# Patient Record
Sex: Male | Born: 1960 | Race: White | Hispanic: No | Marital: Married | Smoking: Former smoker
Health system: Southern US, Community
[De-identification: ages and names within clinical notes are randomized; demographics above are authoritative.]

## PROBLEM LIST (undated history)

## (undated) DIAGNOSIS — E7800 Pure hypercholesterolemia, unspecified: Secondary | ICD-10-CM

## (undated) DIAGNOSIS — J45909 Unspecified asthma, uncomplicated: Secondary | ICD-10-CM

## (undated) DIAGNOSIS — I2510 Atherosclerotic heart disease of native coronary artery without angina pectoris: Secondary | ICD-10-CM

## (undated) DIAGNOSIS — I10 Essential (primary) hypertension: Secondary | ICD-10-CM

## (undated) DIAGNOSIS — M109 Gout, unspecified: Secondary | ICD-10-CM

## (undated) HISTORY — DX: Atherosclerotic heart disease of native coronary artery without angina pectoris: I25.10

---

## 2020-12-18 ENCOUNTER — Other Ambulatory Visit: Payer: Self-pay

## 2020-12-18 ENCOUNTER — Observation Stay (HOSPITAL_BASED_OUTPATIENT_CLINIC_OR_DEPARTMENT_OTHER): Payer: BC Managed Care – PPO

## 2020-12-18 ENCOUNTER — Emergency Department (HOSPITAL_COMMUNITY): Payer: BC Managed Care – PPO

## 2020-12-18 ENCOUNTER — Encounter (HOSPITAL_COMMUNITY): Payer: Self-pay

## 2020-12-18 ENCOUNTER — Inpatient Hospital Stay (HOSPITAL_COMMUNITY)
Admission: EM | Admit: 2020-12-18 | Discharge: 2020-12-21 | DRG: 247 | Disposition: A | Discharge status: Home / Self Care | Payer: BC Managed Care – PPO | Attending: Cardiology | Admitting: Cardiology

## 2020-12-18 DIAGNOSIS — G4733 Obstructive sleep apnea (adult) (pediatric): Secondary | ICD-10-CM | POA: Diagnosis not present

## 2020-12-18 DIAGNOSIS — R079 Chest pain, unspecified: Secondary | ICD-10-CM | POA: Diagnosis not present

## 2020-12-18 DIAGNOSIS — Z87891 Personal history of nicotine dependence: Secondary | ICD-10-CM

## 2020-12-18 DIAGNOSIS — Z8249 Family history of ischemic heart disease and other diseases of the circulatory system: Secondary | ICD-10-CM | POA: Diagnosis not present

## 2020-12-18 DIAGNOSIS — I493 Ventricular premature depolarization: Secondary | ICD-10-CM

## 2020-12-18 DIAGNOSIS — E876 Hypokalemia: Secondary | ICD-10-CM | POA: Diagnosis not present

## 2020-12-18 DIAGNOSIS — E785 Hyperlipidemia, unspecified: Secondary | ICD-10-CM | POA: Diagnosis not present

## 2020-12-18 DIAGNOSIS — I214 Non-ST elevation (NSTEMI) myocardial infarction: Secondary | ICD-10-CM

## 2020-12-18 DIAGNOSIS — Z20822 Contact with and (suspected) exposure to covid-19: Secondary | ICD-10-CM | POA: Diagnosis present

## 2020-12-18 DIAGNOSIS — J45909 Unspecified asthma, uncomplicated: Secondary | ICD-10-CM | POA: Diagnosis not present

## 2020-12-18 DIAGNOSIS — I10 Essential (primary) hypertension: Secondary | ICD-10-CM | POA: Diagnosis not present

## 2020-12-18 DIAGNOSIS — Z955 Presence of coronary angioplasty implant and graft: Secondary | ICD-10-CM

## 2020-12-18 DIAGNOSIS — E7800 Pure hypercholesterolemia, unspecified: Secondary | ICD-10-CM | POA: Diagnosis not present

## 2020-12-18 DIAGNOSIS — R202 Paresthesia of skin: Secondary | ICD-10-CM

## 2020-12-18 DIAGNOSIS — I25110 Atherosclerotic heart disease of native coronary artery with unstable angina pectoris: Secondary | ICD-10-CM

## 2020-12-18 DIAGNOSIS — Z880 Allergy status to penicillin: Secondary | ICD-10-CM | POA: Diagnosis not present

## 2020-12-18 DIAGNOSIS — M109 Gout, unspecified: Secondary | ICD-10-CM | POA: Diagnosis not present

## 2020-12-18 DIAGNOSIS — R072 Precordial pain: Secondary | ICD-10-CM

## 2020-12-18 DIAGNOSIS — R0789 Other chest pain: Secondary | ICD-10-CM | POA: Diagnosis present

## 2020-12-18 HISTORY — DX: Unspecified asthma, uncomplicated: J45.909

## 2020-12-18 HISTORY — DX: Pure hypercholesterolemia, unspecified: E78.00

## 2020-12-18 HISTORY — DX: Essential (primary) hypertension: I10

## 2020-12-18 HISTORY — DX: Gout, unspecified: M10.9

## 2020-12-18 LAB — MAGNESIUM: Magnesium: 2.5 mg/dL — ABNORMAL HIGH (ref 1.7–2.4)

## 2020-12-18 LAB — CBC WITH DIFFERENTIAL/PLATELET
Abs Immature Granulocytes: 0.03 10*3/uL (ref 0.00–0.07)
Basophils Absolute: 0.1 10*3/uL (ref 0.0–0.1)
Basophils Relative: 1 %
Eosinophils Absolute: 0.4 10*3/uL (ref 0.0–0.5)
Eosinophils Relative: 5 %
HCT: 49 % (ref 39.0–52.0)
Hemoglobin: 16.9 g/dL (ref 13.0–17.0)
Immature Granulocytes: 0 %
Lymphocytes Relative: 25 %
Lymphs Abs: 1.9 10*3/uL (ref 0.7–4.0)
MCH: 29.8 pg (ref 26.0–34.0)
MCHC: 34.5 g/dL (ref 30.0–36.0)
MCV: 86.3 fL (ref 80.0–100.0)
Monocytes Absolute: 0.8 10*3/uL (ref 0.1–1.0)
Monocytes Relative: 11 %
Neutro Abs: 4.5 10*3/uL (ref 1.7–7.7)
Neutrophils Relative %: 58 %
Platelets: 142 10*3/uL — ABNORMAL LOW (ref 150–400)
RBC: 5.68 MIL/uL (ref 4.22–5.81)
RDW: 13.5 % (ref 11.5–15.5)
WBC: 7.6 10*3/uL (ref 4.0–10.5)
nRBC: 0 % (ref 0.0–0.2)

## 2020-12-18 LAB — RESP PANEL BY RT-PCR (FLU A&B, COVID) ARPGX2
Influenza A by PCR: NEGATIVE
Influenza B by PCR: NEGATIVE
SARS Coronavirus 2 by RT PCR: NEGATIVE

## 2020-12-18 LAB — ECHOCARDIOGRAM COMPLETE
AR max vel: 2.18 cm2
AV Area VTI: 2.15 cm2
AV Area mean vel: 2.1 cm2
AV Mean grad: 8.5 mmHg
AV Peak grad: 17.7 mmHg
Ao pk vel: 2.11 m/s
Area-P 1/2: 3.33 cm2
Height: 71 in
MV VTI: 2.37 cm2
S' Lateral: 3.4 cm
Weight: 4320 oz

## 2020-12-18 LAB — COMPREHENSIVE METABOLIC PANEL
ALT: 29 U/L (ref 0–44)
AST: 31 U/L (ref 15–41)
Albumin: 4.5 g/dL (ref 3.5–5.0)
Alkaline Phosphatase: 102 U/L (ref 38–126)
Anion gap: 10 (ref 5–15)
BUN: 18 mg/dL (ref 6–20)
CO2: 25 mmol/L (ref 22–32)
Calcium: 9.5 mg/dL (ref 8.9–10.3)
Chloride: 101 mmol/L (ref 98–111)
Creatinine, Ser: 0.9 mg/dL (ref 0.61–1.24)
GFR, Estimated: >60 mL/min (ref >60)
Glucose, Bld: 126 mg/dL — ABNORMAL HIGH (ref 70–99)
Potassium: 3.3 mmol/L — ABNORMAL LOW (ref 3.5–5.1)
Sodium: 136 mmol/L (ref 135–145)
Total Bilirubin: 0.7 mg/dL (ref 0.3–1.2)
Total Protein: 8.2 g/dL — ABNORMAL HIGH (ref 6.5–8.1)

## 2020-12-18 LAB — HIV ANTIBODY (ROUTINE TESTING W REFLEX): HIV Screen 4th Generation wRfx: NONREACTIVE

## 2020-12-18 LAB — TROPONIN I (HIGH SENSITIVITY)
Troponin I (High Sensitivity): 31 ng/L — ABNORMAL HIGH (ref <18)
Troponin I (High Sensitivity): 234 ng/L (ref <18)
Troponin I (High Sensitivity): 601 ng/L (ref <18)
Troponin I (High Sensitivity): 7987 ng/L (ref <18)

## 2020-12-18 LAB — LIPASE, BLOOD: Lipase: 51 U/L (ref 11–51)

## 2020-12-18 MED ORDER — ASPIRIN EC 81 MG PO TBEC
81.0000 mg | DELAYED_RELEASE_TABLET | Freq: Every day | ORAL | Status: DC
  Administered 2020-12-19 – 2020-12-21 (×2): 81 mg via ORAL
  Filled 2020-12-18 (×2): qty 1

## 2020-12-18 MED ORDER — ASPIRIN 325 MG PO TABS
325.0000 mg | ORAL_TABLET | Freq: Once | ORAL | Status: AC
  Administered 2020-12-18: 325 mg via ORAL
  Filled 2020-12-18: qty 1

## 2020-12-18 MED ORDER — HEPARIN BOLUS VIA INFUSION
4000.0000 [IU] | Freq: Once | INTRAVENOUS | Status: AC
  Administered 2020-12-18: 4000 [IU] via INTRAVENOUS

## 2020-12-18 MED ORDER — SODIUM CHLORIDE 0.9% FLUSH
3.0000 mL | Freq: Two times a day (BID) | INTRAVENOUS | Status: DC
  Administered 2020-12-18: 3 mL via INTRAVENOUS

## 2020-12-18 MED ORDER — SODIUM CHLORIDE 0.9 % WEIGHT BASED INFUSION
1.0000 mL/kg/h | INTRAVENOUS | Status: DC
  Administered 2020-12-19: 1 mL/kg/h via INTRAVENOUS

## 2020-12-18 MED ORDER — SODIUM CHLORIDE 0.9 % IV SOLN: 250.0000 mL | INTRAVENOUS | Status: DC | PRN

## 2020-12-18 MED ORDER — ASPIRIN 81 MG PO CHEW
81.0000 mg | CHEWABLE_TABLET | ORAL | Status: AC
  Administered 2020-12-19: 81 mg via ORAL
  Filled 2020-12-18: qty 1

## 2020-12-18 MED ORDER — IOHEXOL 350 MG/ML SOLN
100.0000 mL | Freq: Once | INTRAVENOUS | Status: AC | PRN
  Administered 2020-12-18: 100 mL via INTRAVENOUS

## 2020-12-18 MED ORDER — BENAZEPRIL HCL 20 MG PO TABS
20.0000 mg | ORAL_TABLET | Freq: Every day | ORAL | Status: DC
  Administered 2020-12-19: 20 mg via ORAL
  Filled 2020-12-18 (×3): qty 1

## 2020-12-18 MED ORDER — ACETAMINOPHEN 325 MG PO TABS
650.0000 mg | ORAL_TABLET | ORAL | Status: DC | PRN
  Administered 2020-12-18: 650 mg via ORAL
  Filled 2020-12-18: qty 2

## 2020-12-18 MED ORDER — ALUM & MAG HYDROXIDE-SIMETH 200-200-20 MG/5ML PO SUSP
15.0000 mL | Freq: Once | ORAL | Status: AC
  Administered 2020-12-18: 15 mL via ORAL
  Filled 2020-12-18: qty 30

## 2020-12-18 MED ORDER — ONDANSETRON HCL 4 MG/2ML IJ SOLN
4.0000 mg | Freq: Once | INTRAMUSCULAR | Status: AC
  Administered 2020-12-18: 4 mg via INTRAVENOUS
  Filled 2020-12-18: qty 2

## 2020-12-18 MED ORDER — ALLOPURINOL 300 MG PO TABS
300.0000 mg | ORAL_TABLET | Freq: Every day | ORAL | Status: DC
  Administered 2020-12-19 – 2020-12-21 (×2): 300 mg via ORAL
  Filled 2020-12-18 (×2): qty 1

## 2020-12-18 MED ORDER — SODIUM CHLORIDE 0.9 % WEIGHT BASED INFUSION
3.0000 mL/kg/h | INTRAVENOUS | Status: DC
  Administered 2020-12-19: 3 mL/kg/h via INTRAVENOUS

## 2020-12-18 MED ORDER — ASPIRIN 81 MG PO CHEW: 324.0000 mg | CHEWABLE_TABLET | ORAL | Status: DC

## 2020-12-18 MED ORDER — ONDANSETRON HCL 4 MG/2ML IJ SOLN: 4.0000 mg | Freq: Four times a day (QID) | INTRAMUSCULAR | Status: DC | PRN

## 2020-12-18 MED ORDER — NITROGLYCERIN 0.4 MG SL SUBL: 0.4000 mg | SUBLINGUAL_TABLET | SUBLINGUAL | Status: DC | PRN

## 2020-12-18 MED ORDER — ASPIRIN 81 MG PO CHEW
324.0000 mg | CHEWABLE_TABLET | Freq: Once | ORAL | Status: AC
  Administered 2020-12-18: 324 mg via ORAL
  Filled 2020-12-18: qty 4

## 2020-12-18 MED ORDER — HEPARIN (PORCINE) 25000 UT/250ML-% IV SOLN
1400.0000 [IU]/h | INTRAVENOUS | Status: DC
  Administered 2020-12-18: 1250 [IU]/h via INTRAVENOUS
  Filled 2020-12-18 (×2): qty 250

## 2020-12-18 MED ORDER — POTASSIUM CHLORIDE CRYS ER 20 MEQ PO TBCR
40.0000 meq | EXTENDED_RELEASE_TABLET | Freq: Every day | ORAL | Status: AC
  Administered 2020-12-18 – 2020-12-19 (×2): 40 meq via ORAL
  Filled 2020-12-18 (×3): qty 2

## 2020-12-18 MED ORDER — SODIUM CHLORIDE 0.9% FLUSH: 3.0000 mL | INTRAVENOUS | Status: DC | PRN

## 2020-12-18 MED ORDER — ASPIRIN 300 MG RE SUPP: 300.0000 mg | RECTAL | Status: DC

## 2020-12-18 MED ORDER — PANTOPRAZOLE SODIUM 40 MG IV SOLR
40.0000 mg | Freq: Once | INTRAVENOUS | Status: AC
  Administered 2020-12-18: 40 mg via INTRAVENOUS
  Filled 2020-12-18: qty 40

## 2020-12-18 MED ORDER — AMLODIPINE BESYLATE 10 MG PO TABS
10.0000 mg | ORAL_TABLET | Freq: Every day | ORAL | Status: DC
  Administered 2020-12-19: 10 mg via ORAL
  Filled 2020-12-18: qty 1

## 2020-12-18 MED ORDER — ATORVASTATIN CALCIUM 40 MG PO TABS
40.0000 mg | ORAL_TABLET | Freq: Every day | ORAL | Status: DC
  Administered 2020-12-18: 40 mg via ORAL
  Filled 2020-12-18: qty 1

## 2020-12-18 NOTE — ED Notes (Signed)
Nurse continues to get a hold of Dr for troponin results, via chat, and paging .

## 2020-12-18 NOTE — Progress Notes (Signed)
ANTICOAGULATION CONSULT NOTE - Initial Consult  Pharmacy Consult for heparin Indication: chest pain/ACS/STEMI  Allergies  Allergen Reactions   Penicillins     Mouth swells and gets short of breath    Patient Measurements: Height: 5\' 11"  (180.3 cm) Weight: 122.5 kg (270 lb) IBW/kg (Calculated) : 75.3 HEPARIN DW (KG): 102.6   Vital Signs: Temp: 97.9 F (36.6 C) (07/11 0928) Temp Source: Oral (07/11 0928) BP: 177/99 (07/11 1430) Pulse Rate: 51 (07/11 1430)  Labs: Recent Labs    12/18/20 0950 12/18/20 1345  HGB 16.9  --   HCT 49.0  --   PLT 142*  --   CREATININE 0.90  --   TROPONINIHS 31* 234*    Estimated Creatinine Clearance: 117.8 mL/min (by C-G formula based on SCr of 0.9 mg/dL).   Medical History: Past Medical History:  Diagnosis Date   Asthma    Gout    Hypercholesterolemia    Hypertension     Medications:  See med rec  Assessment: Nicholas Andersen 60 yo male history of hyperlipidemia, HTN, former tobacco, family history of MI in several paternal uncles presents with chest pain. He has chest tightness with feeling very clammy, numbness radiating down his right arm, some associated SOB. Patient not on any oral anticoagulants. Pharmacy asked to start heparin  Goal of Therapy:  Heparin level 0.3-0.7 units/ml Monitor platelets by anticoagulation protocol: Yes   Plan:  Give 4000 units bolus x 1 Start heparin infusion at 1250 units/hr Check anti-Xa level in ~6 hours and daily while on heparin Continue to monitor H&H and platelets  46, BS Elder Cyphers, BCPS Clinical Pharmacist Pager [phone] 12/18/2020,3:40 PM

## 2020-12-18 NOTE — Progress Notes (Signed)
  Echocardiogram 2D Echocardiogram has been performed.  Carolyne Fiscal 12/18/2020, 4:30 PM

## 2020-12-18 NOTE — ED Notes (Addendum)
Date and time results received: 12/18/20 & 1438   Test: troponin  Critical Value: 234  Name of Provider Notified: Dr. Jacqulyn Bath   Orders Received? Or Actions Taken?: notified, cardiology paged

## 2020-12-18 NOTE — ED Notes (Signed)
Dr. Wyline Mood to return call to Dr. Jacqulyn Bath

## 2020-12-18 NOTE — Plan of Care (Signed)
  Problem: Education: Goal: Knowledge of General Education information will improve Description Including pain rating scale, medication(s)/side effects and non-pharmacologic comfort measures Outcome: Progressing   

## 2020-12-18 NOTE — ED Triage Notes (Signed)
Pt woke up at 3 am with indigestion and took 2 tums and his bp meds. Pt did go back to sleep . Pt woke up this morning and ate breakfast . After breakfast pt began tingling in arms, pain in center chest , back right shoulder blades hurt. Pt has excessive indigestion at this time.

## 2020-12-18 NOTE — ED Notes (Signed)
Echo in process at this time 

## 2020-12-18 NOTE — ED Notes (Signed)
Date and time results received: 12/18/20 & 1820   Test: troponin  Critical Value: 601  Name of Provider Notified:  Dr. Effie Shy  Orders Received? Or Actions Taken?: Notified

## 2020-12-18 NOTE — H&P (Signed)
Cardiology Consultation:   Patient ID: Nicholas Andersen MRN: [id number]; DOB: 04-Oct-1960  Admit date: 12/18/2020 Date of Consult: 12/18/2020  PCP:  System, Provider Not In   [facility] Providers Cardiologist:  None      Patient Profile:   Nicholas Andersen is a 60 y.o. male with a hx of HTN, hyperlipidemia, prior tobacco history who is being seen 12/18/2020 for the evaluation of chest pain at the request of Dr Jacqulyn Bath.  History of Present Illness:   Nicholas Andersen 60 yo male history of hyperlipidemia, HTN, former tobacco, family history of MI in several paternal uncles presents with chest pain  Symptoms started around 3AM. Initially burning like pain midchest, felt clammy. He thought it was heart burn, took some tums and symptoms eased off over time, went back to sleep. Up again few hours later with same symptoms, took a protonix and waited some time and symptoms resolved, went back to bed. Woke up, went to breakfast with friends, Big breakfast with eggs/ham etc. Afterwards started having frequent belching. Later had new onset of a 5/10 chest tightness with feeling very clammy, numbness radiating down his right arm, some associated SOB.    K 3.3 Cr 0.90 BUN 18 Lipase 51 WBC 7.6 Hgb 16.9 Plt 142 Trop 31-->234   EKG SR, PVCs CXR no acute process CT head negative CTA Chest no dissection Past Medical History:  Diagnosis Date   Asthma    Gout    Hypercholesterolemia    Hypertension     History reviewed. No pertinent surgical history.    Inpatient Medications: Scheduled Meds:  Continuous Infusions:  PRN Meds:   Allergies:    Allergies  Allergen Reactions   Penicillins     Mouth swells and gets short of breath    Social History:   Social History   Socioeconomic History   Marital status: Married    Spouse name: Not on file   Number of children: Not on file   Years of education: Not on file   Highest education level: Not on file  Occupational History   Not on file   Tobacco Use   Smoking status: Former    Packs/day: 1.00    Years: 15.00    Pack years: 15.00    Types: Cigarettes    Quit date: 1990    Years since quitting: 32.5    Passive exposure: Never   Smokeless tobacco: Never  Vaping Use   Vaping Use: Never used  Substance and Sexual Activity   Alcohol use: Yes    Alcohol/week: 10.0 standard drinks    Types: 10 Cans of beer per week    Comment: weekly   Drug use: Not Currently   Sexual activity: Yes    Birth control/protection: None  Other Topics Concern   Not on file  Social History Narrative   Not on file   Social Determinants of Health   Financial Resource Strain: Not on file  Food Insecurity: Not on file  Transportation Needs: Not on file  Physical Activity: Not on file  Stress: Not on file  Social Connections: Not on file  Intimate Partner Violence: Not on file    Family History:   Multiple paternal uncles with MIs in the early 68s  ROS:  Please see the history of present illness.  All other ROS reviewed and negative.     Physical Exam/Data:   Vitals:   12/18/20 1230 12/18/20 1300 12/18/20 1400 12/18/20 1430  BP: (!) 172/93 (!) 160/101 (!) 152/85 Marland Kitchen)  177/99  Pulse: (!) 58 (!) 57 (!) 53 (!) 51  Resp: 11 13 17 12   Temp:      TempSrc:      SpO2: 99% 94% 97% 99%  Weight:      Height:       No intake or output data in the 24 hours ending 12/18/20 1539 Last 3 Weights 12/18/2020  Weight (lbs) 270 lb  Weight (kg) 122.471 kg     Body mass index is 37.66 kg/m.  General:  Well nourished, well developed, in no acute distress HEENT: normal Lymph: no adenopathy Neck: no JVD Endocrine:  No thryomegaly Vascular: No carotid bruits; FA pulses 2+ bilaterally without bruits  Cardiac:  normal S1, S2; RRR; 2/6 systolic murmur apex Lungs:  clear to auscultation bilaterally, no wheezing, rhonchi or rales  Abd: soft, nontender, no hepatomegaly  Ext: no edema Musculoskeletal:  No deformities, BUE and BLE strength normal and  equal Skin: warm and dry  Neuro:  CNs 2-12 intact, no focal abnormalities noted Psych:  Normal affect      Laboratory Data:  High Sensitivity Troponin:   Recent Labs  Lab 12/18/20 0950 12/18/20 1345  TROPONINIHS 31* 234*     Chemistry Recent Labs  Lab 12/18/20 0950  NA 136  K 3.3*  CL 101  CO2 25  GLUCOSE 126*  BUN 18  CREATININE 0.90  CALCIUM 9.5  GFRNONAA >60  ANIONGAP 10    Recent Labs  Lab 12/18/20 0950  PROT 8.2*  ALBUMIN 4.5  AST 31  ALT 29  ALKPHOS 102  BILITOT 0.7   Hematology Recent Labs  Lab 12/18/20 0950  WBC 7.6  RBC 5.68  HGB 16.9  HCT 49.0  MCV 86.3  MCH 29.8  MCHC 34.5  RDW 13.5  PLT 142*   BNPNo results for input(s): BNP, PROBNP in the last 168 hours.  DDimer No results for input(s): DDIMER in the last 168 hours.   Radiology/Studies:  CT Head Wo Contrast  Result Date: 12/18/2020 CLINICAL DATA:  Bilateral arm tingling. EXAM: CT HEAD WITHOUT CONTRAST TECHNIQUE: Contiguous axial images were obtained from the base of the skull through the vertex without intravenous contrast. COMPARISON:  None. FINDINGS: Brain: No evidence of acute infarction, hemorrhage, hydrocephalus, extra-axial collection or mass lesion/mass effect. Mild generalized cerebral atrophy. Vascular: Atherosclerotic vascular calcification of the carotid siphons. No hyperdense vessel. Skull: Normal. Negative for fracture or focal lesion. Sinuses/Orbits: Extensive bilateral paranasal sinus disease. The orbits are unremarkable. Other: None. IMPRESSION: 1. No acute intracranial abnormality. 2. Extensive bilateral paranasal sinus disease. Electronically Signed   By: Obie DredgeWilliam T Derry M.D.   On: 12/18/2020 12:14   DG Chest Portable 1 View  Result Date: 12/18/2020 CLINICAL DATA:  Chest pain, pain in center of chest with bilateral arm tingling. EXAM: PORTABLE CHEST 1 VIEW COMPARISON:  None FINDINGS: EKG leads project over the chest. Trachea is midline. Cardiomediastinal contours and  hilar structures are normal. Lungs are clear.  No sign of effusion or pneumothorax. On limited assessment there is no acute skeletal process. IMPRESSION: No acute cardiopulmonary disease. Electronically Signed   By: Donzetta KohutGeoffrey  Wile M.D.   On: 12/18/2020 10:08   CT Angio Chest/Abd/Pel for Dissection W and/or Wo Contrast  Result Date: 12/18/2020 CLINICAL DATA:  Acute onset central chest and right shoulder blade pain. EXAM: CT ANGIOGRAPHY CHEST, ABDOMEN AND PELVIS TECHNIQUE: Non-contrast CT of the chest was initially obtained. Multidetector CT imaging through the chest, abdomen and pelvis was performed using the standard  protocol during bolus administration of intravenous contrast. Multiplanar reconstructed images and MIPs were obtained and reviewed to evaluate the vascular anatomy. CONTRAST:  OMNIPAQUE IOHEXOL 350 MG/ML SOLN COMPARISON:  Chest x-ray from same day. FINDINGS: CTA CHEST FINDINGS Cardiovascular: Preferential opacification of the thoracic aorta. No evidence of thoracic aortic aneurysm or dissection. Normal heart size. Three-vessel coronary artery calcification. No pericardial effusion. No central pulmonary embolism. Mediastinum/Nodes: No enlarged mediastinal, hilar, or axillary lymph nodes. Thyroid gland, trachea, and esophagus demonstrate no significant findings. Lungs/Pleura: Lungs are clear. No pleural effusion or pneumothorax. 4 mm subpleural nodule in the superior segment of the left lower lobe (series 9, image 74). Musculoskeletal: No chest wall abnormality. No acute or significant osseous findings. Review of the MIP images confirms the above findings. CTA ABDOMEN AND PELVIS FINDINGS VASCULAR Aorta: Normal caliber aorta without aneurysm, dissection, vasculitis or significant stenosis. Mild atherosclerotic calcification. Celiac: Patent. 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 SMA: Patent without evidence of aneurysm, dissection,  vasculitis or significant stenosis. Renals: Both renal arteries are patent without evidence of aneurysm, dissection, vasculitis, fibromuscular dysplasia or significant stenosis. IMA: Patent without evidence of aneurysm, dissection, vasculitis or significant stenosis. Inflow: Patent without evidence of aneurysm, dissection, vasculitis or significant stenosis. Veins: No obvious venous abnormality within the limitations of this arterial phase study. Review of the MIP images confirms the above findings. NON-VASCULAR Hepatobiliary: Diffusely decreased liver density without focal abnormality. The gallbladder is unremarkable. No biliary dilatation. Pancreas: Unremarkable. No pancreatic ductal dilatation or surrounding inflammatory changes. Spleen: Normal in size without focal abnormality. Adrenals/Urinary Tract: The adrenal glands are unremarkable. 1.2 cm simple cyst in the central midpole of the right kidney. No renal calculi or hydronephrosis. Mild circumferential bladder wall thickening is likely related to under distension. Stomach/Bowel: Stomach is within normal limits. Appendix appears normal. No evidence of bowel wall thickening, distention, or inflammatory changes. Few scattered colonic diverticula. Lymphatic: No enlarged abdominal or pelvic lymph nodes. Reproductive: Prostate is unremarkable. Other: No abdominal wall hernia or abnormality. No abdominopelvic ascites. No pneumoperitoneum. Musculoskeletal: No acute or significant osseous findings. Review of the MIP images confirms the above findings. IMPRESSION: Vascular: 1. No evidence of aortic dissection or aneurysm. 2. 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 3. Aortic Atherosclerosis (ICD10-I70.0). Chest: 1.  No acute intrathoracic process. 2. 4 mm subpleural nodule in the left lower lobe. No follow-up needed if patient is low-risk. Non-contrast chest CT can be considered in 12  months if patient is high-risk. This recommendation follows the consensus statement: Guidelines for Management of Incidental Pulmonary Nodules Detected on CT Images: From the Fleischner Society 2017; Radiology 2017; 284:228-243. Abdomen pelvis: 1.  No acute intra-abdominal process. 2. Hepatic steatosis. Electronically Signed   By: Obie Dredge M.D.   On: 12/18/2020 12:27     Assessment and Plan:   Chest pain/elevated troponin/NSTEMI - initial symptoms were more suggestive of GI etiology with burning midchest pain, frequent belching - later symptoms of central chest tightness, SOB, diaphoretic more worrisome for potential cardiac etiology, particularly given the delta of his troponin. Multiple CAD risk factors - plan for transfer to [facility], cath tomorrow  Medical therapy with ASA, atorvastatin, no beta blocker due to HRs mid 50s, conitnue his home CCB/ACEI, start hep gtt.  - order Echo  2. PVCs - replace KCl, check Mg  - pending cath for potential ischemia  3.Hypokalemia - oral KCl x 2 doses, add Mg to labs   Risk Assessment/Risk Scores:     TIMI Risk Score for Unstable Angina or Non-ST Elevation MI:   The patient's TIMI risk score is 4, which indicates a 20% risk of all cause mortality, new or recurrent myocardial infarction or need for urgent revascularization in the next 14 days.          For questions or updates, please contact [facility] Please consult [email or web address] for contact info under    Signed, Dina Rich, MD  12/18/2020 3:39 PM

## 2020-12-18 NOTE — ED Provider Notes (Signed)
Emergency Department Provider Note   I have reviewed the triage vital signs and the nursing notes.   HISTORY  Chief Complaint Chest Pain   HPI Nicholas Andersen is a 59 y.o. male with past medical history reviewed below presents to the emergency department with acute onset discomfort in the right chest with some subjective numbness in the right forearm.  Patient states he awoke early this morning with belching but no pain.  He continued to have frequent belching this morning and developed pain shortly after eating a bacon egg and cheese biscuit.  He is having pain into his chest which feels like "tightness" along with some mid epigastric discomfort.  No shortness of breath.  No numbness to the face or right leg.  No prior history of similar pain symptoms.  No fevers or chills.  Patient states he was planning on playing golf this morning but when pain developed into the chest he was concerned and presents to the ED for evaluation.    Past Medical History:  Diagnosis Date   Asthma    Gout    Hypercholesterolemia    Hypertension     Patient Active Problem List   Diagnosis Date Noted   NSTEMI (non-ST elevated myocardial infarction) (HCC) 12/18/2020    History reviewed. No pertinent surgical history.  Allergies Penicillins  No family history on file.  Social History Social History   Tobacco Use   Smoking status: Former    Packs/day: 1.00    Years: 15.00    Pack years: 15.00    Types: Cigarettes    Quit date: 1990    Years since quitting: 32.5    Passive exposure: Never   Smokeless tobacco: Never  Vaping Use   Vaping Use: Never used  Substance Use Topics   Alcohol use: Yes    Alcohol/week: 10.0 standard drinks    Types: 10 Cans of beer per week    Comment: weekly   Drug use: Not Currently    Review of Systems  Constitutional: No fever/chills Eyes: No visual changes. ENT: No sore throat. Cardiovascular: Positive chest pain. Respiratory: Denies shortness of  breath. Gastrointestinal: Positive mid-epigastric abdominal pain.  Positive nausea, no vomiting.  No diarrhea.  No constipation. Positive frequent belching.  Genitourinary: Negative for dysuria. Musculoskeletal: Negative for back pain. Skin: Negative for rash. Neurological: Negative for headaches, focal weakness or numbness.  10-point ROS otherwise negative.  ____________________________________________   PHYSICAL EXAM:  VITAL SIGNS: ED Triage Vitals  Enc Vitals Group     BP 12/18/20 0922 (!) 181/98     Pulse Rate 12/18/20 0922 60     Resp 12/18/20 0922 13     Temp 12/18/20 0928 97.9 F (36.6 C)     Temp Source 12/18/20 0928 Oral     SpO2 12/18/20 0922 99 %     Weight 12/18/20 0932 270 lb (122.5 kg)     Height 12/18/20 0932  (1.803 m)    Constitutional: Alert and oriented. Well appearing and in no acute distress. Frequent belching noted.  Eyes: Conjunctivae are normal.  Head: Atraumatic. Nose: No congestion/rhinnorhea. Mouth/Throat: Mucous membranes are moist.  Neck: No stridor.  Cardiovascular: Normal rate, regular rhythm. Good peripheral circulation. Grossly normal heart sounds.   Respiratory: Normal respiratory effort.  No retractions. Lungs CTAB. Gastrointestinal: Soft with mild mid-epigastric tenderness. No peritoneal findings. No significant tenderness in the RUQ. No distention.  Musculoskeletal: No lower extremity tenderness nor edema. No gross deformities of extremities. Neurologic:  Normal  speech and language. No gross focal neurologic deficits are appreciated.  Skin:  Skin is warm, dry and intact. No rash noted.   ____________________________________________   LABS (all labs ordered are listed, but only abnormal results are displayed)  Labs Reviewed  COMPREHENSIVE METABOLIC PANEL - Abnormal; Notable for the following components:      Result Value   Potassium 3.3 (*)    Glucose, Bld 126 (*)    Total Protein 8.2 (*)    All other components within  normal limits  CBC WITH DIFFERENTIAL/PLATELET - Abnormal; Notable for the following components:   Platelets 142 (*)    All other components within normal limits  TROPONIN I (HIGH SENSITIVITY) - Abnormal; Notable for the following components:   Troponin I (High Sensitivity) 31 (*)    All other components within normal limits  TROPONIN I (HIGH SENSITIVITY) - Abnormal; Notable for the following components:   Troponin I (High Sensitivity) 234 (*)    All other components within normal limits  RESP PANEL BY RT-PCR (FLU A&B, COVID) ARPGX2  LIPASE, BLOOD  MAGNESIUM  HIV ANTIBODY (ROUTINE TESTING W REFLEX)  HEPARIN LEVEL (UNFRACTIONATED)  TROPONIN I (HIGH SENSITIVITY)  TROPONIN I (HIGH SENSITIVITY)   ____________________________________________  EKG   EKG Interpretation  Date/Time:  Monday December 18 2020 09:25:45 EDT Ventricular Rate:  67 PR Interval:  200 QRS Duration: 95 QT Interval:  403 QTC Calculation: 426 R Axis:   37 Text Interpretation: Sinus rhythm Multiple ventricular premature complexes Low voltage, precordial leads Borderline repolarization abnormality Baseline wander in lead(s) V3 No old tracing for comparison Confirmed by Alona Bene [phone]) on 12/18/2020 9:30:51 AM        Repeat EKG:  EKG Interpretation  Date/Time:  Monday December 18 2020 14:56:58 EDT Ventricular Rate:  57 PR Interval:  191 QRS Duration: 92 QT Interval:  455 QTC Calculation: 443 R Axis:   22 Text Interpretation: Sinus rhythm Confirmed by Alona Bene [phone]) on 12/18/2020 3:49:30 PM          ____________________________________________  RADIOLOGY  CT Head Wo Contrast  Result Date: 12/18/2020 CLINICAL DATA:  Bilateral arm tingling. EXAM: CT HEAD WITHOUT CONTRAST TECHNIQUE: Contiguous axial images were obtained from the base of the skull through the vertex without intravenous contrast. COMPARISON:  None. FINDINGS: Brain: No evidence of acute infarction, hemorrhage, hydrocephalus, extra-axial  collection or mass lesion/mass effect. Mild generalized cerebral atrophy. Vascular: Atherosclerotic vascular calcification of the carotid siphons. No hyperdense vessel. Skull: Normal. Negative for fracture or focal lesion. Sinuses/Orbits: Extensive bilateral paranasal sinus disease. The orbits are unremarkable. Other: None. IMPRESSION: 1. No acute intracranial abnormality. 2. Extensive bilateral paranasal sinus disease. Electronically Signed   By: Obie Dredge M.D.   On: 12/18/2020 12:14   DG Chest Portable 1 View  Result Date: 12/18/2020 CLINICAL DATA:  Chest pain, pain in center of chest with bilateral arm tingling. EXAM: PORTABLE CHEST 1 VIEW COMPARISON:  None FINDINGS: EKG leads project over the chest. Trachea is midline. Cardiomediastinal contours and hilar structures are normal. Lungs are clear.  No sign of effusion or pneumothorax. On limited assessment there is no acute skeletal process. IMPRESSION: No acute cardiopulmonary disease. Electronically Signed   By: Donzetta Kohut M.D.   On: 12/18/2020 10:08   CT Angio Chest/Abd/Pel for Dissection W and/or Wo Contrast  Result Date: 12/18/2020 CLINICAL DATA:  Acute onset central chest and right shoulder blade pain. EXAM: CT ANGIOGRAPHY CHEST, ABDOMEN AND PELVIS TECHNIQUE: Non-contrast CT of the chest was initially obtained.  Multidetector CT imaging through the chest, abdomen and pelvis was performed using the standard protocol during bolus administration of intravenous contrast. Multiplanar reconstructed images and MIPs were obtained and reviewed to evaluate the vascular anatomy. CONTRAST:  100mL OMNIPAQUE IOHEXOL 350 MG/ML SOLN COMPARISON:  Chest x-ray from same day. FINDINGS: CTA CHEST FINDINGS Cardiovascular: Preferential opacification of the thoracic aorta. No evidence of thoracic aortic aneurysm or dissection. Normal heart size. Three-vessel coronary artery calcification. No pericardial effusion. No central pulmonary embolism. Mediastinum/Nodes: No  enlarged mediastinal, hilar, or axillary lymph nodes. Thyroid gland, trachea, and esophagus demonstrate no significant findings. Lungs/Pleura: Lungs are clear. No pleural effusion or pneumothorax. 4 mm subpleural nodule in the superior segment of the left lower lobe (series 9, image 74). Musculoskeletal: No chest wall abnormality. No acute or significant osseous findings. Review of the MIP images confirms the above findings. CTA ABDOMEN AND PELVIS FINDINGS VASCULAR Aorta: Normal caliber aorta without aneurysm, dissection, vasculitis or significant stenosis. Mild atherosclerotic calcification. Celiac: Patent. 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 SMA: Patent without evidence of aneurysm, dissection, vasculitis or significant stenosis. Renals: Both renal arteries are patent without evidence of aneurysm, dissection, vasculitis, fibromuscular dysplasia or significant stenosis. IMA: Patent without evidence of aneurysm, dissection, vasculitis or significant stenosis. Inflow: Patent without evidence of aneurysm, dissection, vasculitis or significant stenosis. Veins: No obvious venous abnormality within the limitations of this arterial phase study. Review of the MIP images confirms the above findings. NON-VASCULAR Hepatobiliary: Diffusely decreased liver density without focal abnormality. The gallbladder is unremarkable. No biliary dilatation. Pancreas: Unremarkable. No pancreatic ductal dilatation or surrounding inflammatory changes. Spleen: Normal in size without focal abnormality. Adrenals/Urinary Tract: The adrenal glands are unremarkable. 1.2 cm simple cyst in the central midpole of the right kidney. No renal calculi or hydronephrosis. Mild circumferential bladder wall thickening is likely related to under distension. Stomach/Bowel: Stomach is within normal limits. Appendix appears normal. No evidence of bowel wall thickening, distention, or inflammatory  changes. Few scattered colonic diverticula. Lymphatic: No enlarged abdominal or pelvic lymph nodes. Reproductive: Prostate is unremarkable. Other: No abdominal wall hernia or abnormality. No abdominopelvic ascites. No pneumoperitoneum. Musculoskeletal: No acute or significant osseous findings. Review of the MIP images confirms the above findings. IMPRESSION: Vascular: 1. No evidence of aortic dissection or aneurysm. 2. 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 3. Aortic Atherosclerosis (ICD10-I70.0). Chest: 1.  No acute intrathoracic process. 2. 4 mm subpleural nodule in the left lower lobe. No follow-up needed if patient is low-risk. Non-contrast chest CT can be considered in 12 months if patient is high-risk. This recommendation follows the consensus statement: Guidelines for Management of Incidental Pulmonary Nodules Detected on CT Images: From the Fleischner Society 2017; Radiology 2017; 284:228-243. Abdomen pelvis: 1.  No acute intra-abdominal process. 2. Hepatic steatosis. Electronically Signed   By: Obie DredgeWilliam T Derry M.D.   On: 12/18/2020 12:27    ____________________________________________   PROCEDURES  Procedure(s) performed:   Procedures  CRITICAL CARE Performed by: Maia PlanJoshua G Charniece Venturino Total critical care time: 35 minutes Critical care time was exclusive of separately billable procedures and treating other patients. Critical care was necessary to treat or prevent imminent or life-threatening deterioration. Critical care was time spent personally by me on the following activities: development of treatment plan with patient and/or surrogate as well as nursing, discussions with consultants, evaluation of patient's response to treatment, examination of patient, obtaining history  from patient or surrogate, ordering and performing treatments and interventions, ordering and review of laboratory studies, ordering and review of  radiographic studies, pulse oximetry and re-evaluation of patient's condition.  Alona Bene, MD Emergency Medicine  ____________________________________________   INITIAL IMPRESSION / ASSESSMENT AND PLAN / ED COURSE  Pertinent labs & imaging results that were available during my care of the patient were reviewed by me and considered in my medical decision making (see chart for details).   Patient presents emergency department with chest and midepigastric abdominal pain with frequent belching.  Pain symptoms began after eating a bacon egg and cheese biscuit but had belching through the night.  Abdomen is fairly benign to deep palpation.  Pain by description seems concerning for possible ACS although the associated belching symptoms point to a more GI type source.  He is also having some numbing feeling in the right forearm.  Doubt stroke with other symptoms.  Dissection was considered although pain is atypical for this.  Plan for chest x-ray along with screening blood work.  His EKG is interpreted by me as above.  No prior tracings for comparison.   Labs reviewed.  Patient's repeat troponin has come back elevated at 234.  Discussed the case with Dr. Wyline Mood.  He will admit and sent to [facility] for heart cath.  Patient's pain is controlled at this time.  Lipase and LFTs are normal.  CTA of the chest, abdomen, pelvis shows no evidence of dissection or other acute finding to explain the patient's symptoms.  CT of the head obtained with some numbness in the right forearm but suspect this may be cardiac in nature rather than neurologic.   Discussed patient's case with Cardiology to request admission. Patient and family (if present) updated with plan. Care transferred to Cardiology service.  I reviewed all nursing notes, vitals, pertinent old records, EKGs, labs, imaging (as available).  ____________________________________________  FINAL CLINICAL IMPRESSION(S) / ED DIAGNOSES  Final diagnoses:   Precordial chest pain  Paresthesias     MEDICATIONS GIVEN DURING THIS VISIT:  Medications  potassium chloride SA (KLOR-CON) CR tablet 40 mEq (40 mEq Oral Given 12/18/20 1530)  aspirin EC tablet 81 mg (has no administration in time range)  nitroGLYCERIN (NITROSTAT) SL tablet 0.4 mg (has no administration in time range)  acetaminophen (TYLENOL) tablet 650 mg (has no administration in time range)  ondansetron (ZOFRAN) injection 4 mg (has no administration in time range)  allopurinol (ZYLOPRIM) tablet 300 mg (has no administration in time range)  amLODipine (NORVASC) tablet 10 mg (has no administration in time range)    And  benazepril (LOTENSIN) tablet 20 mg (has no administration in time range)  atorvastatin (LIPITOR) tablet 40 mg (has no administration in time range)  heparin bolus via infusion 4,000 Units (has no administration in time range)    Followed by  heparin ADULT infusion 100 units/mL (25000 units/242mL) (has no administration in time range)  alum & mag hydroxide-simeth (MAALOX/MYLANTA) 200-200-20 MG/5ML suspension 15 mL (15 mLs Oral Given 12/18/20 1008)  ondansetron (ZOFRAN) injection 4 mg (4 mg Intravenous Given 12/18/20 1008)  aspirin tablet 325 mg (325 mg Oral Given 12/18/20 1008)  iohexol (OMNIPAQUE) 350 MG/ML injection 100 mL (100 mLs Intravenous Contrast Given 12/18/20 1145)  pantoprazole (PROTONIX) injection 40 mg (40 mg Intravenous Given 12/18/20 1255)     Note:  This document was prepared using Dragon voice recognition software and may include unintentional dictation errors.  Alona Bene, MD, [facility] Emergency Medicine    Zyler Hyson,  Arlyss Repress, MD 12/18/20 [phone]

## 2020-12-19 ENCOUNTER — Other Ambulatory Visit: Payer: Self-pay

## 2020-12-19 ENCOUNTER — Encounter (HOSPITAL_COMMUNITY): Payer: Self-pay | Admitting: Cardiology

## 2020-12-19 ENCOUNTER — Encounter (HOSPITAL_COMMUNITY)
Admission: EM | Disposition: A | Discharge status: Home / Self Care | Payer: Self-pay | Source: Home / Self Care | Attending: Internal Medicine

## 2020-12-19 DIAGNOSIS — E785 Hyperlipidemia, unspecified: Secondary | ICD-10-CM | POA: Diagnosis present

## 2020-12-19 DIAGNOSIS — I214 Non-ST elevation (NSTEMI) myocardial infarction: Secondary | ICD-10-CM | POA: Diagnosis present

## 2020-12-19 DIAGNOSIS — I10 Essential (primary) hypertension: Secondary | ICD-10-CM

## 2020-12-19 DIAGNOSIS — Z87891 Personal history of nicotine dependence: Secondary | ICD-10-CM | POA: Diagnosis not present

## 2020-12-19 DIAGNOSIS — Z880 Allergy status to penicillin: Secondary | ICD-10-CM | POA: Diagnosis not present

## 2020-12-19 DIAGNOSIS — Z8249 Family history of ischemic heart disease and other diseases of the circulatory system: Secondary | ICD-10-CM | POA: Diagnosis not present

## 2020-12-19 DIAGNOSIS — I25110 Atherosclerotic heart disease of native coronary artery with unstable angina pectoris: Secondary | ICD-10-CM

## 2020-12-19 DIAGNOSIS — G4733 Obstructive sleep apnea (adult) (pediatric): Secondary | ICD-10-CM

## 2020-12-19 DIAGNOSIS — E7800 Pure hypercholesterolemia, unspecified: Secondary | ICD-10-CM | POA: Diagnosis present

## 2020-12-19 DIAGNOSIS — E7849 Other hyperlipidemia: Secondary | ICD-10-CM | POA: Diagnosis not present

## 2020-12-19 DIAGNOSIS — I493 Ventricular premature depolarization: Secondary | ICD-10-CM | POA: Diagnosis present

## 2020-12-19 DIAGNOSIS — E876 Hypokalemia: Secondary | ICD-10-CM | POA: Diagnosis present

## 2020-12-19 DIAGNOSIS — Z20822 Contact with and (suspected) exposure to covid-19: Secondary | ICD-10-CM | POA: Diagnosis present

## 2020-12-19 DIAGNOSIS — R072 Precordial pain: Secondary | ICD-10-CM

## 2020-12-19 DIAGNOSIS — M109 Gout, unspecified: Secondary | ICD-10-CM | POA: Diagnosis present

## 2020-12-19 DIAGNOSIS — J45909 Unspecified asthma, uncomplicated: Secondary | ICD-10-CM | POA: Diagnosis present

## 2020-12-19 DIAGNOSIS — R0789 Other chest pain: Secondary | ICD-10-CM | POA: Diagnosis present

## 2020-12-19 HISTORY — PX: LEFT HEART CATH AND CORONARY ANGIOGRAPHY: CATH118249

## 2020-12-19 LAB — CBC
HCT: 45.5 % (ref 39.0–52.0)
HCT: 48.4 % (ref 39.0–52.0)
Hemoglobin: 15.5 g/dL (ref 13.0–17.0)
Hemoglobin: 16.4 g/dL (ref 13.0–17.0)
MCH: 29 pg (ref 26.0–34.0)
MCH: 29.4 pg (ref 26.0–34.0)
MCHC: 33.9 g/dL (ref 30.0–36.0)
MCHC: 34.1 g/dL (ref 30.0–36.0)
MCV: 85.5 fL (ref 80.0–100.0)
MCV: 86.2 fL (ref 80.0–100.0)
Platelets: 120 10*3/uL — ABNORMAL LOW (ref 150–400)
Platelets: 145 10*3/uL — ABNORMAL LOW (ref 150–400)
RBC: 5.28 MIL/uL (ref 4.22–5.81)
RBC: 5.66 MIL/uL (ref 4.22–5.81)
RDW: 13.6 % (ref 11.5–15.5)
RDW: 13.6 % (ref 11.5–15.5)
WBC: 6.5 10*3/uL (ref 4.0–10.5)
WBC: 9 10*3/uL (ref 4.0–10.5)
nRBC: 0 % (ref 0.0–0.2)
nRBC: 0 % (ref 0.0–0.2)

## 2020-12-19 LAB — BASIC METABOLIC PANEL
Anion gap: 8 (ref 5–15)
Anion gap: 7 (ref 5–15)
Anion gap: 9 (ref 5–15)
BUN: 11 mg/dL (ref 6–20)
BUN: 7 mg/dL (ref 6–20)
BUN: 11 mg/dL (ref 6–20)
CO2: 26 mmol/L (ref 22–32)
CO2: 26 mmol/L (ref 22–32)
CO2: 24 mmol/L (ref 22–32)
Calcium: 8.9 mg/dL (ref 8.9–10.3)
Calcium: 8.6 mg/dL — ABNORMAL LOW (ref 8.9–10.3)
Calcium: 8.7 mg/dL — ABNORMAL LOW (ref 8.9–10.3)
Chloride: 104 mmol/L (ref 98–111)
Chloride: 104 mmol/L (ref 98–111)
Chloride: 103 mmol/L (ref 98–111)
Creatinine, Ser: 0.94 mg/dL (ref 0.61–1.24)
Creatinine, Ser: 0.9 mg/dL (ref 0.61–1.24)
Creatinine, Ser: 0.91 mg/dL (ref 0.61–1.24)
GFR, Estimated: >60 mL/min (ref >60)
GFR, Estimated: >60 mL/min (ref >60)
GFR, Estimated: >60 mL/min (ref >60)
Glucose, Bld: 92 mg/dL (ref 70–99)
Glucose, Bld: 94 mg/dL (ref 70–99)
Glucose, Bld: 89 mg/dL (ref 70–99)
Potassium: 3.4 mmol/L — ABNORMAL LOW (ref 3.5–5.1)
Potassium: 3.4 mmol/L — ABNORMAL LOW (ref 3.5–5.1)
Potassium: 3.8 mmol/L (ref 3.5–5.1)
Sodium: 138 mmol/L (ref 135–145)
Sodium: 137 mmol/L (ref 135–145)
Sodium: 136 mmol/L (ref 135–145)

## 2020-12-19 LAB — LIPID PANEL
Cholesterol: 161 mg/dL (ref 0–200)
HDL: 47 mg/dL (ref >40)
LDL Cholesterol: 92 mg/dL (ref 0–99)
Total CHOL/HDL Ratio: 3.4 RATIO
Triglycerides: 110 mg/dL (ref <150)
VLDL: 22 mg/dL (ref 0–40)

## 2020-12-19 LAB — HEPARIN LEVEL (UNFRACTIONATED)
Heparin Unfractionated: 0.28 IU/mL — ABNORMAL LOW (ref 0.30–0.70)
Heparin Unfractionated: 0.42 IU/mL (ref 0.30–0.70)

## 2020-12-19 LAB — TROPONIN I (HIGH SENSITIVITY): Troponin I (High Sensitivity): 9116 ng/L (ref <18)

## 2020-12-19 LAB — HEMOGLOBIN A1C
Hgb A1c MFr Bld: 5.5 % (ref 4.8–5.6)
Mean Plasma Glucose: 111.15 mg/dL

## 2020-12-19 SURGERY — LEFT HEART CATH AND CORONARY ANGIOGRAPHY
Anesthesia: LOCAL

## 2020-12-19 MED ORDER — SODIUM CHLORIDE 0.9% FLUSH: 3.0000 mL | INTRAVENOUS | Status: DC | PRN

## 2020-12-19 MED ORDER — ASPIRIN 81 MG PO CHEW
81.0000 mg | CHEWABLE_TABLET | Freq: Every day | ORAL | Status: DC
  Filled 2020-12-19: qty 1

## 2020-12-19 MED ORDER — ACETAMINOPHEN 325 MG PO TABS: 650.0000 mg | ORAL_TABLET | ORAL | Status: DC | PRN

## 2020-12-19 MED ORDER — SODIUM CHLORIDE 0.9 % IV SOLN: 250.0000 mL | INTRAVENOUS | Status: DC | PRN

## 2020-12-19 MED ORDER — IOHEXOL 350 MG/ML SOLN
INTRAVENOUS | Status: DC | PRN
  Administered 2020-12-19: 75 mL via INTRA_ARTERIAL

## 2020-12-19 MED ORDER — SODIUM CHLORIDE 0.9% FLUSH
3.0000 mL | Freq: Two times a day (BID) | INTRAVENOUS | Status: DC
  Administered 2020-12-19: 3 mL via INTRAVENOUS

## 2020-12-19 MED ORDER — PNEUMOCOCCAL VAC POLYVALENT 25 MCG/0.5ML IJ INJ
0.5000 mL | INJECTION | INTRAMUSCULAR | Status: DC
  Filled 2020-12-19: qty 0.5

## 2020-12-19 MED ORDER — SODIUM CHLORIDE 0.9 % IV SOLN: INTRAVENOUS | Status: AC

## 2020-12-19 MED ORDER — HEPARIN (PORCINE) 25000 UT/250ML-% IV SOLN
1400.0000 [IU]/h | INTRAVENOUS | Status: DC
  Filled 2020-12-19: qty 250

## 2020-12-19 MED ORDER — LIDOCAINE HCL (PF) 1 % IJ SOLN
INTRAMUSCULAR | Status: DC | PRN
  Administered 2020-12-19: 2 mL

## 2020-12-19 MED ORDER — VERAPAMIL HCL 2.5 MG/ML IV SOLN
INTRAVENOUS | Status: AC
  Filled 2020-12-19: qty 2

## 2020-12-19 MED ORDER — VERAPAMIL HCL 2.5 MG/ML IV SOLN
INTRAVENOUS | Status: DC | PRN
  Administered 2020-12-19: 10 mL via INTRA_ARTERIAL

## 2020-12-19 MED ORDER — LIDOCAINE HCL (PF) 1 % IJ SOLN
INTRAMUSCULAR | Status: AC
  Filled 2020-12-19: qty 30

## 2020-12-19 MED ORDER — ONDANSETRON HCL 4 MG/2ML IJ SOLN: 4.0000 mg | Freq: Four times a day (QID) | INTRAMUSCULAR | Status: DC | PRN

## 2020-12-19 MED ORDER — ATORVASTATIN CALCIUM 80 MG PO TABS
80.0000 mg | ORAL_TABLET | Freq: Every day | ORAL | Status: DC
  Administered 2020-12-19 – 2020-12-20 (×2): 80 mg via ORAL
  Filled 2020-12-19 (×2): qty 1

## 2020-12-19 MED ORDER — SODIUM CHLORIDE 0.9 % WEIGHT BASED INFUSION
1.0000 mL/kg/h | INTRAVENOUS | Status: DC
  Administered 2020-12-20: 1 mL/kg/h via INTRAVENOUS

## 2020-12-19 MED ORDER — OXYCODONE HCL 5 MG PO TABS: 5.0000 mg | ORAL_TABLET | ORAL | Status: DC | PRN

## 2020-12-19 MED ORDER — FENTANYL CITRATE (PF) 100 MCG/2ML IJ SOLN
INTRAMUSCULAR | Status: DC | PRN
  Administered 2020-12-19: 50 ug via INTRAVENOUS

## 2020-12-19 MED ORDER — HEPARIN SODIUM (PORCINE) 1000 UNIT/ML IJ SOLN
INTRAMUSCULAR | Status: DC | PRN
  Administered 2020-12-19: 6000 [IU] via INTRAVENOUS

## 2020-12-19 MED ORDER — FENTANYL CITRATE (PF) 100 MCG/2ML IJ SOLN
INTRAMUSCULAR | Status: AC
  Filled 2020-12-19: qty 2

## 2020-12-19 MED ORDER — SODIUM CHLORIDE 0.9 % WEIGHT BASED INFUSION
3.0000 mL/kg/h | INTRAVENOUS | Status: DC
  Administered 2020-12-20: 3 mL/kg/h via INTRAVENOUS

## 2020-12-19 MED ORDER — MIDAZOLAM HCL 2 MG/2ML IJ SOLN
INTRAMUSCULAR | Status: DC | PRN
  Administered 2020-12-19: 1 mg via INTRAVENOUS

## 2020-12-19 MED ORDER — HYDRALAZINE HCL 20 MG/ML IJ SOLN: 10.0000 mg | INTRAMUSCULAR | Status: AC | PRN

## 2020-12-19 MED ORDER — HEPARIN SODIUM (PORCINE) 1000 UNIT/ML IJ SOLN
INTRAMUSCULAR | Status: AC
  Filled 2020-12-19: qty 1

## 2020-12-19 MED ORDER — ASPIRIN 81 MG PO CHEW
81.0000 mg | CHEWABLE_TABLET | ORAL | Status: AC
  Administered 2020-12-20: 81 mg via ORAL

## 2020-12-19 MED ORDER — HEPARIN (PORCINE) IN NACL 1000-0.9 UT/500ML-% IV SOLN
INTRAVENOUS | Status: DC | PRN
  Administered 2020-12-19 (×2): 500 mL

## 2020-12-19 MED ORDER — TICAGRELOR 90 MG PO TABS
180.0000 mg | ORAL_TABLET | Freq: Once | ORAL | Status: AC
  Administered 2020-12-19: 180 mg via ORAL
  Filled 2020-12-19: qty 2

## 2020-12-19 MED ORDER — TICAGRELOR 90 MG PO TABS
90.0000 mg | ORAL_TABLET | Freq: Two times a day (BID) | ORAL | Status: AC
  Administered 2020-12-20: 90 mg via ORAL
  Filled 2020-12-19: qty 1

## 2020-12-19 MED ORDER — HEPARIN (PORCINE) IN NACL 1000-0.9 UT/500ML-% IV SOLN
INTRAVENOUS | Status: AC
  Filled 2020-12-19: qty 1000

## 2020-12-19 MED ORDER — SODIUM CHLORIDE 0.9% FLUSH
3.0000 mL | Freq: Two times a day (BID) | INTRAVENOUS | Status: DC
  Administered 2020-12-20: 3 mL via INTRAVENOUS

## 2020-12-19 MED ORDER — ATORVASTATIN CALCIUM 80 MG PO TABS: 80.0000 mg | ORAL_TABLET | Freq: Every day | ORAL | Status: DC

## 2020-12-19 MED ORDER — MIDAZOLAM HCL 2 MG/2ML IJ SOLN
INTRAMUSCULAR | Status: AC
  Filled 2020-12-19: qty 2

## 2020-12-19 MED ORDER — LABETALOL HCL 5 MG/ML IV SOLN: 10.0000 mg | INTRAVENOUS | Status: AC | PRN

## 2020-12-19 SURGICAL SUPPLY — 10 items
CATH 5FR JL3.5 JR4 ANG PIG MP (CATHETERS) ×1 IMPLANT
CATH OPTITORQUE TIG 4.0 5F (CATHETERS) ×1 IMPLANT
DEVICE RAD COMP TR BAND LRG (VASCULAR PRODUCTS) ×1 IMPLANT
GLIDESHEATH SLEND A-KIT 6F 22G (SHEATH) ×1 IMPLANT
KIT HEART LEFT (KITS) ×2 IMPLANT
PACK CARDIAC CATHETERIZATION (CUSTOM PROCEDURE TRAY) ×2 IMPLANT
SHEATH PROBE COVER 6X72 (BAG) ×1 IMPLANT
TRANSDUCER W/STOPCOCK (MISCELLANEOUS) ×2 IMPLANT
TUBING CIL FLEX 10 FLL-RA (TUBING) ×2 IMPLANT
WIRE HI TORQ VERSACORE J 260CM (WIRE) ×1 IMPLANT

## 2020-12-19 NOTE — Progress Notes (Signed)
ANTICOAGULATION CONSULT NOTE - Follow Up Consult  Pharmacy Consult for Heparin Indication: chest pain/ACS  Allergies  Allergen Reactions   Penicillins     Mouth swells and gets short of breath    Patient Measurements: Height: 5\' 11"  (180.3 cm) Weight: 119.9 kg (264 lb 4.8 oz) IBW/kg (Calculated) : 75.3 Heparin Dosing Weight:  101.9 kg  Vital Signs: Temp: 98.1 F (36.7 C) (07/12 0405) Temp Source: Oral (07/12 0405) BP: 159/92 (07/12 1450) Pulse Rate: 59 (07/12 1450)  Labs: Recent Labs    12/18/20 0950 12/18/20 1345 12/18/20 1524 12/18/20 2237 12/19/20 0017 12/19/20 0611  HGB 16.9  --   --   --  16.4  --   HCT 49.0  --   --   --  48.4  --   PLT 142*  --   --   --  145*  --   HEPARINUNFRC  --   --   --   --  0.28* 0.42  CREATININE 0.90  --   --   --  0.94 0.90  TROPONINIHS 31*   < > 601* 7,987* 9,116*  --    < > = values in this interval not displayed.    Estimated Creatinine Clearance: 116.4 mL/min (by C-G formula based on SCr of 0.9 mg/dL).  Assessment: Anticoag: heparin for acs. Hep leel 0.42 in goal this AM. Hgb 16.4. Plts 145. - 7/12 Cath: Total occlusion of distal circumflex. Left main widely patent. Proximal LAD eccentric 80%. RCA is dominant with 50%. LV normal. Needs complex PCI on the LAD bifurcation stenosis with provisional stenting of the diagonal.  Plan orbital atherectomy followed by LAD stenting - Sheath removed>>TR band 1454.  Goal of Therapy:  Heparin level 0.3-0.7 units/ml Monitor platelets by anticoagulation protocol: Yes   Plan:  8hrs after sheath out, resume IV heparin (no bolus) at 1400 units/hr at 2300 Daily HL and CBC  Increase Lipitor to 80mg  with CAD on cath?   Nicholas Andersen S. 11-08-2001, PharmD, BCPS Clinical Staff Pharmacist [email or web address] [place] 12/19/2020,3:14 PM

## 2020-12-19 NOTE — Progress Notes (Signed)
ANTICOAGULATION CONSULT NOTE  Pharmacy Consult for heparin Indication: chest pain/ACS/STEMI  Labs: Recent Labs    12/18/20 0950 12/18/20 1345 12/18/20 1524 12/18/20 2237 12/19/20 0017 12/19/20 0611  HGB 16.9  --   --   --  16.4  --   HCT 49.0  --   --   --  48.4  --   PLT 142*  --   --   --  145*  --   HEPARINUNFRC  --   --   --   --  0.28* 0.42  CREATININE 0.90  --   --   --  0.94 0.90  TROPONINIHS 31*   < > 601* 7,987* 9,116*  --    < > = values in this interval not displayed.   Assessment: Mr. Basista 60 yo male history of hyperlipidemia, HTN, former tobacco, family history of MI in several paternal uncles presents with chest pain. He has chest tightness with feeling very clammy, numbness radiating down his right arm, some associated SOB. Patient not on any oral anticoagulants. Pharmacy asked to start heparin.  Heparin level 0.4 on 1400 units/hr. Hemoglobin stable 16, plt low stable in 140s. No bleeding or IV issues noted.    Goal of Therapy:  Heparin level 0.3-0.7 units/ml Monitor platelets by anticoagulation protocol: Yes   Plan:  Continue heparin at 1400 units/hr Check heparin level in 6 hours  Thanks for allowing pharmacy to be a part of this patient's care.  Sheppard Coil PharmD., BCPS Clinical Pharmacist 12/19/2020 8:53 AM

## 2020-12-19 NOTE — Plan of Care (Signed)
  Problem: Education: Goal: Knowledge of General Education information will improve Description Including pain rating scale, medication(s)/side effects and non-pharmacologic comfort measures Outcome: Progressing   

## 2020-12-19 NOTE — Progress Notes (Signed)
Removed TR band from Right radial. Site is a level 0. Applied gauze and Tegaderm to site. Educated pt to leave in place for 24 hrs. Pt verbalized understanding.

## 2020-12-19 NOTE — H&P (View-Only) (Signed)
Progress Note  Patient Name: Nicholas Andersen Date of Encounter: 12/19/2020  [facility] Cardiologist: None   Subjective   No chest pain this morning. Planned for cardiac cath.   Inpatient Medications    Scheduled Meds:  allopurinol  300 mg Oral Daily   amLODipine  10 mg Oral Daily   And   benazepril  20 mg Oral Daily   aspirin EC  81 mg Oral Daily   atorvastatin  40 mg Oral Daily   [START ON 12/20/2020] pneumococcal 23 valent vaccine  0.5 mL Intramuscular Tomorrow-1000   potassium chloride  40 mEq Oral Daily   sodium chloride flush  3 mL Intravenous Q12H   Continuous Infusions:  sodium chloride     sodium chloride 1 mL/kg/hr (12/19/20 0519)   heparin 1,400 Units/hr (12/19/20 0116)   PRN Meds: sodium chloride, acetaminophen, nitroGLYCERIN, ondansetron (ZOFRAN) IV, sodium chloride flush   Vital Signs    Vitals:   12/18/20 1900 12/18/20 2240 12/18/20 2243 12/19/20 0405  BP: (!) 146/84  (!) 153/97 (!) 158/88  Pulse: (!) 55  (!) 57 63  Resp: 18  18   Temp:   97.9 F (36.6 C) 98.1 F (36.7 C)  TempSrc:   Oral Oral  SpO2: 96%  97% 96%  Weight:  119.9 kg    Height:  5\' 11"  (1.803 m)      Intake/Output Summary (Last 24 hours) at 12/19/2020 0935 Last data filed at 12/19/2020 02/19/2021 Gross per 24 hour  Intake 354.85 ml  Output 1500 ml  Net -1145.15 ml   Last 3 Weights 12/18/2020 12/18/2020  Weight (lbs) 264 lb 4.8 oz 270 lb  Weight (kg) 119.886 kg 122.471 kg      Telemetry    SB, PVC - Personally Reviewed  ECG    SB with prolonged PR- Personally Reviewed  Physical Exam   GEN: No acute distress.   Neck: No JVD Cardiac: RRR, no murmurs, rubs, or gallops.  Respiratory: Clear to auscultation bilaterally. GI: Soft, nontender, non-distended  MS: No edema; No deformity. Neuro:  Nonfocal  Psych: Normal affect   Labs    High Sensitivity Troponin:   Recent Labs  Lab 12/18/20 0950 12/18/20 1345 12/18/20 1524 12/18/20 2237 12/19/20 0017  TROPONINIHS 31*  234* 601* 7,987* 9,116*      Chemistry Recent Labs  Lab 12/18/20 0950 12/19/20 0017 12/19/20 0611  NA 136 138 137  K 3.3* 3.4* 3.4*  CL 101 104 104  CO2 25 26 26   GLUCOSE 126* 92 94  BUN 18 11 7   CREATININE 0.90 0.94 0.90  CALCIUM 9.5 8.9 8.6*  PROT 8.2*  --   --   ALBUMIN 4.5  --   --   AST 31  --   --   ALT 29  --   --   ALKPHOS 102  --   --   BILITOT 0.7  --   --   GFRNONAA >60 >60 >60  ANIONGAP 10 8 7      Hematology Recent Labs  Lab 12/18/20 0950 12/19/20 0017  WBC 7.6 9.0  RBC 5.68 5.66  HGB 16.9 16.4  HCT 49.0 48.4  MCV 86.3 85.5  MCH 29.8 29.0  MCHC 34.5 33.9  RDW 13.5 13.6  PLT 142* 145*    BNPNo results for input(s): BNP, PROBNP in the last 168 hours.   DDimer No results for input(s): DDIMER in the last 168 hours.   Radiology    CT Head Wo Contrast  Result Date: 12/18/2020 CLINICAL DATA:  Bilateral arm tingling. EXAM: CT HEAD WITHOUT CONTRAST TECHNIQUE: Contiguous axial images were obtained from the base of the skull through the vertex without intravenous contrast. COMPARISON:  None. FINDINGS: Brain: No evidence of acute infarction, hemorrhage, hydrocephalus, extra-axial collection or mass lesion/mass effect. Mild generalized cerebral atrophy. Vascular: Atherosclerotic vascular calcification of the carotid siphons. No hyperdense vessel. Skull: Normal. Negative for fracture or focal lesion. Sinuses/Orbits: Extensive bilateral paranasal sinus disease. The orbits are unremarkable. Other: None. IMPRESSION: 1. No acute intracranial abnormality. 2. Extensive bilateral paranasal sinus disease. Electronically Signed   By: Obie Dredge M.D.   On: 12/18/2020 12:14   DG Chest Portable 1 View  Result Date: 12/18/2020 CLINICAL DATA:  Chest pain, pain in center of chest with bilateral arm tingling. EXAM: PORTABLE CHEST 1 VIEW COMPARISON:  None FINDINGS: EKG leads project over the chest. Trachea is midline. Cardiomediastinal contours and hilar structures are  normal. Lungs are clear.  No sign of effusion or pneumothorax. On limited assessment there is no acute skeletal process. IMPRESSION: No acute cardiopulmonary disease. Electronically Signed   By: Donzetta Kohut M.D.   On: 12/18/2020 10:08   ECHOCARDIOGRAM COMPLETE  Result Date: 12/18/2020    ECHOCARDIOGRAM REPORT   Patient Name:   Nicholas Andersen Date of Exam: 12/18/2020 Medical Rec #:  [id number]      Height:       71.0 in Accession #:    [id number]     Weight:       270.0 lb Date of Birth:  1961-01-02     BSA:          2.395 m Patient Age:    59 years       BP:           177/99 mmHg Patient Gender: M              HR:           51 bpm. [facility] Procedure: 2D Echo, Cardiac Doppler and Color Doppler Indications:    Chest Pain  History:        Patient has no prior history of Echocardiogram examinations.                 Signs/Symptoms:Chest Pain.  Sonographer:    Mikki Harbor Referring Phys: [id number] Dorothe Pea BRANCH IMPRESSIONS  1. Left ventricular ejection fraction, by estimation, is 50 to 55%. The left ventricle has normal function. Left ventricular endocardial border not optimally defined to evaluate regional wall motion. Left ventricular diastolic parameters are indeterminate.  2. Right ventricular systolic function is normal. The right ventricular size is normal. Tricuspid regurgitation signal is inadequate for assessing PA pressure.  3. The mitral valve is normal in structure. No evidence of mitral valve regurgitation. No evidence of mitral stenosis.  4. The aortic valve is tricuspid. There is mild calcification of the aortic valve. There is mild thickening of the aortic valve. Aortic valve regurgitation is not visualized. No aortic stenosis is present.  5. The inferior vena cava is normal in size with greater than 50% respiratory variability, suggesting right atrial pressure of 3 mmHg. FINDINGS  Left Ventricle: Left ventricular ejection fraction, by estimation, is 50 to 55%. The left  ventricle has normal function. Left ventricular endocardial border not optimally defined to evaluate regional wall motion. The left ventricular internal cavity size was normal in size. There is no left ventricular hypertrophy. Left ventricular diastolic parameters are indeterminate. Right Ventricle:  The right ventricular size is normal. No increase in right ventricular wall thickness. Right ventricular systolic function is normal. Tricuspid regurgitation signal is inadequate for assessing PA pressure. Left Atrium: Left atrial size was normal in size. Right Atrium: Right atrial size was normal in size. Pericardium: There is no evidence of pericardial effusion. Mitral Valve: The mitral valve is normal in structure. No evidence of mitral valve regurgitation. No evidence of mitral valve stenosis. MV peak gradient, 5.1 mmHg. The mean mitral valve gradient is 1.0 mmHg. Tricuspid Valve: The tricuspid valve is normal in structure. Tricuspid valve regurgitation is not demonstrated. No evidence of tricuspid stenosis. Aortic Valve: The aortic valve is tricuspid. There is mild calcification of the aortic valve. There is mild thickening of the aortic valve. There is mild aortic valve annular calcification. Aortic valve regurgitation is not visualized. No aortic stenosis  is present. Aortic valve mean gradient measures 8.5 mmHg. Aortic valve peak gradient measures 17.7 mmHg. Aortic valve area, by VTI measures 2.15 cm. Pulmonic Valve: The pulmonic valve was not well visualized. Pulmonic valve regurgitation is not visualized. No evidence of pulmonic stenosis. Aorta: The aortic root is normal in size and structure. Venous: The inferior vena cava is normal in size with greater than 50% respiratory variability, suggesting right atrial pressure of 3 mmHg. IAS/Shunts: No atrial level shunt detected by color flow Doppler.  LEFT VENTRICLE PLAX 2D LVIDd:         4.68 cm  Diastology LVIDs:         3.40 cm  LV e' medial:    6.40 cm/s LV PW:          1.02 cm  LV E/e' medial:  15.5 LV IVS:        1.00 cm  LV e' lateral:   11.60 cm/s LVOT diam:     2.00 cm  LV E/e' lateral: 8.5 LV SV:         104 LV SV Index:   44 LVOT Area:     3.14 cm  RIGHT VENTRICLE RV Basal diam:  4.12 cm RV Mid diam:    3.51 cm RV S prime:     11.50 cm/s TAPSE (M-mode): 3.1 cm LEFT ATRIUM             Index       RIGHT ATRIUM           Index LA diam:        4.30 cm 1.80 cm/m  RA Area:     14.90 cm LA Vol (A2C):   78.9 ml 32.94 ml/m RA Volume:   36.90 ml  15.40 ml/m LA Vol (A4C):   68.8 ml 28.72 ml/m LA Biplane Vol: 79.4 ml 33.15 ml/m  AORTIC VALVE AV Area (Vmax):    2.18 cm AV Area (Vmean):   2.10 cm AV Area (VTI):     2.15 cm AV Vmax:           210.50 cm/s AV Vmean:          134.500 cm/s AV VTI:            0.486 m AV Peak Grad:      17.7 mmHg AV Mean Grad:      8.5 mmHg LVOT Vmax:         146.00 cm/s LVOT Vmean:        90.000 cm/s LVOT VTI:          0.332 m LVOT/AV VTI ratio: 0.68  AORTA Ao  Root diam: 3.10 cm Ao Asc diam:  3.60 cm MITRAL VALVE MV Area (PHT): 3.33 cm    SHUNTS MV Area VTI:   2.37 cm    Systemic VTI:  0.33 m MV Peak grad:  5.1 mmHg    Systemic Diam: 2.00 cm MV Mean grad:  1.0 mmHg MV Vmax:       1.13 m/s MV Vmean:      52.5 cm/s MV Decel Time: 228 msec MV E velocity: 99.00 cm/s MV A velocity: 91.30 cm/s MV E/A ratio:  1.08 Dina Rich MD Electronically signed by Dina Rich MD Signature Date/Time: 12/18/2020/5:12:30 PM    Final    CT Angio Chest/Abd/Pel for Dissection W and/or Wo Contrast  Result Date: 12/18/2020 CLINICAL DATA:  Acute onset central chest and right shoulder blade pain. EXAM: CT ANGIOGRAPHY CHEST, ABDOMEN AND PELVIS TECHNIQUE: Non-contrast CT of the chest was initially obtained. Multidetector CT imaging through the chest, abdomen and pelvis was performed using the standard protocol during bolus administration of intravenous contrast. Multiplanar reconstructed images and MIPs were obtained and reviewed to evaluate the vascular  anatomy. CONTRAST:  OMNIPAQUE IOHEXOL 350 MG/ML SOLN COMPARISON:  Chest x-ray from same day. FINDINGS: CTA CHEST FINDINGS Cardiovascular: Preferential opacification of the thoracic aorta. No evidence of thoracic aortic aneurysm or dissection. Normal heart size. Three-vessel coronary artery calcification. No pericardial effusion. No central pulmonary embolism. Mediastinum/Nodes: No enlarged mediastinal, hilar, or axillary lymph nodes. Thyroid gland, trachea, and esophagus demonstrate no significant findings. Lungs/Pleura: Lungs are clear. No pleural effusion or pneumothorax. 4 mm subpleural nodule in the superior segment of the left lower lobe (series 9, image 74). Musculoskeletal: No chest wall abnormality. No acute or significant osseous findings. Review of the MIP images confirms the above findings. CTA ABDOMEN AND PELVIS FINDINGS VASCULAR Aorta: Normal caliber aorta without aneurysm, dissection, vasculitis or significant stenosis. Mild atherosclerotic calcification. Celiac: Patent. 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 SMA: Patent without evidence of aneurysm, dissection, vasculitis or significant stenosis. Renals: Both renal arteries are patent without evidence of aneurysm, dissection, vasculitis, fibromuscular dysplasia or significant stenosis. IMA: Patent without evidence of aneurysm, dissection, vasculitis or significant stenosis. Inflow: Patent without evidence of aneurysm, dissection, vasculitis or significant stenosis. Veins: No obvious venous abnormality within the limitations of this arterial phase study. Review of the MIP images confirms the above findings. NON-VASCULAR Hepatobiliary: Diffusely decreased liver density without focal abnormality. The gallbladder is unremarkable. No biliary dilatation. Pancreas: Unremarkable. No pancreatic ductal dilatation or surrounding inflammatory changes. Spleen: Normal in size without focal  abnormality. Adrenals/Urinary Tract: The adrenal glands are unremarkable. 1.2 cm simple cyst in the central midpole of the right kidney. No renal calculi or hydronephrosis. Mild circumferential bladder wall thickening is likely related to under distension. Stomach/Bowel: Stomach is within normal limits. Appendix appears normal. No evidence of bowel wall thickening, distention, or inflammatory changes. Few scattered colonic diverticula. Lymphatic: No enlarged abdominal or pelvic lymph nodes. Reproductive: Prostate is unremarkable. Other: No abdominal wall hernia or abnormality. No abdominopelvic ascites. No pneumoperitoneum. Musculoskeletal: No acute or significant osseous findings. Review of the MIP images confirms the above findings. IMPRESSION: Vascular: 1. No evidence of aortic dissection or aneurysm. 2. 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 3. Aortic Atherosclerosis (ICD10-I70.0). Chest: 1.  No acute intrathoracic process. 2. 4 mm subpleural nodule in the left lower lobe. No follow-up needed if patient  is low-risk. Non-contrast chest CT can be considered in 12 months if patient is high-risk. This recommendation follows the consensus statement: Guidelines for Management of Incidental Pulmonary Nodules Detected on CT Images: From the Fleischner Society 2017; Radiology 2017; 284:228-243. Abdomen pelvis: 1.  No acute intra-abdominal process. 2. Hepatic steatosis. Electronically Signed   By: Obie DredgeWilliam T Derry M.D.   On: 12/18/2020 12:27    Cardiac Studies   Echo: 12/18/20  IMPRESSIONS     1. Left ventricular ejection fraction, by estimation, is 50 to 55%. The  left ventricle has normal function. Left ventricular endocardial border  not optimally defined to evaluate regional wall motion. Left ventricular  diastolic parameters are  indeterminate.   2. Right ventricular systolic function is normal. The right ventricular  size  is normal. Tricuspid regurgitation signal is inadequate for assessing  PA pressure.   3. The mitral valve is normal in structure. No evidence of mitral valve  regurgitation. No evidence of mitral stenosis.   4. The aortic valve is tricuspid. There is mild calcification of the  aortic valve. There is mild thickening of the aortic valve. Aortic valve  regurgitation is not visualized. No aortic stenosis is present.   5. The inferior vena cava is normal in size with greater than 50%  respiratory variability, suggesting right atrial pressure of 3 mmHg.   FINDINGS   Left Ventricle: Left ventricular ejection fraction, by estimation, is 50  to 55%. The left ventricle has normal function. Left ventricular  endocardial border not optimally defined to evaluate regional wall motion.  The left ventricular internal cavity  size was normal in size. There is no left ventricular hypertrophy. Left  ventricular diastolic parameters are indeterminate.   Right Ventricle: The right ventricular size is normal. No increase in  right ventricular wall thickness. Right ventricular systolic function is  normal. Tricuspid regurgitation signal is inadequate for assessing PA  pressure.   Left Atrium: Left atrial size was normal in size.   Right Atrium: Right atrial size was normal in size.   Pericardium: There is no evidence of pericardial effusion.   Mitral Valve: The mitral valve is normal in structure. No evidence of  mitral valve regurgitation. No evidence of mitral valve stenosis. MV peak  gradient, 5.1 mmHg. The mean mitral valve gradient is 1.0 mmHg.   Tricuspid Valve: The tricuspid valve is normal in structure. Tricuspid  valve regurgitation is not demonstrated. No evidence of tricuspid  stenosis.   Aortic Valve: The aortic valve is tricuspid. There is mild calcification  of the aortic valve. There is mild thickening of the aortic valve. There  is mild aortic valve annular calcification. Aortic valve  regurgitation is  not visualized. No aortic stenosis   is present. Aortic valve mean gradient measures 8.5 mmHg. Aortic valve  peak gradient measures 17.7 mmHg. Aortic valve area, by VTI measures 2.15  cm.   Pulmonic Valve: The pulmonic valve was not well visualized. Pulmonic valve  regurgitation is not visualized. No evidence of pulmonic stenosis.   Aorta: The aortic root is normal in size and structure.   Venous: The inferior vena cava is normal in size with greater than 50%  respiratory variability, suggesting right atrial pressure of 3 mmHg.   IAS/Shunts: No atrial level shunt detected by color flow Doppler.   Patient Profile     60 y.o. male with a hx of HTN, hyperlipidemia, prior tobacco history who is being seen 12/18/2020 for the evaluation of chest pain at the request  of Dr Jacqulyn Bath.   Assessment & Plan    NSTEMI: hsTn peaked at 9116. Placed on IV heparin and transferred from [facility] to [facility] for further work up with cardiac cath. Echo noted EF of 50-55% unable to determine WMA.  -- remains on IV heparin, ASA, statin -- planned for cardiac cath today  HLD: LDL 92 -- on atorvastatin 40mg , will further increase to 80mg   PVCs: supplemented K+ -- bradycardic, unable to add BB therapy at present  Hypokalemia: K+ 3.4, suppl  OSA: reports using Cpap in the past but has not been complaint over the past several years.  -- will need outpatient sleep study  HTN: on combo amlodipine/benazepril. Reports blood pressures have not been well controlled at home. Will plan for further adjustment post cath -- no room for BB with bradycardia    For questions or updates, please contact [facility] Please consult [email or web address] for contact info under        Signed, , NP  12/19/2020, 9:35 AM

## 2020-12-19 NOTE — Interval H&P Note (Signed)
Cath Lab Visit (complete for each Cath Lab visit)  Clinical Evaluation Leading to the Procedure:   ACS: Yes.    Non-ACS:    Anginal Classification: CCS III  Anti-ischemic medical therapy: Minimal Therapy (1 class of medications)  Non-Invasive Test Results: No non-invasive testing performed  Prior CABG: No previous CABG      History and Physical Interval Note:  12/19/2020 1:34 PM  Nicholas Andersen  has presented today for surgery, with the diagnosis of nstemi.  The various methods of treatment have been discussed with the patient and family. After consideration of risks, benefits and other options for treatment, the patient has consented to  Procedure(s): LEFT HEART CATH AND CORONARY ANGIOGRAPHY (N/A) as a surgical intervention.  The patient's history has been reviewed, patient examined, no change in status, stable for surgery.  I have reviewed the patient's chart and labs.  Questions were answered to the patient's satisfaction.     Lyn Records III

## 2020-12-19 NOTE — CV Procedure (Signed)
Total occlusion of distal circumflex.  Minimal collaterals noted. Left main widely patent Proximal LAD eccentric 80% hazy/calcified [place] 110 stenosis with a large branching diagonal.  Possibly a culprit. RCA is dominant with 50% eccentric mid stenosis.  PDA is large. LV has been demonstrated normal by echo.  LVEDP is normal.  After discussing with colleagues, we will plan complex PCI on the LAD bifurcation stenosis with provisional stenting of the diagonal.  Plan orbital atherectomy followed by LAD stenting after intravascular ultrasound to properly sized the distal and proximal vessel segments.  Loaded with Brilinta this evening.  Plan femoral approach as right radial approach is complicated by excessive innominate/subclavian tortuosity.  Discussed with patient, Dr. Herbie Baltimore, and Dr. [place].  PCI approach seems reasonable.

## 2020-12-19 NOTE — Progress Notes (Signed)
Progress Note  Patient Name: Nicholas Andersen Date of Encounter: 12/19/2020  [facility] Cardiologist: None   Subjective   No chest pain this morning. Planned for cardiac cath.   Inpatient Medications    Scheduled Meds:  allopurinol  300 mg Oral Daily   amLODipine  10 mg Oral Daily   And   benazepril  20 mg Oral Daily   aspirin EC  81 mg Oral Daily   atorvastatin  40 mg Oral Daily   [START ON 12/20/2020] pneumococcal 23 valent vaccine  0.5 mL Intramuscular Tomorrow-1000   potassium chloride  40 mEq Oral Daily   sodium chloride flush  3 mL Intravenous Q12H   Continuous Infusions:  sodium chloride     sodium chloride 1 mL/kg/hr (12/19/20 0519)   heparin 1,400 Units/hr (12/19/20 0116)   PRN Meds: sodium chloride, acetaminophen, nitroGLYCERIN, ondansetron (ZOFRAN) IV, sodium chloride flush   Vital Signs    Vitals:   12/18/20 1900 12/18/20 2240 12/18/20 2243 12/19/20 0405  BP: (!) 146/84  (!) 153/97 (!) 158/88  Pulse: (!) 55  (!) 57 63  Resp: 18  18   Temp:   97.9 F (36.6 C) 98.1 F (36.7 C)  TempSrc:   Oral Oral  SpO2: 96%  97% 96%  Weight:  119.9 kg    Height:  5\' 11"  (1.803 m)      Intake/Output Summary (Last 24 hours) at 12/19/2020 0935 Last data filed at 12/19/2020 02/19/2021 Gross per 24 hour  Intake 354.85 ml  Output 1500 ml  Net -1145.15 ml   Last 3 Weights 12/18/2020 12/18/2020  Weight (lbs) 264 lb 4.8 oz 270 lb  Weight (kg) 119.886 kg 122.471 kg      Telemetry    SB, PVC - Personally Reviewed  ECG    SB with prolonged PR- Personally Reviewed  Physical Exam   GEN: No acute distress.   Neck: No JVD Cardiac: RRR, no murmurs, rubs, or gallops.  Respiratory: Clear to auscultation bilaterally. GI: Soft, nontender, non-distended  MS: No edema; No deformity. Neuro:  Nonfocal  Psych: Normal affect   Labs    High Sensitivity Troponin:   Recent Labs  Lab 12/18/20 0950 12/18/20 1345 12/18/20 1524 12/18/20 2237 12/19/20 0017  TROPONINIHS 31*  234* 601* 7,987* 9,116*      Chemistry Recent Labs  Lab 12/18/20 0950 12/19/20 0017 12/19/20 0611  NA 136 138 137  K 3.3* 3.4* 3.4*  CL 101 104 104  CO2 25 26 26   GLUCOSE 126* 92 94  BUN 18 11 7   CREATININE 0.90 0.94 0.90  CALCIUM 9.5 8.9 8.6*  PROT 8.2*  --   --   ALBUMIN 4.5  --   --   AST 31  --   --   ALT 29  --   --   ALKPHOS 102  --   --   BILITOT 0.7  --   --   GFRNONAA >60 >60 >60  ANIONGAP 10 8 7      Hematology Recent Labs  Lab 12/18/20 0950 12/19/20 0017  WBC 7.6 9.0  RBC 5.68 5.66  HGB 16.9 16.4  HCT 49.0 48.4  MCV 86.3 85.5  MCH 29.8 29.0  MCHC 34.5 33.9  RDW 13.5 13.6  PLT 142* 145*    BNPNo results for input(s): BNP, PROBNP in the last 168 hours.   DDimer No results for input(s): DDIMER in the last 168 hours.   Radiology    CT Head Wo Contrast  Result Date: 12/18/2020 CLINICAL DATA:  Bilateral arm tingling. EXAM: CT HEAD WITHOUT CONTRAST TECHNIQUE: Contiguous axial images were obtained from the base of the skull through the vertex without intravenous contrast. COMPARISON:  None. FINDINGS: Brain: No evidence of acute infarction, hemorrhage, hydrocephalus, extra-axial collection or mass lesion/mass effect. Mild generalized cerebral atrophy. Vascular: Atherosclerotic vascular calcification of the carotid siphons. No hyperdense vessel. Skull: Normal. Negative for fracture or focal lesion. Sinuses/Orbits: Extensive bilateral paranasal sinus disease. The orbits are unremarkable. Other: None. IMPRESSION: 1. No acute intracranial abnormality. 2. Extensive bilateral paranasal sinus disease. Electronically Signed   By: Obie Dredge M.D.   On: 12/18/2020 12:14   DG Chest Portable 1 View  Result Date: 12/18/2020 CLINICAL DATA:  Chest pain, pain in center of chest with bilateral arm tingling. EXAM: PORTABLE CHEST 1 VIEW COMPARISON:  None FINDINGS: EKG leads project over the chest. Trachea is midline. Cardiomediastinal contours and hilar structures are  normal. Lungs are clear.  No sign of effusion or pneumothorax. On limited assessment there is no acute skeletal process. IMPRESSION: No acute cardiopulmonary disease. Electronically Signed   By: Donzetta Kohut M.D.   On: 12/18/2020 10:08   ECHOCARDIOGRAM COMPLETE  Result Date: 12/18/2020    ECHOCARDIOGRAM REPORT   Patient Name:   Nicholas Andersen Date of Exam: 12/18/2020 Medical Rec #:  [id number]      Height:       71.0 in Accession #:    [id number]     Weight:       270.0 lb Date of Birth:  1961-01-02     BSA:          2.395 m Patient Age:    59 years       BP:           177/99 mmHg Patient Gender: M              HR:           51 bpm. [facility] Procedure: 2D Echo, Cardiac Doppler and Color Doppler Indications:    Chest Pain  History:        Patient has no prior history of Echocardiogram examinations.                 Signs/Symptoms:Chest Pain.  Sonographer:    Mikki Harbor Referring Phys: [id number] Dorothe Pea BRANCH IMPRESSIONS  1. Left ventricular ejection fraction, by estimation, is 50 to 55%. The left ventricle has normal function. Left ventricular endocardial border not optimally defined to evaluate regional wall motion. Left ventricular diastolic parameters are indeterminate.  2. Right ventricular systolic function is normal. The right ventricular size is normal. Tricuspid regurgitation signal is inadequate for assessing PA pressure.  3. The mitral valve is normal in structure. No evidence of mitral valve regurgitation. No evidence of mitral stenosis.  4. The aortic valve is tricuspid. There is mild calcification of the aortic valve. There is mild thickening of the aortic valve. Aortic valve regurgitation is not visualized. No aortic stenosis is present.  5. The inferior vena cava is normal in size with greater than 50% respiratory variability, suggesting right atrial pressure of 3 mmHg. FINDINGS  Left Ventricle: Left ventricular ejection fraction, by estimation, is 50 to 55%. The left  ventricle has normal function. Left ventricular endocardial border not optimally defined to evaluate regional wall motion. The left ventricular internal cavity size was normal in size. There is no left ventricular hypertrophy. Left ventricular diastolic parameters are indeterminate. Right Ventricle:  The right ventricular size is normal. No increase in right ventricular wall thickness. Right ventricular systolic function is normal. Tricuspid regurgitation signal is inadequate for assessing PA pressure. Left Atrium: Left atrial size was normal in size. Right Atrium: Right atrial size was normal in size. Pericardium: There is no evidence of pericardial effusion. Mitral Valve: The mitral valve is normal in structure. No evidence of mitral valve regurgitation. No evidence of mitral valve stenosis. MV peak gradient, 5.1 mmHg. The mean mitral valve gradient is 1.0 mmHg. Tricuspid Valve: The tricuspid valve is normal in structure. Tricuspid valve regurgitation is not demonstrated. No evidence of tricuspid stenosis. Aortic Valve: The aortic valve is tricuspid. There is mild calcification of the aortic valve. There is mild thickening of the aortic valve. There is mild aortic valve annular calcification. Aortic valve regurgitation is not visualized. No aortic stenosis  is present. Aortic valve mean gradient measures 8.5 mmHg. Aortic valve peak gradient measures 17.7 mmHg. Aortic valve area, by VTI measures 2.15 cm. Pulmonic Valve: The pulmonic valve was not well visualized. Pulmonic valve regurgitation is not visualized. No evidence of pulmonic stenosis. Aorta: The aortic root is normal in size and structure. Venous: The inferior vena cava is normal in size with greater than 50% respiratory variability, suggesting right atrial pressure of 3 mmHg. IAS/Shunts: No atrial level shunt detected by color flow Doppler.  LEFT VENTRICLE PLAX 2D LVIDd:         4.68 cm  Diastology LVIDs:         3.40 cm  LV e' medial:    6.40 cm/s LV PW:          1.02 cm  LV E/e' medial:  15.5 LV IVS:        1.00 cm  LV e' lateral:   11.60 cm/s LVOT diam:     2.00 cm  LV E/e' lateral: 8.5 LV SV:         104 LV SV Index:   44 LVOT Area:     3.14 cm  RIGHT VENTRICLE RV Basal diam:  4.12 cm RV Mid diam:    3.51 cm RV S prime:     11.50 cm/s TAPSE (M-mode): 3.1 cm LEFT ATRIUM             Index       RIGHT ATRIUM           Index LA diam:        4.30 cm 1.80 cm/m  RA Area:     14.90 cm LA Vol (A2C):   78.9 ml 32.94 ml/m RA Volume:   36.90 ml  15.40 ml/m LA Vol (A4C):   68.8 ml 28.72 ml/m LA Biplane Vol: 79.4 ml 33.15 ml/m  AORTIC VALVE AV Area (Vmax):    2.18 cm AV Area (Vmean):   2.10 cm AV Area (VTI):     2.15 cm AV Vmax:           210.50 cm/s AV Vmean:          134.500 cm/s AV VTI:            0.486 m AV Peak Grad:      17.7 mmHg AV Mean Grad:      8.5 mmHg LVOT Vmax:         146.00 cm/s LVOT Vmean:        90.000 cm/s LVOT VTI:          0.332 m LVOT/AV VTI ratio: 0.68  AORTA Ao   Root diam: 3.10 cm Ao Asc diam:  3.60 cm MITRAL VALVE MV Area (PHT): 3.33 cm    SHUNTS MV Area VTI:   2.37 cm    Systemic VTI:  0.33 m MV Peak grad:  5.1 mmHg    Systemic Diam: 2.00 cm MV Mean grad:  1.0 mmHg MV Vmax:       1.13 m/s MV Vmean:      52.5 cm/s MV Decel Time: 228 msec MV E velocity: 99.00 cm/s MV A velocity: 91.30 cm/s MV E/A ratio:  1.08 Dina Rich MD Electronically signed by Dina Rich MD Signature Date/Time: 12/18/2020/5:12:30 PM    Final    CT Angio Chest/Abd/Pel for Dissection W and/or Wo Contrast  Result Date: 12/18/2020 CLINICAL DATA:  Acute onset central chest and right shoulder blade pain. EXAM: CT ANGIOGRAPHY CHEST, ABDOMEN AND PELVIS TECHNIQUE: Non-contrast CT of the chest was initially obtained. Multidetector CT imaging through the chest, abdomen and pelvis was performed using the standard protocol during bolus administration of intravenous contrast. Multiplanar reconstructed images and MIPs were obtained and reviewed to evaluate the vascular  anatomy. CONTRAST:  OMNIPAQUE IOHEXOL 350 MG/ML SOLN COMPARISON:  Chest x-ray from same day. FINDINGS: CTA CHEST FINDINGS Cardiovascular: Preferential opacification of the thoracic aorta. No evidence of thoracic aortic aneurysm or dissection. Normal heart size. Three-vessel coronary artery calcification. No pericardial effusion. No central pulmonary embolism. Mediastinum/Nodes: No enlarged mediastinal, hilar, or axillary lymph nodes. Thyroid gland, trachea, and esophagus demonstrate no significant findings. Lungs/Pleura: Lungs are clear. No pleural effusion or pneumothorax. 4 mm subpleural nodule in the superior segment of the left lower lobe (series 9, image 74). Musculoskeletal: No chest wall abnormality. No acute or significant osseous findings. Review of the MIP images confirms the above findings. CTA ABDOMEN AND PELVIS FINDINGS VASCULAR Aorta: Normal caliber aorta without aneurysm, dissection, vasculitis or significant stenosis. Mild atherosclerotic calcification. Celiac: Patent. 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 SMA: Patent without evidence of aneurysm, dissection, vasculitis or significant stenosis. Renals: Both renal arteries are patent without evidence of aneurysm, dissection, vasculitis, fibromuscular dysplasia or significant stenosis. IMA: Patent without evidence of aneurysm, dissection, vasculitis or significant stenosis. Inflow: Patent without evidence of aneurysm, dissection, vasculitis or significant stenosis. Veins: No obvious venous abnormality within the limitations of this arterial phase study. Review of the MIP images confirms the above findings. NON-VASCULAR Hepatobiliary: Diffusely decreased liver density without focal abnormality. The gallbladder is unremarkable. No biliary dilatation. Pancreas: Unremarkable. No pancreatic ductal dilatation or surrounding inflammatory changes. Spleen: Normal in size without focal  abnormality. Adrenals/Urinary Tract: The adrenal glands are unremarkable. 1.2 cm simple cyst in the central midpole of the right kidney. No renal calculi or hydronephrosis. Mild circumferential bladder wall thickening is likely related to under distension. Stomach/Bowel: Stomach is within normal limits. Appendix appears normal. No evidence of bowel wall thickening, distention, or inflammatory changes. Few scattered colonic diverticula. Lymphatic: No enlarged abdominal or pelvic lymph nodes. Reproductive: Prostate is unremarkable. Other: No abdominal wall hernia or abnormality. No abdominopelvic ascites. No pneumoperitoneum. Musculoskeletal: No acute or significant osseous findings. Review of the MIP images confirms the above findings. IMPRESSION: Vascular: 1. No evidence of aortic dissection or aneurysm. 2. 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 3. Aortic Atherosclerosis (ICD10-I70.0). Chest: 1.  No acute intrathoracic process. 2. 4 mm subpleural nodule in the left lower lobe. No follow-up needed if patient  is low-risk. Non-contrast chest CT can be considered in 12 months if patient is high-risk. This recommendation follows the consensus statement: Guidelines for Management of Incidental Pulmonary Nodules Detected on CT Images: From the Fleischner Society 2017; Radiology 2017; 284:228-243. Abdomen pelvis: 1.  No acute intra-abdominal process. 2. Hepatic steatosis. Electronically Signed   By: Obie DredgeWilliam T Derry M.D.   On: 12/18/2020 12:27    Cardiac Studies   Echo: 12/18/20  IMPRESSIONS     1. Left ventricular ejection fraction, by estimation, is 50 to 55%. The  left ventricle has normal function. Left ventricular endocardial border  not optimally defined to evaluate regional wall motion. Left ventricular  diastolic parameters are  indeterminate.   2. Right ventricular systolic function is normal. The right ventricular  size  is normal. Tricuspid regurgitation signal is inadequate for assessing  PA pressure.   3. The mitral valve is normal in structure. No evidence of mitral valve  regurgitation. No evidence of mitral stenosis.   4. The aortic valve is tricuspid. There is mild calcification of the  aortic valve. There is mild thickening of the aortic valve. Aortic valve  regurgitation is not visualized. No aortic stenosis is present.   5. The inferior vena cava is normal in size with greater than 50%  respiratory variability, suggesting right atrial pressure of 3 mmHg.   FINDINGS   Left Ventricle: Left ventricular ejection fraction, by estimation, is 50  to 55%. The left ventricle has normal function. Left ventricular  endocardial border not optimally defined to evaluate regional wall motion.  The left ventricular internal cavity  size was normal in size. There is no left ventricular hypertrophy. Left  ventricular diastolic parameters are indeterminate.   Right Ventricle: The right ventricular size is normal. No increase in  right ventricular wall thickness. Right ventricular systolic function is  normal. Tricuspid regurgitation signal is inadequate for assessing PA  pressure.   Left Atrium: Left atrial size was normal in size.   Right Atrium: Right atrial size was normal in size.   Pericardium: There is no evidence of pericardial effusion.   Mitral Valve: The mitral valve is normal in structure. No evidence of  mitral valve regurgitation. No evidence of mitral valve stenosis. MV peak  gradient, 5.1 mmHg. The mean mitral valve gradient is 1.0 mmHg.   Tricuspid Valve: The tricuspid valve is normal in structure. Tricuspid  valve regurgitation is not demonstrated. No evidence of tricuspid  stenosis.   Aortic Valve: The aortic valve is tricuspid. There is mild calcification  of the aortic valve. There is mild thickening of the aortic valve. There  is mild aortic valve annular calcification. Aortic valve  regurgitation is  not visualized. No aortic stenosis   is present. Aortic valve mean gradient measures 8.5 mmHg. Aortic valve  peak gradient measures 17.7 mmHg. Aortic valve area, by VTI measures 2.15  cm.   Pulmonic Valve: The pulmonic valve was not well visualized. Pulmonic valve  regurgitation is not visualized. No evidence of pulmonic stenosis.   Aorta: The aortic root is normal in size and structure.   Venous: The inferior vena cava is normal in size with greater than 50%  respiratory variability, suggesting right atrial pressure of 3 mmHg.   IAS/Shunts: No atrial level shunt detected by color flow Doppler.   Patient Profile     60 y.o. male with a hx of HTN, hyperlipidemia, prior tobacco history who is being seen 12/18/2020 for the evaluation of chest pain at the request  of Dr Jacqulyn Bath.   Assessment & Plan    NSTEMI: hsTn peaked at 9116. Placed on IV heparin and transferred from [facility] to [facility] for further work up with cardiac cath. Echo noted EF of 50-55% unable to determine WMA.  -- remains on IV heparin, ASA, statin -- planned for cardiac cath today  HLD: LDL 92 -- on atorvastatin 40mg , will further increase to 80mg   PVCs: supplemented K+ -- bradycardic, unable to add BB therapy at present  Hypokalemia: K+ 3.4, suppl  OSA: reports using Cpap in the past but has not been complaint over the past several years.  -- will need outpatient sleep study  HTN: on combo amlodipine/benazepril. Reports blood pressures have not been well controlled at home. Will plan for further adjustment post cath -- no room for BB with bradycardia    For questions or updates, please contact [facility] Please consult [email or web address] for contact info under        Signed, , NP  12/19/2020, 9:35 AM

## 2020-12-19 NOTE — Progress Notes (Signed)
ANTICOAGULATION CONSULT NOTE  Pharmacy Consult for heparin Indication: chest pain/ACS/STEMI  Labs: Recent Labs    12/18/20 0950 12/18/20 1345 12/18/20 1524 12/18/20 2237 12/19/20 0017  HGB 16.9  --   --   --  16.4  HCT 49.0  --   --   --  48.4  PLT 142*  --   --   --  145*  HEPARINUNFRC  --   --   --   --  0.28*  CREATININE 0.90  --   --   --   --   TROPONINIHS 31* 234* 601* 7,987*  --    Assessment: Mr. Tamargo 60 yo male history of hyperlipidemia, HTN, former tobacco, family history of MI in several paternal uncles presents with chest pain. He has chest tightness with feeling very clammy, numbness radiating down his right arm, some associated SOB. Patient not on any oral anticoagulants. Pharmacy asked to start heparin Initial heparin level 0.28 units/ml Goal of Therapy:  Heparin level 0.3-0.7 units/ml Monitor platelets by anticoagulation protocol: Yes   Plan:  Increase heparin to 1400 units/hr Check heparin level in 6 hours  Thanks for allowing pharmacy to be a part of this patient's care.  Talbert Cage, PharmD Clinical Pharmacist 12/19/2020,1:05 AM

## 2020-12-20 ENCOUNTER — Ambulatory Visit (HOSPITAL_COMMUNITY)
Admission: RE | Admit: 2020-12-20 | Payer: BC Managed Care – PPO | Source: Home / Self Care | Admitting: Interventional Cardiology

## 2020-12-20 ENCOUNTER — Inpatient Hospital Stay (HOSPITAL_COMMUNITY)
Admission: EM | Disposition: A | Discharge status: Home / Self Care | Payer: Self-pay | Source: Home / Self Care | Attending: Internal Medicine

## 2020-12-20 ENCOUNTER — Other Ambulatory Visit (HOSPITAL_COMMUNITY): Payer: Self-pay

## 2020-12-20 ENCOUNTER — Encounter (HOSPITAL_COMMUNITY): Payer: Self-pay | Admitting: Interventional Cardiology

## 2020-12-20 DIAGNOSIS — I25110 Atherosclerotic heart disease of native coronary artery with unstable angina pectoris: Secondary | ICD-10-CM

## 2020-12-20 HISTORY — PX: CORONARY ATHERECTOMY: CATH118238

## 2020-12-20 HISTORY — PX: INTRAVASCULAR ULTRASOUND/IVUS: CATH118244

## 2020-12-20 LAB — CBC
HCT: 44 % (ref 39.0–52.0)
Hemoglobin: 14.9 g/dL (ref 13.0–17.0)
MCH: 29.4 pg (ref 26.0–34.0)
MCHC: 33.9 g/dL (ref 30.0–36.0)
MCV: 86.8 fL (ref 80.0–100.0)
Platelets: 119 10*3/uL — ABNORMAL LOW (ref 150–400)
RBC: 5.07 MIL/uL (ref 4.22–5.81)
RDW: 13.7 % (ref 11.5–15.5)
WBC: 6.5 10*3/uL (ref 4.0–10.5)
nRBC: 0 % (ref 0.0–0.2)

## 2020-12-20 LAB — BASIC METABOLIC PANEL
Anion gap: 4 — ABNORMAL LOW (ref 5–15)
BUN: 12 mg/dL (ref 6–20)
CO2: 26 mmol/L (ref 22–32)
Calcium: 8.6 mg/dL — ABNORMAL LOW (ref 8.9–10.3)
Chloride: 108 mmol/L (ref 98–111)
Creatinine, Ser: 0.91 mg/dL (ref 0.61–1.24)
GFR, Estimated: >60 mL/min (ref >60)
Glucose, Bld: 91 mg/dL (ref 70–99)
Potassium: 3.7 mmol/L (ref 3.5–5.1)
Sodium: 138 mmol/L (ref 135–145)

## 2020-12-20 LAB — POCT ACTIVATED CLOTTING TIME
Activated Clotting Time: 457 seconds
Activated Clotting Time: 353 seconds

## 2020-12-20 LAB — HEPARIN LEVEL (UNFRACTIONATED): Heparin Unfractionated: 0.3 IU/mL (ref 0.30–0.70)

## 2020-12-20 SURGERY — CORONARY ATHERECTOMY
Anesthesia: LOCAL

## 2020-12-20 MED ORDER — OXYCODONE HCL 5 MG PO TABS
5.0000 mg | ORAL_TABLET | ORAL | Status: DC | PRN
Start: 2020-12-20 — End: 2020-12-21
  Administered 2020-12-20: 10 mg via ORAL
  Filled 2020-12-20: qty 2

## 2020-12-20 MED ORDER — HEPARIN SODIUM (PORCINE) 1000 UNIT/ML IJ SOLN
INTRAMUSCULAR | Status: AC
  Filled 2020-12-20: qty 1

## 2020-12-20 MED ORDER — FENTANYL CITRATE (PF) 100 MCG/2ML IJ SOLN
INTRAMUSCULAR | Status: DC | PRN
  Administered 2020-12-20: 25 ug via INTRAVENOUS
  Administered 2020-12-20 (×2): 50 ug via INTRAVENOUS

## 2020-12-20 MED ORDER — FENTANYL CITRATE (PF) 100 MCG/2ML IJ SOLN
INTRAMUSCULAR | Status: AC
  Filled 2020-12-20: qty 2

## 2020-12-20 MED ORDER — SPIRONOLACTONE 25 MG PO TABS
25.0000 mg | ORAL_TABLET | Freq: Every day | ORAL | Status: DC
  Administered 2020-12-21: 25 mg via ORAL
  Filled 2020-12-20: qty 1

## 2020-12-20 MED ORDER — TICAGRELOR 90 MG PO TABS
90.0000 mg | ORAL_TABLET | Freq: Two times a day (BID) | ORAL | Status: DC
  Administered 2020-12-20 – 2020-12-21 (×2): 90 mg via ORAL
  Filled 2020-12-20 (×2): qty 1

## 2020-12-20 MED ORDER — MIDAZOLAM HCL 2 MG/2ML IJ SOLN
INTRAMUSCULAR | Status: AC
  Filled 2020-12-20: qty 2

## 2020-12-20 MED ORDER — SODIUM CHLORIDE 0.9% FLUSH: 3.0000 mL | Freq: Two times a day (BID) | INTRAVENOUS | Status: DC

## 2020-12-20 MED ORDER — LABETALOL HCL 5 MG/ML IV SOLN: 10.0000 mg | INTRAVENOUS | Status: AC | PRN

## 2020-12-20 MED ORDER — TICAGRELOR 90 MG PO TABS: 90.0000 mg | ORAL_TABLET | Freq: Two times a day (BID) | ORAL | Status: DC

## 2020-12-20 MED ORDER — BIVALIRUDIN TRIFLUOROACETATE 250 MG IV SOLR
INTRAVENOUS | Status: AC
  Filled 2020-12-20: qty 250

## 2020-12-20 MED ORDER — SODIUM CHLORIDE 0.9% FLUSH: 3.0000 mL | INTRAVENOUS | Status: DC | PRN

## 2020-12-20 MED ORDER — HEPARIN (PORCINE) IN NACL 1000-0.9 UT/500ML-% IV SOLN
INTRAVENOUS | Status: AC
  Filled 2020-12-20: qty 1000

## 2020-12-20 MED ORDER — SODIUM CHLORIDE 0.9 % IV SOLN
INTRAVENOUS | Status: DC | PRN
  Administered 2020-12-20 (×2): 1.75 mg/kg/h via INTRAVENOUS

## 2020-12-20 MED ORDER — VIPERSLIDE LUBRICANT OPTIME
TOPICAL | Status: DC | PRN
  Administered 2020-12-20: 20 mL via SURGICAL_CAVITY

## 2020-12-20 MED ORDER — VERAPAMIL HCL 2.5 MG/ML IV SOLN
INTRAVENOUS | Status: AC
  Filled 2020-12-20: qty 2

## 2020-12-20 MED ORDER — LIDOCAINE HCL (PF) 1 % IJ SOLN
INTRAMUSCULAR | Status: DC | PRN
  Administered 2020-12-20: 15 mL

## 2020-12-20 MED ORDER — LIDOCAINE HCL (PF) 1 % IJ SOLN
INTRAMUSCULAR | Status: AC
  Filled 2020-12-20: qty 30

## 2020-12-20 MED ORDER — NITROGLYCERIN 1 MG/10 ML FOR IR/CATH LAB
INTRA_ARTERIAL | Status: AC
  Filled 2020-12-20: qty 10

## 2020-12-20 MED ORDER — SODIUM CHLORIDE 0.9 % IV SOLN: 250.0000 mL | INTRAVENOUS | Status: DC | PRN

## 2020-12-20 MED ORDER — BIVALIRUDIN BOLUS VIA INFUSION - CUPID
INTRAVENOUS | Status: DC | PRN
  Administered 2020-12-20: 89.175 mg via INTRAVENOUS

## 2020-12-20 MED ORDER — ASPIRIN 81 MG PO CHEW: 81.0000 mg | CHEWABLE_TABLET | Freq: Every day | ORAL | Status: DC

## 2020-12-20 MED ORDER — HYDRALAZINE HCL 20 MG/ML IJ SOLN
10.0000 mg | INTRAMUSCULAR | Status: AC | PRN
  Administered 2020-12-20: 10 mg via INTRAVENOUS
  Filled 2020-12-20: qty 1

## 2020-12-20 MED ORDER — ENOXAPARIN SODIUM 40 MG/0.4ML IJ SOSY: 40.0000 mg | PREFILLED_SYRINGE | INTRAMUSCULAR | Status: DC

## 2020-12-20 MED ORDER — MIDAZOLAM HCL 2 MG/2ML IJ SOLN
INTRAMUSCULAR | Status: DC | PRN
  Administered 2020-12-20: 1 mg via INTRAVENOUS
  Administered 2020-12-20 (×3): 0.5 mg via INTRAVENOUS
  Administered 2020-12-20: 1 mg via INTRAVENOUS

## 2020-12-20 MED ORDER — ACETAMINOPHEN 325 MG PO TABS: 650.0000 mg | ORAL_TABLET | ORAL | Status: DC | PRN

## 2020-12-20 MED ORDER — IOHEXOL 350 MG/ML SOLN
INTRAVENOUS | Status: DC | PRN
  Administered 2020-12-20: 200 mL

## 2020-12-20 MED ORDER — HEPARIN (PORCINE) IN NACL 1000-0.9 UT/500ML-% IV SOLN
INTRAVENOUS | Status: DC | PRN
  Administered 2020-12-20 (×2): 500 mL

## 2020-12-20 MED ORDER — ONDANSETRON HCL 4 MG/2ML IJ SOLN: 4.0000 mg | Freq: Four times a day (QID) | INTRAMUSCULAR | Status: DC | PRN

## 2020-12-20 MED ORDER — SODIUM CHLORIDE 0.9 % IV SOLN: INTRAVENOUS | Status: AC

## 2020-12-20 SURGICAL SUPPLY — 24 items
BALLN  ~~LOC~~ SAPPHIRE 5.0X15 (BALLOONS) ×1
BALLN SAPPHIRE 3.5X20 (BALLOONS) ×2
BALLN ~~LOC~~ SAPPHIRE 5.0X15 (BALLOONS) ×1
BALLOON SAPPHIRE 3.5X20 (BALLOONS) ×1 IMPLANT
BALLOON ~~LOC~~ SAPPHIRE 5.0X15 (BALLOONS) ×1 IMPLANT
CATH OPTICROSS HD (CATHETERS) ×2 IMPLANT
CATH VISTA GUIDE 6FR XB3.5 (CATHETERS) ×2 IMPLANT
CROWN DIAMONDBACK CLASSIC 1.25 (BURR) ×2 IMPLANT
ELECT DEFIB PAD ADLT CADENCE (PAD) ×2 IMPLANT
KIT ENCORE 26 ADVANTAGE (KITS) ×2 IMPLANT
KIT HEART LEFT (KITS) ×2 IMPLANT
LUBRICANT VIPERSLIDE CORONARY (MISCELLANEOUS) ×2 IMPLANT
PACK CARDIAC CATHETERIZATION (CUSTOM PROCEDURE TRAY) ×2 IMPLANT
SHEATH PINNACLE 6F 10CM (SHEATH) ×2 IMPLANT
SHEATH PROBE COVER 6X72 (BAG) ×2 IMPLANT
SLED PULL BACK IVUS (MISCELLANEOUS) ×2 IMPLANT
STENT SYNERGY XD 4.0X28 (Permanent Stent) ×1 IMPLANT
SYNERGY XD 4.0X28 (Permanent Stent) ×2 IMPLANT
TRANSDUCER W/STOPCOCK (MISCELLANEOUS) ×2 IMPLANT
TUBING CIL FLEX 10 FLL-RA (TUBING) ×2 IMPLANT
WIRE ASAHI PROWATER 180CM (WIRE) ×2 IMPLANT
WIRE HI TORQ BMW 190CM (WIRE) ×2 IMPLANT
WIRE HI TORQ VERSACORE J 260CM (WIRE) ×2 IMPLANT
WIRE VIPERWIRE COR FLEX .012 (WIRE) ×2 IMPLANT

## 2020-12-20 NOTE — Interval H&P Note (Signed)
Cath Lab Visit (complete for each Cath Lab visit)  Clinical Evaluation Leading to the Procedure:   ACS: Yes.    Non-ACS:    Anginal Classification: CCS III  Anti-ischemic medical therapy: Minimal Therapy (1 class of medications)  Non-Invasive Test Results: No non-invasive testing performed  Prior CABG: No previous CABG      History and Physical Interval Note:  12/20/2020 9:59 AM  The patient underwent diagnostic catheterization on 12/19/2020.  He had the procedure performed from the right radial approach.  Significant tortuosity was noted in the shoulder and at the entry into the ascending aorta from the innominate artery making catheter manipulation difficult.  Catheterization identified a totally occluded distal circumflex which could possibly represent the culprit although no collaterals or evidence of staining was noted.  The most significant finding was heavily calcified LAD system with proximal eccentric 85 to 90% stenosis forming a Medina 110 configuration with a large branching diagonal.  The right coronary contain mid moderate stenosis.  After discussing with colleagues ([place] and [place]) felt that the best approach would be atherectomy with stenting of the proximal LAD using provisional management of the diagonal.  Did not feel that this treatment strategy would remove surgical options in the future if needed.  Higher than usual risk discussed with the patient and his wife.  Femoral approach has been chosen.  No overnight complications or chest pain.  Nicholas Andersen  has presented today for surgery, with the diagnosis of cad.  The various methods of treatment have been discussed with the patient and family. After consideration of risks, benefits and other options for treatment, the patient has consented to  Procedure(s): CORONARY ATHERECTOMY (N/A) as a surgical intervention.  The patient's history has been reviewed, patient examined, no change in status, stable for surgery.  I  have reviewed the patient's chart and labs.  Questions were answered to the patient's satisfaction.     Lyn Records III

## 2020-12-20 NOTE — Progress Notes (Signed)
ANTICOAGULATION CONSULT NOTE  Pharmacy Consult for heparin Indication: chest pain/ACS/STEMI  Labs: Recent Labs    12/18/20 0950 12/18/20 1345 12/18/20 1524 12/18/20 2237 12/19/20 0017 12/19/20 0611 12/19/20 1557 12/19/20 1720 12/20/20 0729  HGB 16.9  --   --   --  16.4  --   --  15.5  --   HCT 49.0  --   --   --  48.4  --   --  45.5  --   PLT 142*  --   --   --  145*  --   --  120*  --   HEPARINUNFRC  --   --   --   --  0.28* 0.42  --   --  0.30  CREATININE 0.90  --   --   --  0.94 0.90 0.91  --   --   TROPONINIHS 31*   < > 601* 7,987* 9,116*  --   --   --   --    < > = values in this interval not displayed.   Assessment: Mr. Mckowen 60 yo male history of hyperlipidemia, HTN, former tobacco, family history of MI in several paternal uncles presents with chest pain. He has chest tightness with feeling very clammy, numbness radiating down his right arm, some associated SOB. Patient not on any oral anticoagulants. Pharmacy asked to start heparin.  Heparin level 0.3 on 1400 units/hr. Hemoglobin stable 15s, plt low stable in 120. No bleeding or IV issues noted.    Goal of Therapy:  Heparin level 0.3-0.7 units/ml Monitor platelets by anticoagulation protocol: Yes   Plan:  Continue heparin at 1400 units/hr Follow up after cath  Thanks for allowing pharmacy to be a part of this patient's care.  Sheppard Coil PharmD., BCPS Clinical Pharmacist 12/20/2020 8:25 AM

## 2020-12-20 NOTE — CV Procedure (Signed)
Orbital atherectomy followed by balloon angioplasty and stenting reducing eccentric heavily calcified greater than 80% stenosis with a Medina 111 bifurcation anatomy reducing the LAD stenosis to less than 10% with TIMI grade III flow.  Persistent eccentric 50% stenosis in the diagonal. A 28 x 4.0 Synergy was postdilated to 5.0 in the proximal 15 mm of the stent.

## 2020-12-20 NOTE — TOC Benefit Eligibility Note (Signed)
Patient Product/process development scientist completed.    The patient is currently admitted and upon discharge could be taking Brilinta 90 mg.  The current 30 day co-pay is, $25.00.   The patient is insured through [employer]     Roland Earl, CPhT Pharmacy Patient Advocate Specialist [facility] Antimicrobial Stewardship Team Direct Number: [phone]  Fax: [phone]

## 2020-12-20 NOTE — Progress Notes (Addendum)
Progress Note  Patient Name: Nicholas Andersen Date of Encounter: 12/20/2020  [facility] Cardiologist: None   Subjective   No chest pain overnight.   Inpatient Medications    Scheduled Meds:  allopurinol  300 mg Oral Daily   amLODipine  10 mg Oral Daily   And   benazepril  20 mg Oral Daily   aspirin  81 mg Oral Daily   aspirin EC  81 mg Oral Daily   atorvastatin  80 mg Oral q1800   pneumococcal 23 valent vaccine  0.5 mL Intramuscular Tomorrow-1000   sodium chloride flush  3 mL Intravenous Q12H   sodium chloride flush  3 mL Intravenous Q12H   Continuous Infusions:  sodium chloride     sodium chloride     sodium chloride 1 mL/kg/hr (12/20/20 0713)   heparin 1,400 Units/hr (12/19/20 2301)   PRN Meds: sodium chloride, sodium chloride, acetaminophen, nitroGLYCERIN, ondansetron (ZOFRAN) IV, oxyCODONE, sodium chloride flush, sodium chloride flush   Vital Signs    Vitals:   12/19/20 2100 12/20/20 0045 12/20/20 0446 12/20/20 0808  BP: 125/86 (!) 104/51 (!) 142/76 (!) 146/87  Pulse: 61 (!) 55 (!) 53 (!) 56  Resp: Temp: 98.1 F (36.7 C)  98 F (36.7 C) 97.9 F (36.6 C)  TempSrc: Oral  Oral Oral  SpO2: 94% 96% 96% 98%  Weight:   118.9 kg   Height:        Intake/Output Summary (Last 24 hours) at 12/20/2020 0818 Last data filed at 12/20/2020 0800 Gross per 24 hour  Intake 576.81 ml  Output 1820 ml  Net -1243.19 ml   Last 3 Weights 12/20/2020 12/18/2020 12/18/2020  Weight (lbs) 262 lb 3.2 oz 264 lb 4.8 oz 270 lb  Weight (kg) 118.933 kg 119.886 kg 122.471 kg      Telemetry    SB - Personally Reviewed  ECG    SB 57 bpm - Personally Reviewed  Physical Exam   GEN: No acute distress.   Neck: No JVD Cardiac: RRR, no murmurs, rubs, or gallops.  Respiratory: Clear to auscultation bilaterally. GI: Soft, nontender, non-distended  MS: No edema; No deformity. Right radial cath site stable.  Neuro:  Nonfocal  Psych: Normal affect   Labs    High  Sensitivity Troponin:   Recent Labs  Lab 12/18/20 0950 12/18/20 1345 12/18/20 1524 12/18/20 2237 12/19/20 0017  TROPONINIHS 31* 234* 601* 7,987* 9,116*      Chemistry Recent Labs  Lab 12/18/20 0950 12/19/20 0017 12/19/20 0611 12/19/20 1557  NA 136 138 137 136  K 3.3* 3.4* 3.4* 3.8  CL 101 104 104 103  CO2 GLUCOSE 126* 92 94 89  BUN CREATININE 0.90 0.94 0.90 0.91  CALCIUM 9.5 8.9 8.6* 8.7*  PROT 8.2*  --   --   --   ALBUMIN 4.5  --   --   --   AST 31  --   --   --   ALT 29  --   --   --   ALKPHOS 102  --   --   --   BILITOT 0.7  --   --   --   GFRNONAA >60 >60 >60 >60  ANIONGAP Hematology Recent Labs  Lab 12/18/20 0950 12/19/20 0017 12/19/20 1720  WBC 7.6 9.0 6.5  RBC 5.68 5.66 5.28  HGB 16.9 16.4 15.5  HCT 49.0 48.4 45.5  MCV 86.3 85.5 86.2  MCH 29.8 29.0 29.4  MCHC 34.5 33.9 34.1  RDW 13.5 13.6 13.6  PLT 142* 145* 120*    BNPNo results for input(s): BNP, PROBNP in the last 168 hours.   DDimer No results for input(s): DDIMER in the last 168 hours.   Radiology    CT Head Wo Contrast  Result Date: 12/18/2020 CLINICAL DATA:  Bilateral arm tingling. EXAM: CT HEAD WITHOUT CONTRAST TECHNIQUE: Contiguous axial images were obtained from the base of the skull through the vertex without intravenous contrast. COMPARISON:  None. FINDINGS: Brain: No evidence of acute infarction, hemorrhage, hydrocephalus, extra-axial collection or mass lesion/mass effect. Mild generalized cerebral atrophy. Vascular: Atherosclerotic vascular calcification of the carotid siphons. No hyperdense vessel. Skull: Normal. Negative for fracture or focal lesion. Sinuses/Orbits: Extensive bilateral paranasal sinus disease. The orbits are unremarkable. Other: None. IMPRESSION: 1. No acute intracranial abnormality. 2. Extensive bilateral paranasal sinus disease. Electronically Signed   By: Obie Dredge M.D.   On: 12/18/2020 12:14   CARDIAC  CATHETERIZATION  Addendum Date: 12/19/2020    Total occlusion of distal circumflex, possibly culprit for ACS/MI.  Significant left coronary calcification.  Proximal LAD bifurcation stenosis with [place] 110 heavily calcified bifurcation stenosis of 80%.  60% more distal in the mid LAD.  The diagonal at the proximal bifurcation  Right coronary is dominant and there is mid eccentric 40 to 50% narrowing.  Normal LVEDP  LVEF by echo was normal RECOMMENDATIONS:  Technically difficult procedure from right radial approach due to angulation at the subclavian/innominate artery and innominate aortic bifurcation.  Start Brilinta.  Orbital atherectomy LAD with planned provisional stenting.  Result Date: 12/19/2020  LV end diastolic pressure is normal.    DG Chest Portable 1 View  Result Date: 12/18/2020 CLINICAL DATA:  Chest pain, pain in center of chest with bilateral arm tingling. EXAM: PORTABLE CHEST 1 VIEW COMPARISON:  None FINDINGS: EKG leads project over the chest. Trachea is midline. Cardiomediastinal contours and hilar structures are normal. Lungs are clear.  No sign of effusion or pneumothorax. On limited assessment there is no acute skeletal process. IMPRESSION: No acute cardiopulmonary disease. Electronically Signed   By: Donzetta Kohut M.D.   On: 12/18/2020 10:08   ECHOCARDIOGRAM COMPLETE  Result Date: 12/18/2020    ECHOCARDIOGRAM REPORT   Patient Name:   Nicholas Andersen Date of Exam: 12/18/2020 Medical Rec #:  [id number]      Height:       71.0 in Accession #:    [id number]     Weight:       270.0 lb Date of Birth:  09/05/1960     BSA:          2.395 m Patient Age:    60 years       BP:           177/99 mmHg Patient Gender: M              HR:           51 bpm. [facility] Procedure: 2D Echo, Cardiac Doppler and Color Doppler Indications:    Chest Pain  History:        Patient has no prior history of Echocardiogram examinations.                 Signs/Symptoms:Chest Pain.   Sonographer:    Mikki Harbor Referring Phys: [id number] Dorothe Pea BRANCH IMPRESSIONS  1. Left ventricular ejection  fraction, by estimation, is 50 to 55%. The left ventricle has normal function. Left ventricular endocardial border not optimally defined to evaluate regional wall motion. Left ventricular diastolic parameters are indeterminate.  2. Right ventricular systolic function is normal. The right ventricular size is normal. Tricuspid regurgitation signal is inadequate for assessing PA pressure.  3. The mitral valve is normal in structure. No evidence of mitral valve regurgitation. No evidence of mitral stenosis.  4. The aortic valve is tricuspid. There is mild calcification of the aortic valve. There is mild thickening of the aortic valve. Aortic valve regurgitation is not visualized. No aortic stenosis is present.  5. The inferior vena cava is normal in size with greater than 50% respiratory variability, suggesting right atrial pressure of 3 mmHg. FINDINGS  Left Ventricle: Left ventricular ejection fraction, by estimation, is 50 to 55%. The left ventricle has normal function. Left ventricular endocardial border not optimally defined to evaluate regional wall motion. The left ventricular internal cavity size was normal in size. There is no left ventricular hypertrophy. Left ventricular diastolic parameters are indeterminate. Right Ventricle: The right ventricular size is normal. No increase in right ventricular wall thickness. Right ventricular systolic function is normal. Tricuspid regurgitation signal is inadequate for assessing PA pressure. Left Atrium: Left atrial size was normal in size. Right Atrium: Right atrial size was normal in size. Pericardium: There is no evidence of pericardial effusion. Mitral Valve: The mitral valve is normal in structure. No evidence of mitral valve regurgitation. No evidence of mitral valve stenosis. MV peak gradient, 5.1 mmHg. The mean mitral valve gradient is 1.0 mmHg.  Tricuspid Valve: The tricuspid valve is normal in structure. Tricuspid valve regurgitation is not demonstrated. No evidence of tricuspid stenosis. Aortic Valve: The aortic valve is tricuspid. There is mild calcification of the aortic valve. There is mild thickening of the aortic valve. There is mild aortic valve annular calcification. Aortic valve regurgitation is not visualized. No aortic stenosis  is present. Aortic valve mean gradient measures 8.5 mmHg. Aortic valve peak gradient measures 17.7 mmHg. Aortic valve area, by VTI measures 2.15 cm. Pulmonic Valve: The pulmonic valve was not well visualized. Pulmonic valve regurgitation is not visualized. No evidence of pulmonic stenosis. Aorta: The aortic root is normal in size and structure. Venous: The inferior vena cava is normal in size with greater than 50% respiratory variability, suggesting right atrial pressure of 3 mmHg. IAS/Shunts: No atrial level shunt detected by color flow Doppler.  LEFT VENTRICLE PLAX 2D LVIDd:         4.68 cm  Diastology LVIDs:         3.40 cm  LV e' medial:    6.40 cm/s LV PW:         1.02 cm  LV E/e' medial:  15.5 LV IVS:        1.00 cm  LV e' lateral:   11.60 cm/s LVOT diam:     2.00 cm  LV E/e' lateral: 8.5 LV SV:         104 LV SV Index:   44 LVOT Area:     3.14 cm  RIGHT VENTRICLE RV Basal diam:  4.12 cm RV Mid diam:    3.51 cm RV S prime:     11.50 cm/s TAPSE (M-mode): 3.1 cm LEFT ATRIUM             Index       RIGHT ATRIUM           Index LA  diam:        4.30 cm 1.80 cm/m  RA Area:     14.90 cm LA Vol (A2C):   78.9 ml 32.94 ml/m RA Volume:   36.90 ml  15.40 ml/m LA Vol (A4C):   68.8 ml 28.72 ml/m LA Biplane Vol: 79.4 ml 33.15 ml/m  AORTIC VALVE AV Area (Vmax):    2.18 cm AV Area (Vmean):   2.10 cm AV Area (VTI):     2.15 cm AV Vmax:           210.50 cm/s AV Vmean:          134.500 cm/s AV VTI:            0.486 m AV Peak Grad:      17.7 mmHg AV Mean Grad:      8.5 mmHg LVOT Vmax:         146.00 cm/s LVOT Vmean:         90.000 cm/s LVOT VTI:          0.332 m LVOT/AV VTI ratio: 0.68  AORTA Ao Root diam: 3.10 cm Ao Asc diam:  3.60 cm MITRAL VALVE MV Area (PHT): 3.33 cm    SHUNTS MV Area VTI:   2.37 cm    Systemic VTI:  0.33 m MV Peak grad:  5.1 mmHg    Systemic Diam: 2.00 cm MV Mean grad:  1.0 mmHg MV Vmax:       1.13 m/s MV Vmean:      52.5 cm/s MV Decel Time: 228 msec MV E velocity: 99.00 cm/s MV A velocity: 91.30 cm/s MV E/A ratio:  1.08 Dina Rich MD Electronically signed by Dina Rich MD Signature Date/Time: 12/18/2020/5:12:30 PM    Final    CT Angio Chest/Abd/Pel for Dissection W and/or Wo Contrast  Result Date: 12/18/2020 CLINICAL DATA:  Acute onset central chest and right shoulder blade pain. EXAM: CT ANGIOGRAPHY CHEST, ABDOMEN AND PELVIS TECHNIQUE: Non-contrast CT of the chest was initially obtained. Multidetector CT imaging through the chest, abdomen and pelvis was performed using the standard protocol during bolus administration of intravenous contrast. Multiplanar reconstructed images and MIPs were obtained and reviewed to evaluate the vascular anatomy. CONTRAST:  OMNIPAQUE IOHEXOL 350 MG/ML SOLN COMPARISON:  Chest x-ray from same day. FINDINGS: CTA CHEST FINDINGS Cardiovascular: Preferential opacification of the thoracic aorta. No evidence of thoracic aortic aneurysm or dissection. Normal heart size. Three-vessel coronary artery calcification. No pericardial effusion. No central pulmonary embolism. Mediastinum/Nodes: No enlarged mediastinal, hilar, or axillary lymph nodes. Thyroid gland, trachea, and esophagus demonstrate no significant findings. Lungs/Pleura: Lungs are clear. No pleural effusion or pneumothorax. 4 mm subpleural nodule in the superior segment of the left lower lobe (series 9, image 74). Musculoskeletal: No chest wall abnormality. No acute or significant osseous findings. Review of the MIP images confirms the above findings. CTA ABDOMEN AND PELVIS FINDINGS VASCULAR Aorta: Normal  caliber aorta without aneurysm, dissection, vasculitis or significant stenosis. Mild atherosclerotic calcification. Celiac: Patent. 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 SMA: Patent without evidence of aneurysm, dissection, vasculitis or significant stenosis. Renals: Both renal arteries are patent without evidence of aneurysm, dissection, vasculitis, fibromuscular dysplasia or significant stenosis. IMA: Patent without evidence of aneurysm, dissection, vasculitis or significant stenosis. Inflow: Patent without evidence of aneurysm, dissection, vasculitis or significant stenosis. Veins: No obvious venous abnormality within the limitations of this arterial phase study. Review of the MIP images confirms the above findings.  NON-VASCULAR Hepatobiliary: Diffusely decreased liver density without focal abnormality. The gallbladder is unremarkable. No biliary dilatation. Pancreas: Unremarkable. No pancreatic ductal dilatation or surrounding inflammatory changes. Spleen: Normal in size without focal abnormality. Adrenals/Urinary Tract: The adrenal glands are unremarkable. 1.2 cm simple cyst in the central midpole of the right kidney. No renal calculi or hydronephrosis. Mild circumferential bladder wall thickening is likely related to under distension. Stomach/Bowel: Stomach is within normal limits. Appendix appears normal. No evidence of bowel wall thickening, distention, or inflammatory changes. Few scattered colonic diverticula. Lymphatic: No enlarged abdominal or pelvic lymph nodes. Reproductive: Prostate is unremarkable. Other: No abdominal wall hernia or abnormality. No abdominopelvic ascites. No pneumoperitoneum. Musculoskeletal: No acute or significant osseous findings. Review of the MIP images confirms the above findings. IMPRESSION: Vascular: 1. No evidence of aortic dissection or aneurysm. 2. 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 3. Aortic Atherosclerosis (ICD10-I70.0). Chest: 1.  No acute intrathoracic process. 2. 4 mm subpleural nodule in the left lower lobe. No follow-up needed if patient is low-risk. Non-contrast chest CT can be considered in 12 months if patient is high-risk. This recommendation follows the consensus statement: Guidelines for Management of Incidental Pulmonary Nodules Detected on CT Images: From the Fleischner Society 2017; Radiology 2017; 284:228-243. Abdomen pelvis: 1.  No acute intra-abdominal process. 2. Hepatic steatosis. Electronically Signed   By: Obie Dredge M.D.   On: 12/18/2020 12:27    Cardiac Studies   Cath: 12/19/20  Total occlusion of distal circumflex, possibly culprit for ACS/MI. Significant left coronary calcification. Proximal LAD bifurcation stenosis with Medina 110 heavily calcified bifurcation stenosis of 80%.  60% more distal in the mid LAD.  The diagonal at the proximal bifurcation Right coronary is dominant and there is mid eccentric 40 to 50% narrowing. Normal LVEDP LVEF by echo was normal   RECOMMENDATIONS:   Technically difficult procedure from right radial approach due to angulation at the subclavian/innominate artery and innominate aortic bifurcation. Start Brilinta. Orbital atherectomy LAD with planned provisional stenting. Diagnostic Dominance: Right   Echo: 12/18/20  IMPRESSIONS     1. Left ventricular ejection fraction, by estimation, is 50 to 55%. The  left ventricle has normal function. Left ventricular endocardial border  not optimally defined to evaluate regional wall motion. Left ventricular  diastolic parameters are  indeterminate.   2. Right ventricular systolic function is normal. The right ventricular  size is normal. Tricuspid regurgitation signal is inadequate for assessing  PA pressure.   3. The mitral valve is normal in structure. No evidence of mitral valve  regurgitation. No  evidence of mitral stenosis.   4. The aortic valve is tricuspid. There is mild calcification of the  aortic valve. There is mild thickening of the aortic valve. Aortic valve  regurgitation is not visualized. No aortic stenosis is present.   5. The inferior vena cava is normal in size with greater than 50%  respiratory variability, suggesting right atrial pressure of 3 mmHg.   Patient Profile     60 y.o. male  with a hx of HTN, hyperlipidemia, prior tobacco history who is being seen 12/18/2020 for the evaluation of chest pain at the request of Dr Jacqulyn Bath.   Assessment & Plan    NSTEMI: hsTn peaked at 9116. Placed on IV heparin and transferred from [facility] to [facility] for further work up with cardiac cath. Echo noted EF of 50-55% unable to determine WMA. Underwent cardiac cath noted  above with total occlusion of dLcx (likely culprit) with pLAD 80% calcified lesion, RCA with 40-50%. Difficult access from right radial 2/2 angulation of subclavian. Planned for orbital atherectomy of LAD/stenting today. -- remains on IV heparin, ASA, statin. Started on Brilinta post cath   HLD: LDL 92 -- on atorvastatin , increased to  -- will need FLP/LFTs in 8 weeks   PVCs: supplemented K+ -- bradycardic, unable to add BB therapy at present   Hypokalemia: resolved    OSA: reports using Cpap in the past but has not been complaint over the past several years. -- will need outpatient sleep study   HTN: on combo amlodipine/benazepril. Reports blood pressures have not been well controlled at home. Still borderline controlled -- will add spiro  daily -- no room for BB with bradycardia  For questions or updates, please contact [facility] Please consult [email or web address] for contact info under        Signed, Laverda Page, NP  12/20/2020, 8:18 AM      ATTENDING ATTESTATION  I have seen, examined and evaluated the patient this AM in the Cath Lab (prior to PCI) after he was seen by Laverda Page,  NP-C After reviewing all the available data and chart, we discussed the patients laboratory, study & physical findings as well as symptoms in detail. I agree with her findings, examination as well as impression recommendations as per our discussion.     Cath films from yesterday reviewed with Dr. Katrinka Blazing.  Eccentric calcified lesion in the proximal LAD with a very focal segment of severe ~90% stenosis.  We both felt that the intravascular imaging with likely with atherectomy based PCI will be warranted.  Plan for staged PCI today.  Chest pain-free overnight.  Was loaded with Brilinta.  No issues. -> No beta-blocker due to bradycardia, but with elevated pressures we will start spironolactone.  Potassium level stable.  PVC stable.  Needs outpatient sleep study and followed up on.  He may feel better after PCI.    Bryan Lemma, M.D., M.S. Interventional Cardiologist   Pager # [phone] Phone # [phone] [address] [place], [place] [address]

## 2020-12-20 NOTE — Progress Notes (Signed)
12F sheath pull. Pt has sheath to right groin post PCI. Sheath pulled per MD order. VSS during pull. Manual pressure held for approx 30 min. Pt had palpable right PT before and after the sheath pull. Pt, and family given post sheath pull instructions and voiced understanding of same. Site clean and dry with gauze dressing and Tegaderm. Attending nurse Minerva Areola, RN assessed site post sheath pull.

## 2020-12-21 ENCOUNTER — Other Ambulatory Visit (HOSPITAL_COMMUNITY): Payer: Self-pay

## 2020-12-21 DIAGNOSIS — E785 Hyperlipidemia, unspecified: Secondary | ICD-10-CM

## 2020-12-21 DIAGNOSIS — Z955 Presence of coronary angioplasty implant and graft: Secondary | ICD-10-CM

## 2020-12-21 DIAGNOSIS — E7849 Other hyperlipidemia: Secondary | ICD-10-CM

## 2020-12-21 DIAGNOSIS — I10 Essential (primary) hypertension: Secondary | ICD-10-CM

## 2020-12-21 LAB — CBC
HCT: 45.5 % (ref 39.0–52.0)
Hemoglobin: 15.6 g/dL (ref 13.0–17.0)
MCH: 29.7 pg (ref 26.0–34.0)
MCHC: 34.3 g/dL (ref 30.0–36.0)
MCV: 86.5 fL (ref 80.0–100.0)
Platelets: 137 10*3/uL — ABNORMAL LOW (ref 150–400)
RBC: 5.26 MIL/uL (ref 4.22–5.81)
RDW: 13.5 % (ref 11.5–15.5)
WBC: 9 10*3/uL (ref 4.0–10.5)
nRBC: 0 % (ref 0.0–0.2)

## 2020-12-21 LAB — BASIC METABOLIC PANEL
Anion gap: 8 (ref 5–15)
BUN: 12 mg/dL (ref 6–20)
CO2: 26 mmol/L (ref 22–32)
Calcium: 9.2 mg/dL (ref 8.9–10.3)
Chloride: 102 mmol/L (ref 98–111)
Creatinine, Ser: 1.1 mg/dL (ref 0.61–1.24)
GFR, Estimated: >60 mL/min (ref >60)
Glucose, Bld: 88 mg/dL (ref 70–99)
Potassium: 3.8 mmol/L (ref 3.5–5.1)
Sodium: 136 mmol/L (ref 135–145)

## 2020-12-21 MED ORDER — SPIRONOLACTONE 25 MG PO TABS
25.0000 mg | ORAL_TABLET | Freq: Every day | ORAL | 1 refills | Status: DC
  Filled 2020-12-21: qty 30, 30d supply, fill #0

## 2020-12-21 MED ORDER — BENAZEPRIL HCL 40 MG PO TABS
40.0000 mg | ORAL_TABLET | Freq: Every day | ORAL | Status: DC
  Administered 2020-12-21: 40 mg via ORAL
  Filled 2020-12-21: qty 1

## 2020-12-21 MED ORDER — AMLODIPINE BESYLATE 10 MG PO TABS
10.0000 mg | ORAL_TABLET | Freq: Every day | ORAL | 0 refills | Status: DC
  Filled 2020-12-21: qty 30, 30d supply, fill #0

## 2020-12-21 MED ORDER — BENAZEPRIL HCL 40 MG PO TABS
40.0000 mg | ORAL_TABLET | Freq: Every day | ORAL | 0 refills | Status: DC
  Filled 2020-12-21: qty 30, 30d supply, fill #0

## 2020-12-21 MED ORDER — AMLODIPINE BESYLATE 10 MG PO TABS
10.0000 mg | ORAL_TABLET | Freq: Every day | ORAL | Status: DC
  Administered 2020-12-21: 10 mg via ORAL
  Filled 2020-12-21: qty 1

## 2020-12-21 MED ORDER — NITROGLYCERIN 0.4 MG SL SUBL
0.4000 mg | SUBLINGUAL_TABLET | SUBLINGUAL | 2 refills | Status: DC | PRN
  Filled 2020-12-21: qty 25, 8d supply, fill #0

## 2020-12-21 MED ORDER — ATORVASTATIN CALCIUM 80 MG PO TABS
80.0000 mg | ORAL_TABLET | Freq: Every day | ORAL | 1 refills | Status: DC
  Filled 2020-12-21: qty 30, 30d supply, fill #0

## 2020-12-21 MED ORDER — TICAGRELOR 90 MG PO TABS
90.0000 mg | ORAL_TABLET | Freq: Two times a day (BID) | ORAL | 2 refills | Status: DC
  Filled 2020-12-21: qty 60, 30d supply, fill #0

## 2020-12-21 MED FILL — Verapamil HCl IV Soln 2.5 MG/ML: INTRAVENOUS | Qty: 2 | Status: AC

## 2020-12-21 MED FILL — Nitroglycerin IV Soln 100 MCG/ML in D5W: INTRA_ARTERIAL | Qty: 10 | Status: AC

## 2020-12-21 NOTE — TOC Transition Note (Signed)
Transition of Care [facility]) - CM/SW Discharge Note   Patient Details  Name: Nicholas Andersen MRN: [id number] Date of Birth: April 11, 1961  [facility]) CM/SW Contact:  Zenon Mayo, RN Phone Number: 12/21/2020, 1:19 PM   Clinical Narrative:    Patient is for dc today, NCM spoke with wife regarding the brilinta coupon.  NCM informed her the [facility] pharmacy filled the first 30 days free and he can use the 5.00 coupon for each month as long as his deductible has been met, if not his copay will only be 25.00. she states for his PCP , he goes to [facility] at [place]. He has a cardiologist follow up in 2 weeks with Dr. Tamala Julian.   Final next level of care: Home/Self Care Barriers to Discharge: No Barriers Identified   Patient Goals and CMS Choice     Choice offered to / list presented to : NA  Discharge Placement                       Discharge Plan and Services                  DME Agency: NA       HH Arranged: NA          Social Determinants of Health (SDOH) Interventions     Readmission Risk Interventions No flowsheet data found.

## 2020-12-21 NOTE — Progress Notes (Signed)
CARDIAC REHAB PHASE I   PRE:  Rate/Rhythm: 59 SB  BP:  Sitting: 158/96      SaO2: 97 RA  MODE:  Ambulation: 770 ft   POST:  Rate/Rhythm: 84 SR w/ PVCs  BP:  Sitting: 172/104  Pt tolerated exercise well and amb 770 ft independently. Pt had no rest breaks or C/O of C/P, SOB, or dizziness. Education given to pt and wife who was on the phone. Discussed risk factors, exercise guidelines, NTG use, stent, restrictions, nutrition. Gave pt MI book and went over it. Pt and wife demonstrated verbal understanding of education. Talked about CRPHII. Will refer pt to [facility].  [id number]   Joya San, MS, ACM-CEP 12/21/2020 8:49 AM

## 2020-12-21 NOTE — Discharge Summary (Signed)
Discharge Summary    Patient ID: Nicholas Andersen MRN: [id number]; DOB: 06-03-1961  Admit date: 12/18/2020 Discharge date: 12/21/2020  PCP:  System, Provider Not In   [facility] Providers Cardiologist:  Dina Rich, MD     Discharge Diagnoses    Principal Problem:   Coronary artery disease involving native coronary artery of native heart with unstable angina pectoris [facility]) Active Problems:   NSTEMI (non-ST elevated myocardial infarction) (HCC)   Precordial chest pain   Hyperlipidemia   Hypertension  Diagnostic Studies/Procedures    Cath: 12/19/20  Total occlusion of distal circumflex, possibly culprit for ACS/MI. Significant left coronary calcification. Proximal LAD bifurcation stenosis with Medina 110 heavily calcified bifurcation stenosis of 80%.  60% more distal in the mid LAD.  The diagonal at the proximal bifurcation Right coronary is dominant and there is mid eccentric 40 to 50% narrowing. Normal LVEDP LVEF by echo was normal   RECOMMENDATIONS:   Technically difficult procedure from right radial approach due to angulation at the subclavian/innominate artery and innominate aortic bifurcation. Start Brilinta. Orbital atherectomy LAD with planned provisional stenting.  Diagnostic Dominance: Right    Cath: 12/20/20  85% eccentric proximal calcified LAD Medina 1, 1, 1 bifurcation stenosis with a large diagonal was treated with orbital atherectomy after intravascular ultrasound interrogation. Following atherectomy a 4.0 x 28 Synergy stent was placed and postdilated to 5 mm in diameter with provisional bifurcation treatment plan.  Post stenting there was 0% stenosis, TIMI grade III flow, and 40 to 50% ostial narrowing of the diagonal which required no specific treatment.  There was TIMI-3 flow in the diagonal as well.     RECOMMENDATIONS:   Chest discomfort that occurred during the procedure was different than the presenting symptoms.  Perhaps this  presentation was due to occlusion of the distal circumflex. Aggressive risk factor modification going forward. Sheath removal per protocol when ACT less than 175 seconds.  Diagnostic Dominance: Right    Intervention    Echo: 12/18/20  IMPRESSIONS     1. Left ventricular ejection fraction, by estimation, is 50 to 55%. The  left ventricle has normal function. Left ventricular endocardial border  not optimally defined to evaluate regional wall motion. Left ventricular  diastolic parameters are  indeterminate.   2. Right ventricular systolic function is normal. The right ventricular  size is normal. Tricuspid regurgitation signal is inadequate for assessing  PA pressure.   3. The mitral valve is normal in structure. No evidence of mitral valve  regurgitation. No evidence of mitral stenosis.   4. The aortic valve is tricuspid. There is mild calcification of the  aortic valve. There is mild thickening of the aortic valve. Aortic valve  regurgitation is not visualized. No aortic stenosis is present.   5. The inferior vena cava is normal in size with greater than 50%  respiratory variability, suggesting right atrial pressure of 3 mmHg.  _____________   History of Present Illness     Nicholas Andersen is a 60 y.o. male with history of hyperlipidemia, HTN, former tobacco, family history of MI in several paternal uncles who presented with chest pain   Symptoms started around 3AM the morning of admission. Initially burning like pain midchest, felt clammy. He thought it was heart burn, took some tums and symptoms eased off over time, went back to sleep. Up again few hours later with same symptoms, took a protonix and waited some time and symptoms resolved, went back to bed. Woke up, went to breakfast with friends,  Big breakfast with eggs/ham etc. Afterwards started having frequent belching. Later had new onset of a 5/10 chest tightness with feeling very clammy, numbness radiating down his right  arm, some associated SOB.      K 3.3 Cr 0.90 BUN 18 Lipase 51 WBC 7.6 Hgb 16.9 Plt 142 Trop 31-->234.   He was evaluated by Dr. Wyline Mood at [facility] and transferred to [facility] for further management with cardiac cath.   Hospital Course     NSTEMI: hsTn peaked at 9116. Placed on IV heparin and [facility] for further work up with cardiac cath. Echo noted EF of 50-55% unable to determine WMA. Underwent cardiac cath noted above with total occlusion of dLcx (likely culprit) with pLAD 80% calcified lesion, RCA with 40-50%. Difficult access from right radial 2/2 angulation of subclavian. Underwent successful orbital atherectomy with balloon angioplasty and stenting to LAD. No complications noted overnight post cath.  -- plan for DAPT with ASA/Brilinta for at least one year, along with statin, ARB, spiro and amlodipine   HLD: LDL 92 -- on atorvastatin 40mg , increased to 80mg  -- will need FLP/LFTs in 8 weeks   PVCs: supplemented K+ -- bradycardic, unable to add BB therapy at present   Hypokalemia: resolved    OSA: reports using Cpap in the past but has not been complaint over the past several years. -- will need referral for outpatient sleep study   HTN: on combo amlodipine/benazepril PTA. Reports blood pressures have not been well controlled at home.  -- added spiro 25mg  daily -- continued on amlodipine 10mg  with lisinopril increased to 40mg  daily  General: Well developed, well nourished, male appearing in no acute distress. Head: Normocephalic, atraumatic.  Neck: Supple without bruits, JVD. Lungs:  Resp regular and unlabored, CTA. Heart: RRR, S1, S2, no S3, S4, or murmur; no rub. Abdomen: Soft, non-tender, non-distended with normoactive bowel sounds. No hepatomegaly. No rebound/guarding. No obvious abdominal masses. Extremities: No clubbing, cyanosis, edema. Distal pedal pulses are 2+ bilaterally. Right radial cath site stable without bruising or hematoma Neuro: Alert and  oriented X 3. Moves all extremities spontaneously. Psych: Normal affect.  Patient was seen by Dr. and deemed stable for discharge home. Follow up in the office has been arranged. Medications sent to the [facility] pharmacy. Educated by PharmD prior to discharge.   Did the patient have an acute coronary syndrome (MI, NSTEMI, STEMI, etc) this admission?:  Yes                               AHA/ACC Clinical Performance & Quality Measures: Aspirin prescribed? - Yes ADP Receptor Inhibitor (Plavix/Clopidogrel, Brilinta/Ticagrelor or Effient/Prasugrel) prescribed (includes medically managed patients)? - Yes Beta Blocker prescribed? - No - bradycardia High Intensity Statin (Lipitor 40-80mg  or Crestor 20-40mg ) prescribed? - Yes EF assessed during THIS hospitalization? - Yes For EF <40%, was ACEI/ARB prescribed? - Not Applicable (EF >/= 40%) For EF <40%, Aldosterone Antagonist (Spironolactone or Eplerenone) prescribed? - Not Applicable (EF >/= 40%) Cardiac Rehab Phase II ordered (including medically managed patients)? - Yes      _____________  Discharge Vitals Blood pressure 137/75, pulse (!) 57, temperature 98.5 F (36.9 C), temperature source Oral, resp. rate 16, height 5\' 11"  (1.803 m), weight 122.5 kg, SpO2 94 %.  Filed Weights   12/18/20 2240 12/20/20 0446 12/21/20 0500  Weight: 119.9 kg 118.9 kg 122.5 kg    Labs & Radiologic Studies  CBC Recent Labs    12/18/20 0950 12/19/20 0017 12/20/20 0729 12/21/20 0047  WBC 7.6   < > 6.5 9.0  NEUTROABS 4.5  --   --   --   HGB 16.9   < > 14.9 15.6  HCT 49.0   < > 44.0 45.5  MCV 86.3   < > 86.8 86.5  PLT 142*   < > 119* 137*   < > = values in this interval not displayed.   Basic Metabolic Panel Recent Labs    16/10/96 1524 12/19/20 0017 12/20/20 0729 12/21/20 0047  NA  --    < > 138 136  K  --    < > 3.7 3.8  CL  --    < > 108 102  CO2  --    < > 26 26  GLUCOSE  --    < > 91 88  BUN  --    < > 12 12  CREATININE  --    < >  0.91 1.10  CALCIUM  --    < > 8.6* 9.2  MG 2.5*  --   --   --    < > = values in this interval not displayed.   Liver Function Tests Recent Labs    12/18/20 0950  AST 31  ALT 29  ALKPHOS 102  BILITOT 0.7  PROT 8.2*  ALBUMIN 4.5   Recent Labs    12/18/20 0950  LIPASE 51   High Sensitivity Troponin:   Recent Labs  Lab 12/18/20 0950 12/18/20 1345 12/18/20 1524 12/18/20 2237 12/19/20 0017  TROPONINIHS 31* 234* 601* 7,987* 9,116*    BNP Invalid input(s): POCBNP D-Dimer No results for input(s): DDIMER in the last 72 hours. Hemoglobin A1C Recent Labs    12/19/20 0017  HGBA1C 5.5   Fasting Lipid Panel Recent Labs    12/19/20 0017  CHOL 161  HDL 47  LDLCALC 92  TRIG 110  CHOLHDL 3.4   Thyroid Function Tests No results for input(s): TSH, T4TOTAL, T3FREE, THYROIDAB in the last 72 hours.  Invalid input(s): FREET3 _____________  CT Head Wo Contrast  Result Date: 12/18/2020 CLINICAL DATA:  Bilateral arm tingling. EXAM: CT HEAD WITHOUT CONTRAST TECHNIQUE: Contiguous axial images were obtained from the base of the skull through the vertex without intravenous contrast. COMPARISON:  None. FINDINGS: Brain: No evidence of acute infarction, hemorrhage, hydrocephalus, extra-axial collection or mass lesion/mass effect. Mild generalized cerebral atrophy. Vascular: Atherosclerotic vascular calcification of the carotid siphons. No hyperdense vessel. Skull: Normal. Negative for fracture or focal lesion. Sinuses/Orbits: Extensive bilateral paranasal sinus disease. The orbits are unremarkable. Other: None. IMPRESSION: 1. No acute intracranial abnormality. 2. Extensive bilateral paranasal sinus disease. Electronically Signed   By: Obie Dredge M.D.   On: 12/18/2020 12:14   CARDIAC CATHETERIZATION  Result Date: 12/20/2020  85% eccentric proximal calcified LAD Medina 1, 1, 1 bifurcation stenosis with a large diagonal was treated with orbital atherectomy after intravascular  ultrasound interrogation.  Following atherectomy a 4.0 x 28 Synergy stent was placed and postdilated to 5 mm in diameter with provisional bifurcation treatment plan.  Post stenting there was 0% stenosis, TIMI grade III flow, and 40 to 50% ostial narrowing of the diagonal which required no specific treatment.  There was TIMI-3 flow in the diagonal as well. RECOMMENDATIONS:  Chest discomfort that occurred during the procedure was different than the presenting symptoms.  Perhaps this presentation was due to occlusion of the distal circumflex.  Aggressive  risk factor modification going forward.  Sheath removal per protocol when ACT less than 175 seconds.  CARDIAC CATHETERIZATION  Addendum Date: 12/19/2020    Total occlusion of distal circumflex, possibly culprit for ACS/MI.  Significant left coronary calcification.  Proximal LAD bifurcation stenosis with Medina 110 heavily calcified bifurcation stenosis of 80%.  60% more distal in the mid LAD.  The diagonal at the proximal bifurcation  Right coronary is dominant and there is mid eccentric 40 to 50% narrowing.  Normal LVEDP  LVEF by echo was normal RECOMMENDATIONS:  Technically difficult procedure from right radial approach due to angulation at the subclavian/innominate artery and innominate aortic bifurcation.  Start Brilinta.  Orbital atherectomy LAD with planned provisional stenting.  Result Date: 12/19/2020  LV end diastolic pressure is normal.    DG Chest Portable 1 View  Result Date: 12/18/2020 CLINICAL DATA:  Chest pain, pain in center of chest with bilateral arm tingling. EXAM: PORTABLE CHEST 1 VIEW COMPARISON:  None FINDINGS: EKG leads project over the chest. Trachea is midline. Cardiomediastinal contours and hilar structures are normal. Lungs are clear.  No sign of effusion or pneumothorax. On limited assessment there is no acute skeletal process. IMPRESSION: No acute cardiopulmonary disease. Electronically Signed   By: Donzetta Kohut  M.D.   On: 12/18/2020 10:08   ECHOCARDIOGRAM COMPLETE  Result Date: 12/18/2020    ECHOCARDIOGRAM REPORT   Patient Name:   Nicholas Andersen Date of Exam: 12/18/2020 Medical Rec #:  [id number]      Height:       71.0 in Accession #:    [id number]     Weight:       270.0 lb Date of Birth:  1960-12-30     BSA:          2.395 m Patient Age:    59 years       BP:           177/99 mmHg Patient Gender: M              HR:           51 bpm. [facility] Procedure: 2D Echo, Cardiac Doppler and Color Doppler Indications:    Chest Pain  History:        Patient has no prior history of Echocardiogram examinations.                 Signs/Symptoms:Chest Pain.  Sonographer:    Mikki Harbor Referring Phys: [id number] Dorothe Pea BRANCH IMPRESSIONS  1. Left ventricular ejection fraction, by estimation, is 50 to 55%. The left ventricle has normal function. Left ventricular endocardial border not optimally defined to evaluate regional wall motion. Left ventricular diastolic parameters are indeterminate.  2. Right ventricular systolic function is normal. The right ventricular size is normal. Tricuspid regurgitation signal is inadequate for assessing PA pressure.  3. The mitral valve is normal in structure. No evidence of mitral valve regurgitation. No evidence of mitral stenosis.  4. The aortic valve is tricuspid. There is mild calcification of the aortic valve. There is mild thickening of the aortic valve. Aortic valve regurgitation is not visualized. No aortic stenosis is present.  5. The inferior vena cava is normal in size with greater than 50% respiratory variability, suggesting right atrial pressure of 3 mmHg. FINDINGS  Left Ventricle: Left ventricular ejection fraction, by estimation, is 50 to 55%. The left ventricle has normal function. Left ventricular endocardial border not optimally defined to evaluate regional wall motion. The  left ventricular internal cavity size was normal in size. There is no left ventricular  hypertrophy. Left ventricular diastolic parameters are indeterminate. Right Ventricle: The right ventricular size is normal. No increase in right ventricular wall thickness. Right ventricular systolic function is normal. Tricuspid regurgitation signal is inadequate for assessing PA pressure. Left Atrium: Left atrial size was normal in size. Right Atrium: Right atrial size was normal in size. Pericardium: There is no evidence of pericardial effusion. Mitral Valve: The mitral valve is normal in structure. No evidence of mitral valve regurgitation. No evidence of mitral valve stenosis. MV peak gradient, 5.1 mmHg. The mean mitral valve gradient is 1.0 mmHg. Tricuspid Valve: The tricuspid valve is normal in structure. Tricuspid valve regurgitation is not demonstrated. No evidence of tricuspid stenosis. Aortic Valve: The aortic valve is tricuspid. There is mild calcification of the aortic valve. There is mild thickening of the aortic valve. There is mild aortic valve annular calcification. Aortic valve regurgitation is not visualized. No aortic stenosis  is present. Aortic valve mean gradient measures 8.5 mmHg. Aortic valve peak gradient measures 17.7 mmHg. Aortic valve area, by VTI measures 2.15 cm. Pulmonic Valve: The pulmonic valve was not well visualized. Pulmonic valve regurgitation is not visualized. No evidence of pulmonic stenosis. Aorta: The aortic root is normal in size and structure. Venous: The inferior vena cava is normal in size with greater than 50% respiratory variability, suggesting right atrial pressure of 3 mmHg. IAS/Shunts: No atrial level shunt detected by color flow Doppler.  LEFT VENTRICLE PLAX 2D LVIDd:         4.68 cm  Diastology LVIDs:         3.40 cm  LV e' medial:    6.40 cm/s LV PW:         1.02 cm  LV E/e' medial:  15.5 LV IVS:        1.00 cm  LV e' lateral:   11.60 cm/s LVOT diam:     2.00 cm  LV E/e' lateral: 8.5 LV SV:         104 LV SV Index:   44 LVOT Area:     3.14 cm  RIGHT VENTRICLE  RV Basal diam:  4.12 cm RV Mid diam:    3.51 cm RV S prime:     11.50 cm/s TAPSE (M-mode): 3.1 cm LEFT ATRIUM             Index       RIGHT ATRIUM           Index LA diam:        4.30 cm 1.80 cm/m  RA Area:     14.90 cm LA Vol (A2C):   78.9 ml 32.94 ml/m RA Volume:   36.90 ml  15.40 ml/m LA Vol (A4C):   68.8 ml 28.72 ml/m LA Biplane Vol: 79.4 ml 33.15 ml/m  AORTIC VALVE AV Area (Vmax):    2.18 cm AV Area (Vmean):   2.10 cm AV Area (VTI):     2.15 cm AV Vmax:           210.50 cm/s AV Vmean:          134.500 cm/s AV VTI:            0.486 m AV Peak Grad:      17.7 mmHg AV Mean Grad:      8.5 mmHg LVOT Vmax:         146.00 cm/s LVOT Vmean:  90.000 cm/s LVOT VTI:          0.332 m LVOT/AV VTI ratio: 0.68  AORTA Ao Root diam: 3.10 cm Ao Asc diam:  3.60 cm MITRAL VALVE MV Area (PHT): 3.33 cm    SHUNTS MV Area VTI:   2.37 cm    Systemic VTI:  0.33 m MV Peak grad:  5.1 mmHg    Systemic Diam: 2.00 cm MV Mean grad:  1.0 mmHg MV Vmax:       1.13 m/s MV Vmean:      52.5 cm/s MV Decel Time: 228 msec MV E velocity: 99.00 cm/s MV A velocity: 91.30 cm/s MV E/A ratio:  1.08 Dina Rich MD Electronically signed by Dina Rich MD Signature Date/Time: 12/18/2020/5:12:30 PM    Final    CT Angio Chest/Abd/Pel for Dissection W and/or Wo Contrast  Result Date: 12/18/2020 CLINICAL DATA:  Acute onset central chest and right shoulder blade pain. EXAM: CT ANGIOGRAPHY CHEST, ABDOMEN AND PELVIS TECHNIQUE: Non-contrast CT of the chest was initially obtained. Multidetector CT imaging through the chest, abdomen and pelvis was performed using the standard protocol during bolus administration of intravenous contrast. Multiplanar reconstructed images and MIPs were obtained and reviewed to evaluate the vascular anatomy. CONTRAST:  OMNIPAQUE IOHEXOL 350 MG/ML SOLN COMPARISON:  Chest x-ray from same day. FINDINGS: CTA CHEST FINDINGS Cardiovascular: Preferential opacification of the thoracic aorta. No evidence of thoracic  aortic aneurysm or dissection. Normal heart size. Three-vessel coronary artery calcification. No pericardial effusion. No central pulmonary embolism. Mediastinum/Nodes: No enlarged mediastinal, hilar, or axillary lymph nodes. Thyroid gland, trachea, and esophagus demonstrate no significant findings. Lungs/Pleura: Lungs are clear. No pleural effusion or pneumothorax. 4 mm subpleural nodule in the superior segment of the left lower lobe (series 9, image 74). Musculoskeletal: No chest wall abnormality. No acute or significant osseous findings. Review of the MIP images confirms the above findings. CTA ABDOMEN AND PELVIS FINDINGS VASCULAR Aorta: Normal caliber aorta without aneurysm, dissection, vasculitis or significant stenosis. Mild atherosclerotic calcification. Celiac: Patent. 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 SMA: Patent without evidence of aneurysm, dissection, vasculitis or significant stenosis. Renals: Both renal arteries are patent without evidence of aneurysm, dissection, vasculitis, fibromuscular dysplasia or significant stenosis. IMA: Patent without evidence of aneurysm, dissection, vasculitis or significant stenosis. Inflow: Patent without evidence of aneurysm, dissection, vasculitis or significant stenosis. Veins: No obvious venous abnormality within the limitations of this arterial phase study. Review of the MIP images confirms the above findings. NON-VASCULAR Hepatobiliary: Diffusely decreased liver density without focal abnormality. The gallbladder is unremarkable. No biliary dilatation. Pancreas: Unremarkable. No pancreatic ductal dilatation or surrounding inflammatory changes. Spleen: Normal in size without focal abnormality. Adrenals/Urinary Tract: The adrenal glands are unremarkable. 1.2 cm simple cyst in the central midpole of the right kidney. No renal calculi or hydronephrosis. Mild circumferential bladder wall thickening is  likely related to under distension. Stomach/Bowel: Stomach is within normal limits. Appendix appears normal. No evidence of bowel wall thickening, distention, or inflammatory changes. Few scattered colonic diverticula. Lymphatic: No enlarged abdominal or pelvic lymph nodes. Reproductive: Prostate is unremarkable. Other: No abdominal wall hernia or abnormality. No abdominopelvic ascites. No pneumoperitoneum. Musculoskeletal: No acute or significant osseous findings. Review of the MIP images confirms the above findings. IMPRESSION: Vascular: 1. No evidence of aortic dissection or aneurysm. 2. 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 3. Aortic Atherosclerosis (ICD10-I70.0).  Chest: 1.  No acute intrathoracic process. 2. 4 mm subpleural nodule in the left lower lobe. No follow-up needed if patient is low-risk. Non-contrast chest CT can be considered in 12 months if patient is high-risk. This recommendation follows the consensus statement: Guidelines for Management of Incidental Pulmonary Nodules Detected on CT Images: From the Fleischner Society 2017; Radiology 2017; 284:228-243. Abdomen pelvis: 1.  No acute intra-abdominal process. 2. Hepatic steatosis. Electronically Signed   By: Obie DredgeWilliam T Derry M.D.   On: 12/18/2020 12:27   Disposition   Pt is being discharged home today in good condition.  Follow-up Plans & Appointments     Follow-up Information     Dyann KiefLenze, Michele M, PA-C Follow up on 01/16/2021.   Specialty: Cardiology Why: at 1pm for your follow up appt Contact information: [address] [place] [address] [phone]                Discharge Instructions     Call MD for:  redness, tenderness, or signs of infection (pain, swelling, redness, odor or green/yellow discharge around incision site)   Complete by: As directed    Diet - low sodium heart healthy   Complete by: As directed    Discharge  instructions   Complete by: As directed    Radial Site Care Refer to this sheet in the next few weeks. These instructions provide you with information on caring for yourself after your procedure. Your caregiver may also give you more specific instructions. Your treatment has been planned according to current medical practices, but problems sometimes occur. Call your caregiver if you have any problems or questions after your procedure. HOME CARE INSTRUCTIONS You may shower the day after the procedure. Remove the bandage (dressing) and gently wash the site with plain soap and water. Gently pat the site dry.  Do not apply powder or lotion to the site.  Do not submerge the affected site in water for 3 to 5 days.  Inspect the site at least twice daily.  Do not flex or bend the affected arm for 24 hours.  No lifting over 5 pounds (2.3 kg) for 5 days after your procedure.  Do not drive home if you are discharged the same day of the procedure. Have someone else drive you.  You may drive 24 hours after the procedure unless otherwise instructed by your caregiver.  What to expect: Any bruising will usually fade within 1 to 2 weeks.  Blood that collects in the tissue (hematoma) may be painful to the touch. It should usually decrease in size and tenderness within 1 to 2 weeks.  SEEK IMMEDIATE MEDICAL CARE IF: You have unusual pain at the radial site.  You have redness, warmth, swelling, or pain at the radial site.  You have drainage (other than a small amount of blood on the dressing).  You have chills.  You have a fever or persistent symptoms for more than 72 hours.  You have a fever and your symptoms suddenly get worse.  Your arm becomes pale, cool, tingly, or numb.  You have heavy bleeding from the site. Hold pressure on the site.   PLEASE DO NOT MISS ANY DOSES OF YOUR BRILINTA!!!!! Also keep a log of you blood pressures and bring back to your follow up appt. Please call the office with any  questions.   Patients taking blood thinners should generally stay away from medicines like ibuprofen, Advil, Motrin, naproxen, and Aleve due to risk of stomach bleeding. You may  take Tylenol as directed or talk to your primary doctor about alternatives.  PLEASE ENSURE THAT YOU DO NOT RUN OUT OF YOUR BRILINTA. This medication is very important to remain on for at least one year. IF you have issues obtaining this medication due to cost please CALL the office 3-5 business days prior to running out in order to prevent missing doses of this medication.   Increase activity slowly   Complete by: As directed        Discharge Medications   Allergies as of 12/21/2020       Reactions   Penicillins    Mouth swells and gets short of breath        Medication List     STOP taking these medications    amLODipine-benazepril 10-20 MG capsule Commonly known as: LOTREL   baclofen 10 MG tablet Commonly known as: LIORESAL       TAKE these medications    Advair Diskus 100-50 MCG/ACT Aepb Generic drug: fluticasone-salmeterol Inhale 1 puff into the lungs 2 (two) times daily.   allopurinol 300 MG tablet Commonly known as: ZYLOPRIM Take 300 mg by mouth daily.   amLODipine 10 MG tablet Commonly known as: NORVASC Take 1 tablet (10 mg total) by mouth daily.   aspirin EC 81 MG tablet Take 81 mg by mouth daily. Swallow whole.   atorvastatin 80 MG tablet Commonly known as: LIPITOR Take 1 tablet (80 mg total) by mouth daily at 6 PM. What changed:  medication strength how much to take when to take this   benazepril 40 MG tablet Commonly known as: LOTENSIN Take 1 tablet (40 mg total) by mouth daily.   calcium carbonate 500 MG chewable tablet Commonly known as: TUMS - dosed in mg elemental calcium Chew 2 tablets by mouth daily.   multivitamin with minerals Tabs tablet Take 1 tablet by mouth daily.   nitroGLYCERIN 0.4 MG SL tablet Commonly known as: NITROSTAT Place 1 tablet (0.4  mg total) under the tongue every 5 (five) minutes x 3 doses as needed for chest pain.   spironolactone 25 MG tablet Commonly known as: ALDACTONE Take 1 tablet (25 mg total) by mouth daily.   ticagrelor 90 MG Tabs tablet Commonly known as: BRILINTA Take 1 tablet (90 mg total) by mouth 2 (two) times daily.          Outstanding Labs/Studies   FLP/LFTs in 8 weeks. BMET at follow up  Duration of Discharge Encounter   Greater than 30 minutes including physician time.  Signed, Laverda Page, NP 12/21/2020, 8:43 AM

## 2020-12-25 ENCOUNTER — Telehealth (HOSPITAL_COMMUNITY): Payer: Self-pay

## 2020-12-25 NOTE — Telephone Encounter (Signed)
Per phase I cardiac rehab, fax cardiac rehab referral to [place] [facility]. 

## 2020-12-28 ENCOUNTER — Telehealth (HOSPITAL_COMMUNITY): Payer: Self-pay | Admitting: Pharmacist

## 2020-12-28 NOTE — Telephone Encounter (Signed)
Pharmacy Transitions of Care Follow-up Telephone Call  Date of discharge: 12/21/2020  Discharge Diagnosis: Stent, heart failure  How have you been since you were released from the hospital? Good   Medication changes made at discharge:  - START: Brillinta, amlodipine, benazapril, nitroglycerine, spironolactone  - STOPPED: baclofen, amlodipine/benazapril  - CHANGED: atorvastatin  Medication changes verified by the patient? yes (Yes/No)    Medication Accessibility:  Home Pharmacy: [facility]   Was the patient provided with refills on discharged medications? Yes Have all prescriptions been transferred from [facility] to home pharmacy? Yes   Is the patient able to afford medications? Yes    Medication Review: TICAGRELOR (BRILINTA) Ticagrelor 90 mg BID initiated on 12/21/20.  - Educated patient on expected duration of therapy of aspirin with ticagrelor. Advised patient that dose of ticagrelor will be reduced after . Aspirin will be continued indefinitely/discontinued. - Discussed importance of taking medication around the same time every day, - Reviewed potential DDIs with patient - Advised patient of medications to avoid (NSAIDs, aspirin maintenance doses>100 mg daily) - Educated that Tylenol (acetaminophen) will be the preferred analgesic to prevent risk of bleeding  - Emphasized importance of monitoring for signs and symptoms of bleeding (abnormal bruising, prolonged bleeding, nose bleeds, bleeding from gums, discolored urine, black tarry stools)  - Educated patient to notify doctor if shortness of breath or abnormal heartbeat occur - Advised patient to alert all providers of antiplatelet therapy prior to starting a new medication or having a procedure    Follow-up Appointments:  PCP Hospital f/u appt confirmed? Yes  Specialist Hospital f/u appt confirmed? Yes   If their condition worsens, is the pt aware to call PCP or go to the Emergency Dept.? Yes  Final Patient Assessment: Spoke  with patient, he reports he is doing well but still tired.  Reviewed medications.

## 2021-01-09 ENCOUNTER — Telehealth: Payer: Self-pay | Admitting: Cardiology

## 2021-01-09 NOTE — Telephone Encounter (Signed)
Per phone call from pt's wife- [name].. would like to know what is safe for pt to take for his allergies   Also, Pt had these medications changed while he was in the hospital - he's been feeling really washed out/ dizzy and his BP has been running really low 91/61 was his reading yesterday evening- had to take several times to get a reading- this morning before he took his medication it was 139/87  benazepril (LOTENSIN) 40 MG tablet [[id number]] amLODipine (NORVASC) 10 MG tablet [[id number]]    Please call [phone]

## 2021-01-09 NOTE — Progress Notes (Signed)
Cardiology Office Note    Date:  01/16/2021   ID:  Nicholas Andersen, DOB [birth year], MRN [id number]   PCP:  Normajean Glasgow, FNP    [facility]  Cardiologist:  Dina Rich, MD   Advanced Practice Provider:  No care team member to display Electrophysiologist:  None   [phone]   No chief complaint on file.   History of Present Illness:  Nicholas Andersen is a 60 y.o. male with history of hypertension, HLD, family history of CAD, former smoker.  Patient was admitted to [facility] with NSTEMI  12/19/20 underwent successful orbital atherectomy with balloon angioplasty and stenting to the LAD with residual total distal circumflex likely the culprit and RCA with 40 to 55% treated medically.  Plan for aspirin and Brilinta for 1 year along with statin and ARB spironolactone and amlodipine.  He had PVCs and bradycardia unable to add beta-blocker  Patient's wife called in 01/09/2021 because he was having some dizziness and low blood pressure. Dr. Diona Browner decreased benazepril 20 mg daily.   Patient comes in with wife. Feels washed out, no energy. Worried about everything. Using CPAP but needs someone to manage it. BP from home before meds 136/86, evenings 104/62. Was prescribed pyridostigm 60 mg for possible myasthenia gravis but hasn't started yet. 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 Needs a note to stay out.  Past Medical History:  Diagnosis Date   Asthma    Gout    Hypercholesterolemia    Hypertension     Past Surgical History:  Procedure Laterality Date   CORONARY ATHERECTOMY N/A 12/20/2020   Procedure: CORONARY ATHERECTOMY;  Surgeon: Lyn Records, MD;  Location: [facility] INVASIVE CV LAB;  Service: Cardiovascular;  Laterality: N/A;   INTRAVASCULAR ULTRASOUND/IVUS N/A 12/20/2020   Procedure: Intravascular Ultrasound/IVUS;  Surgeon: Lyn Records, MD;  Location: [facility] INVASIVE CV LAB;  Service: Cardiovascular;  Laterality: N/A;   LEFT HEART CATH AND  CORONARY ANGIOGRAPHY N/A 12/19/2020   Procedure: LEFT HEART CATH AND CORONARY ANGIOGRAPHY;  Surgeon: Lyn Records, MD;  Location: [facility] INVASIVE CV LAB;  Service: Cardiovascular;  Laterality: N/A;    Current Medications: Current Meds  Medication Sig   ADVAIR DISKUS 100-50 MCG/ACT AEPB Inhale 1 puff into the lungs 2 (two) times daily.   allopurinol (ZYLOPRIM) 300 MG tablet Take 300 mg by mouth daily.   amLODipine (NORVASC) 10 MG tablet Take 1 tablet (10 mg total) by mouth daily.   aspirin EC 81 MG tablet Take 81 mg by mouth daily. Swallow whole.   atorvastatin (LIPITOR) 80 MG tablet Take 1 tablet (80 mg total) by mouth daily at 6 PM.   benazepril (LOTENSIN) 20 MG tablet Take 1 tablet (20 mg total) by mouth daily.   calcium carbonate (TUMS - DOSED IN MG ELEMENTAL CALCIUM) 500 MG chewable tablet Chew 2 tablets by mouth daily.   Multiple Vitamin (MULTIVITAMIN WITH MINERALS) TABS tablet Take 1 tablet by mouth daily.   nitroGLYCERIN (NITROSTAT) 0.4 MG SL tablet Place 1 tablet (0.4 mg total) under the tongue every 5 (five) minutes x 3 doses as needed for chest pain.   spironolactone (ALDACTONE) 25 MG tablet Take 1 tablet (25 mg total) by mouth daily.   ticagrelor (BRILINTA) 90 MG TABS tablet Take 1 tablet (90 mg total) by mouth 2 (two) times daily.     Allergies:   Penicillins   Social History   Socioeconomic History   Marital status: Married  Spouse name: Not on file   Number of children: Not on file   Years of education: Not on file   Highest education level: Not on file  Occupational History   Not on file  Tobacco Use   Smoking status: Former    Packs/day: 1.00    Years: 15.00    Pack years: 15.00    Types: Cigarettes    Quit date: 73    Years since quitting: 32.6    Passive exposure: Never   Smokeless tobacco: Never  Vaping Use   Vaping Use: Never used  Substance and Sexual Activity   Alcohol use: Yes    Alcohol/week: 10.0 standard drinks    Types: 10 Cans of beer per  week    Comment: weekly   Drug use: Not Currently   Sexual activity: Yes    Birth control/protection: None  Other Topics Concern   Not on file  Social History Narrative   Not on file   Social Determinants of Health   Financial Resource Strain: Not on file  Food Insecurity: Not on file  Transportation Needs: Not on file  Physical Activity: Not on file  Stress: Not on file  Social Connections: Not on file     Family History:  The patient's  family history includes Diabetes in his brother.   ROS:   Please see the history of present illness.    ROS All other systems reviewed and are negative.   PHYSICAL EXAM:   VS:  BP 132/76   Pulse (!) 46   Ht 5\' 11"  (1.803 m)   Wt 264 lb (119.7 kg)   SpO2 97%   BMI 36.82 kg/m   Physical Exam  GEN: Obese, in no acute distress  Neck: no JVD, carotid bruits, or masses Cardiac:RRR; 2/6 systolic murmur at the left sternal border Respiratory:  clear to auscultation bilaterally, normal work of breathing GI: soft, nontender, nondistended, + BS Ext: Cath site of right radial and groin stable without hematoma or hemorrhage without cyanosis, clubbing, or edema, Good distal pulses bilaterally Neuro:  Alert and Oriented x 3 Psych: euthymic mood, full affect  Wt Readings from Last 3 Encounters:  01/16/21 264 lb (119.7 kg)  12/21/20 270 lb 1 oz (122.5 kg)      Studies/Labs Reviewed:   EKG:  EKG is not ordered today.     Recent Labs: 12/18/2020: ALT 29; Magnesium 2.5 12/21/2020: BUN 12; Creatinine, Ser 1.10; Hemoglobin 15.6; Platelets 137; Potassium 3.8; Sodium 136   Lipid Panel    Component Value Date/Time   CHOL 161 12/19/2020 0017   TRIG 110 12/19/2020 0017   HDL 47 12/19/2020 0017   CHOLHDL 3.4 12/19/2020 0017   VLDL 22 12/19/2020 0017   LDLCALC 92 12/19/2020 0017    Additional studies/ records that were reviewed today include:  Echo: 12/18/20  IMPRESSIONS     1. Left ventricular ejection fraction, by estimation, is 50 to  55%. The  left ventricle has normal function. Left ventricular endocardial border  not optimally defined to evaluate regional wall motion. Left ventricular  diastolic parameters are  indeterminate.   2. Right ventricular systolic function is normal. The right ventricular  size is normal. Tricuspid regurgitation signal is inadequate for assessing  PA pressure.   3. The mitral valve is normal in structure. No evidence of mitral valve  regurgitation. No evidence of mitral stenosis.   4. The aortic valve is tricuspid. There is mild calcification of the  aortic valve. There  is mild thickening of the aortic valve. Aortic valve  regurgitation is not visualized. No aortic stenosis is present.   5. The inferior vena cava is normal in size with greater than 50%  respiratory variability, suggesting right atrial pressure of 3 mmHg. _____________  Cath: 12/20/20   85% eccentric proximal calcified LAD Medina 1, 1, 1 bifurcation stenosis with a large diagonal was treated with orbital atherectomy after intravascular ultrasound interrogation. Following atherectomy a 4.0 x 28 Synergy stent was placed and postdilated to 5 mm in diameter with provisional bifurcation treatment plan.  Post stenting there was 0% stenosis, TIMI grade III flow, and 40 to 50% ostial narrowing of the diagonal which required no specific treatment.  There was TIMI-3 flow in the diagonal as well.     RECOMMENDATIONS:   Chest discomfort that occurred during the procedure was different than the presenting symptoms.  Perhaps this presentation was due to occlusion of the distal circumflex. Aggressive risk factor modification going forward. Sheath removal per protocol when ACT less than 175 seconds.    Cath: 12/19/20   Total occlusion of distal circumflex, possibly culprit for ACS/MI. Significant left coronary calcification. Proximal LAD bifurcation stenosis with Medina 110 heavily calcified bifurcation stenosis of 80%.  60% more  distal in the mid LAD.  The diagonal at the proximal bifurcation Right coronary is dominant and there is mid eccentric 40 to 50% narrowing. Normal LVEDP LVEF by echo was normal   RECOMMENDATIONS:   Technically difficult procedure from right radial approach due to angulation at the subclavian/innominate artery and innominate aortic bifurcation. Start Brilinta. Orbital atherectomy LAD with planned provisional stenting.   Risk Assessment/Calculations:         ASSESSMENT:    1. Coronary artery disease involving native coronary artery of native heart without angina pectoris   2. Ischemic cardiomyopathy   3. Essential hypertension   4. Bradycardia   5. PVC (premature ventricular contraction)   6. OSA (obstructive sleep apnea)   7. Hyperlipidemia, unspecified hyperlipidemia type   8. Morbid obesity (HCC)      PLAN:  In order of problems listed above: NSTEMI underwent successful orbital atherectomy with balloon angioplasty and stenting to the LAD with residual total distal circumflex likely the culprit and RCA with 40 to 55% treated medically.  Plan for aspirin and Brilinta for 1 year along with statin and ARB spironolactone and amlodipine.  He had PVCs and bradycardia unable to add beta-blocker.  Overall doing well without angina but has a lot of fatigue and anxiety around his diagnosis.  Going to do cardiac rehab in [place].  His job requires him to work in 120 degree heat so we will give him a note to stay out for now.   Ischemic cardiomyopathy EF 40- 55%.  Check echo in 3 months.  Hypertension blood pressure more stable on lower dose benazepril.  HLD on Lipitor check fasting lipid panel in 3 months.  OSA on CPAP but not managed by any body.  Referred to pulmonary  Obesity working on exercise and weight loss  Shared Decision Making/Informed Consent        Medication Adjustments/Labs and Tests Ordered: Current medicines are reviewed at length with the patient today.   Concerns regarding medicines are outlined above.  Medication changes, Labs and Tests ordered today are listed in the Patient Instructions below. Patient Instructions  Medication Instructions:  Your physician recommends that you continue on your current medications as directed. Please refer to the Current Medication list given  to you today.   *If you need a refill on your cardiac medications before your next appointment, please call your pharmacy*   Lab Work: Fasting Lipids in 3 months at [facility] outpatient lab  If you have labs (blood work) drawn today and your tests are completely normal, you will receive your results only by: MyChart Message (if you have MyChart) OR A paper copy[facility] Health in the mail If you have any lab test that is abnormal or we need to change your treatment, we will call you to review the results.   Testing/Procedures: Your physician has requested that you have an echocardiogram in 3 months. Echocardiography is a painless test that uses sound waves to create images of your heart. It provides your doctor with information about the size and shape of your heart and how well your heart's chambers and valves are working. This procedure takes approximately one hour. There are no restrictions for this procedure.    Follow-Up: At [facility], you and your health needs are our priority.  As part of our continuing mission to provide you with exceptional heart care, we have created designated Provider Care Teams.  These Care Teams include your primary Cardiologist (physician) and Advanced Practice Providers (APPs -  Physician Assistants and Nurse Practitioners) who all work together to provide you with the care you need, when you need it.  We recommend signing up for the patient portal called "MyChart".  Sign up information is provided on this After Visit Summary.  MyChart is used to connect with patients for Virtual Visits (Telemedicine).  Patients are able to view lab/test  results, encounter notes, upcoming appointments, etc.  Non-urgent messages can be sent to your provider as well.   To learn more about what you can do with MyChart, go to [email or web address].    Your next appointment:  Next available with Dr.Branch     Referral placed to [facility] to manage your CPAP. They will call you to schedule an appointment.     Signed, Jacolyn ReedyMichele Dorin Stooksbury, PA-C  01/16/2021 1:53 PM    [facility] [address], [place], [place]  [address] Phone: [phone](336) [phone]; Fax: [phone]) [phone]

## 2021-01-09 NOTE — Telephone Encounter (Signed)
I suggested wife speak with pharmacist to discuss options but in general Claritin and Mucinex without the "D" are safe. I told her on reading of 91/61 is fine as long as he doesn't trend down. I suggested she keep a BP log and take daily BP's at random times and call us back with recordings.

## 2021-01-10 ENCOUNTER — Telehealth: Payer: Self-pay | Admitting: Physician Assistant

## 2021-01-10 MED ORDER — BENAZEPRIL HCL 20 MG PO TABS: 20.0000 mg | ORAL_TABLET | Freq: Every day | ORAL | 11 refills | Status: DC

## 2021-01-10 NOTE — Telephone Encounter (Signed)
Patient called Sun(7/31) BP 104/62, Mon(8/1) BP 136/86 AM and 91/61 PM, Tues(8/2) BP 139/87 AM and 100/64 PM, today(8/3) BP  119/67 AM. He said it is  really low make him feel bad. He doesn't want to take his meds because of it. He was seen in ED and they had doubled his BP meds. Looks like Branch may have seen him in ED. He has f/u scheduled with Jacolyn Reedy. He would like a call back so he will know what Dr suggest he do.  [phone]

## 2021-01-10 NOTE — Telephone Encounter (Signed)
Spoke with pt who states that he was seen in the ED and his BP medication was increased. He now taking Norvasc 10 mg and Benazepril 40. Was taking Lotrel 10mg -20mg  prior to admission. Pt c/o being weak and feeling "washed out". Pt states that he did not take BP meds this morning. Recent BP are as follows 119/67 HR 59, 100/64 HR 60, 91/61. Pt has an appt with PA-C on 01/16/21. Please advise.

## 2021-01-10 NOTE — Telephone Encounter (Signed)
Pt notified and agrees with plan of care.  

## 2021-01-16 ENCOUNTER — Ambulatory Visit (INDEPENDENT_AMBULATORY_CARE_PROVIDER_SITE_OTHER): Payer: BC Managed Care – PPO | Admitting: Physician Assistant

## 2021-01-16 ENCOUNTER — Encounter: Payer: Self-pay | Admitting: Physician Assistant

## 2021-01-16 ENCOUNTER — Other Ambulatory Visit: Payer: Self-pay

## 2021-01-16 VITALS — BP 132/76 | HR 46 | Ht 71.0 in | Wt 264.0 lb

## 2021-01-16 DIAGNOSIS — I2510 Atherosclerotic heart disease of native coronary artery without angina pectoris: Secondary | ICD-10-CM

## 2021-01-16 DIAGNOSIS — G4733 Obstructive sleep apnea (adult) (pediatric): Secondary | ICD-10-CM

## 2021-01-16 DIAGNOSIS — I10 Essential (primary) hypertension: Secondary | ICD-10-CM

## 2021-01-16 DIAGNOSIS — I493 Ventricular premature depolarization: Secondary | ICD-10-CM

## 2021-01-16 DIAGNOSIS — I255 Ischemic cardiomyopathy: Secondary | ICD-10-CM | POA: Diagnosis not present

## 2021-01-16 DIAGNOSIS — R001 Bradycardia, unspecified: Secondary | ICD-10-CM

## 2021-01-16 DIAGNOSIS — E785 Hyperlipidemia, unspecified: Secondary | ICD-10-CM

## 2021-01-16 NOTE — Patient Instructions (Signed)
Medication Instructions:  Your physician recommends that you continue on your current medications as directed. Please refer to the Current Medication list given to you today.   *If you need a refill on your cardiac medications before your next appointment, please call your pharmacy*   Lab Work: Fasting Lipids in 3 months at [facility] outpatient lab  If you have labs (blood work) drawn today and your tests are completely normal, you will receive your results only by: MyChart Message (if you have MyChart) OR A paper copy in the mail If you have any lab test that is abnormal or we need to change your treatment, we will call you to review the results.   Testing/Procedures: Your physician has requested that you have an echocardiogram in 3 months. Echocardiography is a painless test that uses sound waves to create images of your heart. It provides your doctor with information about the size and shape of your heart and how well your heart's chambers and valves are working. This procedure takes approximately one hour. There are no restrictions for this procedure.    Follow-Up: At [facility], you and your health needs are our priority.  As part of our continuing mission to provide you with exceptional heart care, we have created designated Provider Care Teams.  These Care Teams include your primary Cardiologist (physician) and Advanced Practice Providers (APPs -  Physician Assistants and Nurse Practitioners) who all work together to provide you with the care you need, when you need it.  We recommend signing up for the patient portal called "MyChart".  Sign up information is provided on this After Visit Summary.  MyChart is used to connect with patients for Virtual Visits (Telemedicine).  Patients are able to view lab/test results, encounter notes, upcoming appointments, etc.  Non-urgent messages can be sent to your provider as well.   To learn more about what you can do with MyChart, go  to [email or web address].    Your next appointment:  Next available with Dr.Branch     Referral placed to [facility] to manage your CPAP. They will call you to schedule an appointment.

## 2021-01-17 ENCOUNTER — Telehealth: Payer: Self-pay | Admitting: Physician Assistant

## 2021-01-17 MED ORDER — SPIRONOLACTONE 25 MG PO TABS: 25.0000 mg | ORAL_TABLET | Freq: Every day | ORAL | 3 refills | Status: AC

## 2021-01-17 MED ORDER — TICAGRELOR 90 MG PO TABS: 90.0000 mg | ORAL_TABLET | Freq: Two times a day (BID) | ORAL | 2 refills | Status: DC

## 2021-01-17 NOTE — Telephone Encounter (Signed)
*  STAT* If patient is at the pharmacy, call can be transferred to refill team.   1. Which medications need to be refilled? (please list name of each medication and dose if known) Spironolactone 25mg  & Brilinta 90mg   2. Which pharmacy/location (including street and city if local pharmacy) is medication to be sent to? [facility] [phone]  3. Do they need a 30 day or 90 day supply?  90  Patient called said [facility] does not have his Rx. He said [facility] was going to send or either cont #[phone] where he had his heart cath last month.

## 2021-01-17 NOTE — Telephone Encounter (Signed)
Refill complete 

## 2021-01-23 ENCOUNTER — Telehealth: Payer: Self-pay

## 2021-01-23 NOTE — Telephone Encounter (Signed)
Forms from [facility]  received on 01/23/21. Completed patient auth attached. Took form to MD Box for completion. [facility] 01/23/21  $29.00 in cash received from patient and in the drawer.

## 2021-02-07 ENCOUNTER — Telehealth: Payer: Self-pay | Admitting: *Deleted

## 2021-02-07 ENCOUNTER — Telehealth: Payer: Self-pay

## 2021-02-07 MED ORDER — NITROGLYCERIN 0.4 MG SL SUBL: 0.4000 mg | SUBLINGUAL_TABLET | SUBLINGUAL | 2 refills | Status: AC | PRN

## 2021-02-07 NOTE — Telephone Encounter (Signed)
Pharmacist called to alert of drug interaction between nitro and sildenafil. 08/18 filled sildenafil 100 mg by Dr. Antony Haste Advised that patient would be notified not to take both at same time

## 2021-02-07 NOTE — Telephone Encounter (Signed)
Medication refill request approved for Nitroglycerin 0.4 SL tablets and sent to [facility] [place] in [place]

## 2021-02-07 NOTE — Telephone Encounter (Addendum)
Noted, pharmacist made aware that patient understands to not take NTG and sildenafil together.

## 2021-02-15 ENCOUNTER — Encounter (INDEPENDENT_AMBULATORY_CARE_PROVIDER_SITE_OTHER): Payer: Self-pay | Admitting: *Deleted

## 2021-03-02 ENCOUNTER — Encounter: Payer: Self-pay | Admitting: Pulmonary Disease

## 2021-03-02 ENCOUNTER — Ambulatory Visit (INDEPENDENT_AMBULATORY_CARE_PROVIDER_SITE_OTHER): Payer: BC Managed Care – PPO | Admitting: Pulmonary Disease

## 2021-03-02 ENCOUNTER — Other Ambulatory Visit: Payer: Self-pay

## 2021-03-02 VITALS — BP 128/76 | HR 81 | Temp 98.0°F | Ht 71.0 in | Wt 265.0 lb

## 2021-03-02 DIAGNOSIS — G4733 Obstructive sleep apnea (adult) (pediatric): Secondary | ICD-10-CM | POA: Diagnosis not present

## 2021-03-02 DIAGNOSIS — R911 Solitary pulmonary nodule: Secondary | ICD-10-CM

## 2021-03-02 DIAGNOSIS — J4530 Mild persistent asthma, uncomplicated: Secondary | ICD-10-CM

## 2021-03-02 NOTE — Patient Instructions (Signed)
Will arrange for new medical supply company in [place] and have then adjust your new CPAP machine  Will try to get copy of your medical records from Dr. Donnamarie Poag office  Follow up in [place] office in 6 months

## 2021-03-02 NOTE — Progress Notes (Signed)
[facility]  Chief Complaint  Patient presents with   Consult    Patient reports that he has had a CPAP since 2012. He is here for a sleep consult.      Constitutional:  BP 128/76 (BP Location: Left Arm, Patient Position: Sitting, Cuff Size: Large)   Pulse 81   Temp 98 F (36.7 C) (Oral)   Ht 5\' 11"  (1.803 m)   Wt 265 lb (120.2 kg)   SpO2 97%   BMI 36.96 kg/m   Past Medical History:  Asthma, Gout, HLD, HTN, CAD  Past Surgical History:  He  has a past surgical history that includes LEFT HEART CATH AND CORONARY ANGIOGRAPHY (N/A, 12/19/2020); CORONARY ATHERECTOMY (N/A, 12/20/2020); and Intravascular Ultrasound/IVUS (N/A, 12/20/2020).  Brief Summary:  Nicholas Andersen is a 60 y.o. male former smoker with obstructive sleep apnea, asthma, and lung nodule.      Subjective:   He was in hospital in July with an NSTEMI.  He had follow up with cardiology in August.  He has history of sleep apnea and has CPAP, but needed a provider to manage therapy.  He was seen previously by Dr. September with pulmonary in [place].  He had sleep study years ago.  He still has original machine.  His previous DME is no longer open.  He did get a new Resmed S10 from a friend who never used it.  He needs new mask and supplies.  She was told he has asthma.  He uses advair once per day.  He quit smoking years ago.  No recent PFT.  Doesn't need to use rescue inhaler.  He is unable to sleep without using CPAP.  He snores and wakes up feeling like he can't breath, and then feels tired during the day.  Physical Exam:   Appearance - well kempt   ENMT - no sinus tenderness, no oral exudate, no LAN, Mallampati 3 airway, no stridor  Respiratory - equal breath sounds bilaterally, no wheezing or rales  CV - s1s2 regular rate and rhythm, no murmurs  Ext - no clubbing, no edema  Skin - no rashes  Psych - normal mood and affect   Pulmonary testing:    Chest  Imaging:  CT angio chest 12/18/20 >> 4 mm nodule LLL  Sleep Tests:    Cardiac Tests:  Echo 12/18/20 >> EF 50 to 55%  Social History:  He  reports that he quit smoking about 32 years ago. His smoking use included cigarettes. He has a 15.00 pack-year smoking history. He has never been exposed to tobacco smoke. He has never used smokeless tobacco. He reports current alcohol use of about 10.0 standard drinks per week. He reports that he does not currently use drugs.  Family History:  His family history includes Diabetes in his brother.     Assessment/Plan:   Obstructive sleep apnea. - will arrange for him to get set up with DME in [place] - he will use the Resmed S10 device he received as a gift; will have this set to auto CPAP 5 to 20 cm H2O - will try to get copy of his previous sleep study results; explained he might need new sleep study for insurance coverage  Asthma. - continue advair - will get copy of his previous PFTs  Lung nodule with history of tobacco abuse. - will arrange for f/u CT chest without contrast in July 2023  Time Spent Involved in Patient Care on Day of  Examination:  48 minutes  Follow up:   Patient Instructions  Will arrange for new medical supply company in [place] and have then adjust your new CPAP machine  Will try to get copy of your medical records from Dr. Donnamarie Poag office  Follow up in [place] office in 6 months  Medication List:   Allergies as of 03/02/2021       Reactions   Penicillins    Mouth swells and gets short of breath        Medication List        Accurate as of March 02, 2021  5:23 PM. If you have any questions, ask your nurse or doctor.          STOP taking these medications    amLODipine 10 MG tablet Commonly known as: NORVASC Stopped by: Coralyn Helling, MD       TAKE these medications    Advair Diskus 100-50 MCG/ACT Aepb Generic drug: fluticasone-salmeterol Inhale 1 puff into the lungs 2 (two)  times daily.   allopurinol 300 MG tablet Commonly known as: ZYLOPRIM Take 300 mg by mouth daily.   amLODipine-benazepril 10-20 MG capsule Commonly known as: LOTREL Take 1 capsule by mouth daily.   aspirin EC 81 MG tablet Take 81 mg by mouth daily. Swallow whole.   atorvastatin 80 MG tablet Commonly known as: LIPITOR Take 1 tablet (80 mg total) by mouth daily at 6 PM.   benazepril 20 MG tablet Commonly known as: LOTENSIN Take 1 tablet (20 mg total) by mouth daily.   calcium carbonate 500 MG chewable tablet Commonly known as: TUMS - dosed in mg elemental calcium Chew 2 tablets by mouth daily.   multivitamin with minerals Tabs tablet Take 1 tablet by mouth daily.   nitroGLYCERIN 0.4 MG SL tablet Commonly known as: NITROSTAT Place 1 tablet (0.4 mg total) under the tongue every 5 (five) minutes x 3 doses as needed for chest pain.   pyridostigmine 60 MG tablet Commonly known as: MESTINON Take 2 tablets by mouth.   sildenafil 100 MG tablet Commonly known as: VIAGRA Take 100 mg by mouth as needed.   spironolactone 25 MG tablet Commonly known as: ALDACTONE Take 1 tablet (25 mg total) by mouth daily.   ticagrelor 90 MG Tabs tablet Commonly known as: BRILINTA Take 1 tablet (90 mg total) by mouth 2 (two) times daily.        Signature:  Coralyn Helling, MD [facility] Pulmonary/Critical Care Pager - [phone] 03/02/2021, 5:23 PM

## 2021-03-12 ENCOUNTER — Telehealth: Payer: Self-pay | Admitting: Pulmonary Disease

## 2021-03-14 ENCOUNTER — Telehealth: Payer: Self-pay | Admitting: Cardiology

## 2021-03-14 NOTE — Telephone Encounter (Signed)
Paperwork completed, return to work date as of now 04/25/2021. He is to complete cardiac rehab the week prior, follow progress as he has a very physically demanding job.   Dina Rich MD

## 2021-03-15 NOTE — Telephone Encounter (Signed)
Called and spoke with patient. I made him aware that we can not send any DME orders without a sleep study on file, even if it is just for supplies and to adjust the settings. He verbalized understanding. He stated that his last sleep study was done with Dr. Orson Aloe in [place] with [facility]. He does not remember signing a medical release the day of his visit.   I advised him that I would try to get the sleep study but he may need to either come back to our office to sign a release or sign a release at the sleep lab.  Will update once I hear back from the sleep lab [phone]).

## 2021-03-15 NOTE — Telephone Encounter (Signed)
We have not received the sleep study for this patient.  We are not able to send a DME order without the sleep study.

## 2021-03-15 NOTE — Telephone Encounter (Signed)
ATC x1, LM to return call.

## 2021-03-16 NOTE — Telephone Encounter (Signed)
Form completed and picked up by patient  on 03/14/21. NJM 03/16/21

## 2021-03-21 ENCOUNTER — Telehealth: Payer: Self-pay | Admitting: Cardiology

## 2021-03-21 NOTE — Telephone Encounter (Signed)
Forms from [employer] - weekly benefit forms  received on 03/21/21. Completed patient auth attached. Took form to MD Box for completion. NJM 10/12 gave to [facility]

## 2021-03-27 NOTE — Telephone Encounter (Signed)
Cherina, please advise if the sleep study was received. Thanks.

## 2021-03-29 NOTE — Telephone Encounter (Signed)
I looked through Dr. Evlyn Courier papers and did not see anything for this patient. I have called and left a VM for him. He may need to go MR at [facility] and get the sleep study himself since he did not sign any release forms the day of his visit.

## 2021-04-17 NOTE — Telephone Encounter (Signed)
Called and spoke to pt. Pt states he has already followed up with another sleep provider and no longer needs to follow up with Dr. Craige Cotta. Pt denied needing anything further. Will forward to Dr. Craige Cotta and Hendricks Limes as Lorain Childes.

## 2021-04-18 ENCOUNTER — Ambulatory Visit (HOSPITAL_COMMUNITY)
Admission: RE | Admit: 2021-04-18 | Discharge: 2021-04-18 | Disposition: A | Discharge status: Home / Self Care | Payer: BC Managed Care – PPO | Source: Ambulatory Visit | Attending: Physician Assistant | Admitting: Physician Assistant

## 2021-04-18 ENCOUNTER — Other Ambulatory Visit: Payer: Self-pay

## 2021-04-18 ENCOUNTER — Other Ambulatory Visit (HOSPITAL_COMMUNITY)
Admission: RE | Admit: 2021-04-18 | Discharge: 2021-04-18 | Disposition: A | Discharge status: Home / Self Care | Payer: BC Managed Care – PPO | Source: Ambulatory Visit | Attending: Physician Assistant | Admitting: Physician Assistant

## 2021-04-18 DIAGNOSIS — I255 Ischemic cardiomyopathy: Secondary | ICD-10-CM | POA: Insufficient documentation

## 2021-04-18 DIAGNOSIS — I2510 Atherosclerotic heart disease of native coronary artery without angina pectoris: Secondary | ICD-10-CM | POA: Insufficient documentation

## 2021-04-18 LAB — LIPID PANEL
Cholesterol: 126 mg/dL (ref 0–200)
HDL: 44 mg/dL (ref >40)
LDL Cholesterol: 70 mg/dL (ref 0–99)
Total CHOL/HDL Ratio: 2.9 RATIO
Triglycerides: 62 mg/dL (ref <150)
VLDL: 12 mg/dL (ref 0–40)

## 2021-04-18 LAB — ECHOCARDIOGRAM COMPLETE
AR max vel: 2.16 cm2
AV Area VTI: 1.98 cm2
AV Area mean vel: 1.98 cm2
AV Mean grad: 9 mmHg
AV Peak grad: 16.2 mmHg
Ao pk vel: 2.01 m/s
Area-P 1/2: 2.87 cm2
S' Lateral: 3.5 cm

## 2021-04-18 NOTE — Progress Notes (Signed)
*  PRELIMINARY RESULTS* Echocardiogram 2D Echocardiogram has been performed.  Nicholas Andersen 04/18/2021, 11:46 AM

## 2021-04-23 ENCOUNTER — Other Ambulatory Visit: Payer: Self-pay

## 2021-04-23 ENCOUNTER — Encounter: Payer: Self-pay | Admitting: Cardiology

## 2021-04-23 ENCOUNTER — Ambulatory Visit (INDEPENDENT_AMBULATORY_CARE_PROVIDER_SITE_OTHER): Payer: BC Managed Care – PPO | Admitting: Cardiology

## 2021-04-23 VITALS — BP 126/82 | HR 60 | Ht 71.0 in | Wt 266.0 lb

## 2021-04-23 DIAGNOSIS — I2510 Atherosclerotic heart disease of native coronary artery without angina pectoris: Secondary | ICD-10-CM | POA: Diagnosis not present

## 2021-04-23 DIAGNOSIS — E782 Mixed hyperlipidemia: Secondary | ICD-10-CM

## 2021-04-23 MED ORDER — CLOPIDOGREL BISULFATE 75 MG PO TABS: ORAL_TABLET | ORAL | 6 refills | Status: DC

## 2021-04-23 NOTE — Progress Notes (Signed)
Clinical Summary Mr. Highfill is a 60 y.o.male seen today for follow up of the following medical problems.   1.CAD/NSTEMI - admitted 12/2020 with NSTEMI - 12/2020 echo LVEF 50-55%, indet diastolic, normal rV - 12/2020 cath: LAD prox 85% and mid 50%, occldued mid LCX, prox RCA 60% - had orbtital atherectomy and DES to LAD - no beta blocker due to bradycardia - 04/2021 echo LVEF 60-65%, no WMAs    - in cardiac rehab completed 8 weeks - no specific exertional chest pains - feels fatigued with activities. No specifici chest pains or SOB, just "washed out" with activities.    2.OSA -on cpap,  followed Dr. Heide Spark neurology in [place]   3. 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 Works for    Past Medical History:  Diagnosis Date   Asthma    Coronary artery disease    Gout    Hypercholesterolemia    Hypertension      Allergies  Allergen Reactions   Penicillins     Mouth swells and gets short of breath     Current Outpatient Medications  Medication Sig Dispense Refill   ADVAIR DISKUS 100-50 MCG/ACT AEPB Inhale 1 puff into the lungs 2 (two) times daily.     allopurinol (ZYLOPRIM) 300 MG tablet Take 300 mg by mouth daily.     amLODipine-benazepril (LOTREL) 10-20 MG capsule Take 1 capsule by mouth daily.     aspirin EC 81 MG tablet Take 81 mg by mouth daily. Swallow whole.     atorvastatin (LIPITOR) 80 MG tablet Take 1 tablet (80 mg total) by mouth daily at 6 PM. 90 tablet 1   benazepril (LOTENSIN) 20 MG tablet Take 1 tablet (20 mg total) by mouth daily. 30 tablet 11   calcium carbonate (TUMS - DOSED IN MG ELEMENTAL CALCIUM) 500 MG chewable tablet Chew 2 tablets by mouth daily.     Multiple Vitamin (MULTIVITAMIN WITH MINERALS) TABS tablet Take 1 tablet by mouth daily.      nitroGLYCERIN (NITROSTAT) 0.4 MG SL tablet Place 1 tablet (0.4 mg total) under the tongue every 5 (five) minutes x 3 doses as needed for chest pain. 25 tablet 2   pyridostigmine (MESTINON) 60 MG tablet Take 2 tablets by mouth.     sildenafil (VIAGRA) 100 MG tablet Take 100 mg by mouth as needed.     spironolactone (ALDACTONE) 25 MG tablet Take 1 tablet (25 mg total) by mouth daily. 90 tablet 3   ticagrelor (BRILINTA) 90 MG TABS tablet Take 1 tablet (90 mg total) by mouth 2 (two) times daily. 180 tablet 2   No current facility-administered medications for this visit.     Past Surgical History:  Procedure Laterality Date   CORONARY ATHERECTOMY N/A 12/20/2020   Procedure: CORONARY ATHERECTOMY;  Surgeon: 12/22/2020, MD;  Location: [facility] INVASIVE CV LAB;  Service: Cardiovascular;  Laterality: N/A;   INTRAVASCULAR ULTRASOUND/IVUS N/A 12/20/2020   Procedure: Intravascular Ultrasound/IVUS;  Surgeon: 12/22/2020, MD;  Location: [facility] INVASIVE CV LAB;  Service: Cardiovascular;  Laterality: N/A;   LEFT HEART CATH AND CORONARY ANGIOGRAPHY N/A 12/19/2020   Procedure: LEFT HEART CATH AND CORONARY ANGIOGRAPHY;  Surgeon: 02/19/2021, MD;  Location: [facility] INVASIVE CV LAB;  Service: Cardiovascular;  Laterality: N/A;     Allergies  Allergen Reactions   Penicillins     Mouth swells and gets short of breath      Family History  Problem Relation Age of Onset   Diabetes Brother      Social History Mr. Court reports that he quit smoking about 32 years ago. His smoking use included cigarettes. He has a 15.00 pack-year smoking history. He has never been exposed to tobacco smoke. He has never used smokeless tobacco. Mr. Stofko reports current alcohol use of about 10.0 standard drinks per week.   Review of Systems CONSTITUTIONAL: No weight loss, fever, chills, weakness or fatigue.  HEENT: Eyes: No visual loss, blurred vision, double vision or yellow sclerae.No hearing loss, sneezing, congestion, runny  nose or sore throat.  SKIN: No rash or itching.  CARDIOVASCULAR: per hpi RESPIRATORY: No shortness of breath, cough or sputum.  GASTROINTESTINAL: No anorexia, nausea, vomiting or diarrhea. No abdominal pain or blood.  GENITOURINARY: No burning on urination, no polyuria NEUROLOGICAL: No headache, dizziness, syncope, paralysis, ataxia, numbness or tingling in the extremities. No change in bowel or bladder control.  MUSCULOSKELETAL: No muscle, back pain, joint pain or stiffness.  LYMPHATICS: No enlarged nodes. No history of splenectomy.  PSYCHIATRIC: No history of depression or anxiety.  ENDOCRINOLOGIC: No reports of sweating, cold or heat intolerance. No polyuria or polydipsia.  Marland Kitchen   Physical Examination Today's Vitals   04/23/21 1134  BP: 126/82  Pulse: 60  SpO2: 98%  Weight: 266 lb (120.7 kg)  Height: 5\' 11"  (1.803 m)   Body mass index is 37.1 kg/m.  Gen: resting comfortably, no acute distress HEENT: no scleral icterus, pupils equal round and reactive, no palptable cervical adenopathy,  CV: RRR, no m/r/g no jvd Resp: Clear to auscultation bilaterally GI: abdomen is soft, non-tender, non-distended, normal bowel sounds, no hepatosplenomegaly MSK: extremities are warm, no edema.  Skin: warm, no rash Neuro:  no focal deficits Psych: appropriate affect    Assessment and Plan  CAD - overall doing well s/p stenting in July - no specific chest pains or DOE, but continues to have significant fatigue with exertion, feeling "washed out" - symptoms not specificallly cardiac, I do not see indication for repeat ischemic testing. Unclear if possible side effect to brillinta, will change to plavix and monitor. He will take plavix 300mg  x 1 day, then 75mg  daily. Asked patient to discuss these generalized fatigue symptoms with his other physicians as well as not specifically cardiac by descprition  2. Hyperlipidemia - at goal, continue atorvastastin   Due to ongoign fatigue, medication  adjustments, and high physical demand of his job we will extend his work leave 6 weeks from today which would be Dec 26.   MD

## 2021-04-23 NOTE — Patient Instructions (Addendum)
Medication Instructions:  Stop Brilinta. Begin Plavix - 300mg  on day 1, then 75mg  daily thereafter  Continue all other medications.     Labwork: none  Testing/Procedures: none  Follow-Up: 4 months   Any Other Special Instructions Will Be Listed Below (If Applicable).   If you need a refill on your cardiac medications before your next appointment, please call your pharmacy.

## 2021-05-29 ENCOUNTER — Encounter: Payer: Self-pay | Admitting: *Deleted

## 2021-05-29 ENCOUNTER — Telehealth: Payer: Self-pay | Admitting: Cardiology

## 2021-05-29 NOTE — Telephone Encounter (Signed)
Follow Up:    Att: Maryr not Corrie Dandy

## 2021-05-29 NOTE — Telephone Encounter (Signed)
Due to ongoign fatigue, medication adjustments, and high physical demand of his job we will extend his work leave 6 weeks from today which would be Dec 26.

## 2021-05-29 NOTE — Telephone Encounter (Signed)
Nicholas Andersen is calling stating he is needing a return to work note faxed over. He states he has on his FMLA papers that he is to return to work on 06/04/21, but his job is also needing a note faxed to [phone].

## 2021-05-29 NOTE — Telephone Encounter (Signed)
Need to be faxed to [employer] and [employer] Will call back to who letter will be addressed to

## 2021-05-29 NOTE — Telephone Encounter (Signed)
Letter faxed.

## 2021-05-31 NOTE — Telephone Encounter (Signed)
Faxed

## 2021-05-31 NOTE — Telephone Encounter (Signed)
Lawrence Santiago, representative for  [facility] needs the patient's work release letter faxed to her.  She states that their office is going to be the one to release the patient to go back to work.   Please fax letter to [phone] Attn: Steward Drone

## 2021-05-31 NOTE — Telephone Encounter (Deleted)
Nicholas Andersen, representative for

## 2021-07-11 ENCOUNTER — Telehealth (INDEPENDENT_AMBULATORY_CARE_PROVIDER_SITE_OTHER): Payer: Self-pay | Admitting: *Deleted

## 2021-07-11 ENCOUNTER — Telehealth: Payer: Self-pay | Admitting: *Deleted

## 2021-07-11 ENCOUNTER — Encounter (INDEPENDENT_AMBULATORY_CARE_PROVIDER_SITE_OTHER): Payer: Self-pay | Admitting: *Deleted

## 2021-07-11 MED ORDER — PEG 3350-KCL-NA BICARB-NACL 420 G PO SOLR: 4000.0000 mL | Freq: Once | ORAL | 0 refills | Status: AC

## 2021-07-11 NOTE — Telephone Encounter (Signed)
Patient needs trilyte 

## 2021-07-11 NOTE — Telephone Encounter (Signed)
Dr. Wyline Mood to review.  Patient had NSTEMI in July 2022 and underwent DES to LAD on 12/20/2020.  During the previous visit in November, Dr. Wyline Mood has discontinued his Brilinta and switched him to clopidogrel/Plavix after a single 300 mg loading dose.  He is supposed to be on 75 mg daily of Plavix and 81 mg daily of aspirin at this point.  For some reason, Plavix therapy has been removed from his medication list.  However patient is under the impression he is still taking it.  He is unable to check his medication list as he is at work right now.  Dr. Wyline Mood, talking with the patient, he denies any recent exertional chest pain or worsening dyspnea.  He has went back to work since the beginning of this year and has not had any anginal symptoms.  From your standpoint, what would be the earliest time for him to come off of aspirin and Plavix prior to GI procedure?  Again, Plavix appears to have been removed from his medication list, however there is no previous office note to suggest we have discontinued his Plavix.  Therefore I suspect this is likely accidental.  Please forward your recommendation to P [facility] PREOP

## 2021-07-11 NOTE — Telephone Encounter (Signed)
° °  Pre-operative Risk Assessment    Patient Name: Nicholas Andersen  DOB: 31-Jul-1960 MRN: [id number]      Request for Surgical Clearance    Procedure:   COLONOSCOPY  Date of Surgery:  Clearance 08/23/21                                 Surgeon:  DR. Laural Golden Surgeon's Group or Practice Name:  [facility] Phone number:  [phone] Fax number:  [phone]   Type of Clearance Requested:   - Medical  - Pharmacy:  Hold Aspirin and Ticagrelor (Brilinta) (HOLD BRILINTA x 1 DAY PRIOR AND HOLD ASA x 2 DAYS PRIOR )   Type of Anesthesia:  MAC   Additional requests/questions:    [name]   07/11/2021, 12:44 PM

## 2021-07-12 ENCOUNTER — Other Ambulatory Visit: Payer: Self-pay | Admitting: Physician Assistant

## 2021-07-12 MED ORDER — CLOPIDOGREL BISULFATE 75 MG PO TABS: 75.0000 mg | ORAL_TABLET | Freq: Every day | ORAL | Status: AC

## 2021-07-12 NOTE — Telephone Encounter (Signed)
If routine sceening colonoscopy would wait until 12/2021. If more urgent indication reasonable to hold plavix now since he is at least 6 months out from stent and then resume day after procedure  Dominga Ferry MD

## 2021-07-12 NOTE — Telephone Encounter (Signed)
I discussed the case with both the patient's wife and also the GI doctor's office, this is a routine screening colonoscopy only, and is not urgent.  They will cancel the procedure and reschedule it to after July.  GI doctor's office is aware to send [place] another cardiac clearance with time to proceed again.  I also verified with the patient's wife, he is still taking the clopidogrel 75 mg daily.  I have put this medication back on his medication list.  I will remove this cardiac clearance from the pool.

## 2021-08-23 ENCOUNTER — Encounter (HOSPITAL_COMMUNITY): Payer: Self-pay

## 2021-08-23 ENCOUNTER — Ambulatory Visit (HOSPITAL_COMMUNITY): Admit: 2021-08-23 | Payer: BC Managed Care – PPO | Admitting: Internal Medicine

## 2021-08-23 SURGERY — COLONOSCOPY WITH PROPOFOL
Anesthesia: Monitor Anesthesia Care

## 2021-08-27 ENCOUNTER — Encounter: Payer: Self-pay | Admitting: Cardiology

## 2021-08-27 ENCOUNTER — Ambulatory Visit: Payer: BC Managed Care – PPO | Admitting: Cardiology

## 2021-08-27 ENCOUNTER — Encounter: Payer: Self-pay | Admitting: *Deleted

## 2021-08-27 VITALS — BP 120/62 | HR 68 | Ht 71.0 in | Wt 272.0 lb

## 2021-08-27 DIAGNOSIS — I10 Essential (primary) hypertension: Secondary | ICD-10-CM

## 2021-08-27 DIAGNOSIS — E782 Mixed hyperlipidemia: Secondary | ICD-10-CM | POA: Diagnosis not present

## 2021-08-27 DIAGNOSIS — I2510 Atherosclerotic heart disease of native coronary artery without angina pectoris: Secondary | ICD-10-CM

## 2021-08-27 NOTE — Progress Notes (Signed)
? ? ? ?Clinical Summary ?[name] is a 61 y.o.male seen today for follow up of the following medical problems.  ? ? ? ? ?  ?1.CAD/NSTEMI ?- admitted 12/2020 with NSTEMI ?- 12/2020 echo LVEF 50-55%, indet diastolic, normal rV ?- 12/2020 cath: LAD prox 85% and mid 50%, occldued mid LCX, prox RCA 60% ?- had orbtital atherectomy and DES to LAD ?- no beta blocker due to bradycardia ?- 04/2021 echo LVEF 60-65%, no WMAs ?  ?  ?  ?- in cardiac rehab completed 8 weeks ?- no specific exertional chest pains ?- feels fatigued with activities. No specifici chest pains or SOB, just "washed out" with activities.  ?  ?-changed brillinta to plavix due to fatigue, though was unclear was the culprit at the time, though symptoms much improved with change.  ?- no chest pain, no SOB/DOE ? ?2.OSA ?-on cpap,  followed Dr. Heide Spark neurology in [place] ?  ?  ?3. HTN ?-he is compliant with meds ?  ?  ?4. Myasthenia ?- followed by neuro  ?  ?5. Hyperlipdiemia ?- 04/2021 TC 126 TG 62 HDL 44 LDL 70 ?- he is on atorvastatin 80mg  daily ?- reprts had recent labs with pcp ?  ? ?6. Aortic sclerosis ?  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 Works for ? ? ?Past Medical History:  ?Diagnosis Date  ? Asthma   ? Coronary artery disease   ? Gout   ? Hypercholesterolemia   ? Hypertension   ? ? ? ?Allergies  ?Allergen Reactions  ? Penicillins   ?  Mouth swells and gets short of breath  ? ? ? ?Current Outpatient Medications  ?Medication Sig Dispense Refill  ? ADVAIR DISKUS 100-50 MCG/ACT AEPB Inhale 1 puff into the lungs 2 (two) times daily.    ? allopurinol (ZYLOPRIM) 300 MG tablet Take 300 mg by mouth daily.    ? amLODipine-benazepril (LOTREL) 10-20 MG capsule Take 1 capsule by mouth daily.    ? aspirin EC 81 MG tablet Take 81 mg by mouth daily. Swallow whole.    ? atorvastatin (LIPITOR) 80 MG tablet Take 1 tablet (80 mg total) by mouth daily at 6 PM. 90 tablet 1  ? benazepril (LOTENSIN) 20 MG tablet Take 1 tablet (20 mg  total) by mouth daily. 30 tablet 11  ? calcium carbonate (TUMS - DOSED IN MG ELEMENTAL CALCIUM) 500 MG chewable tablet Chew 2 tablets by mouth daily.    ? Multiple Vitamin (MULTIVITAMIN WITH MINERALS) TABS tablet Take 1 tablet by mouth daily.    ? nitroGLYCERIN (NITROSTAT) 0.4 MG SL tablet Place 1 tablet (0.4 mg total) under the tongue every 5 (five) minutes x 3 doses as needed for chest pain. 25 tablet 2  ? pyridostigmine (MESTINON) 60 MG tablet Take 2 tablets by mouth.    ? sildenafil (VIAGRA) 100 MG tablet Take 100 mg by mouth as needed.    ? spironolactone (ALDACTONE) 25 MG tablet Take 1 tablet (25 mg total) by mouth daily. 90 tablet 3  ? ?No current facility-administered medications for this visit.  ? ? ? ?Past Surgical History:  ?Procedure Laterality Date  ? CORONARY ATHERECTOMY N/A 12/20/2020  ? Procedure: CORONARY ATHERECTOMY;  Surgeon: 12/22/2020, MD;  Location: [facility] INVASIVE CV LAB;  Service: Cardiovascular;  Laterality: N/A;  ? INTRAVASCULAR ULTRASOUND/IVUS N/A 12/20/2020  ? Procedure: Intravascular Ultrasound/IVUS;  Surgeon: 12/22/2020, MD;  Location: [facility] INVASIVE CV LAB;  Service: Cardiovascular;  Laterality:  N/A;  ? LEFT HEART CATH AND CORONARY ANGIOGRAPHY N/A 12/19/2020  ? Procedure: LEFT HEART CATH AND CORONARY ANGIOGRAPHY;  Surgeon: Lyn Records, MD;  Location: [facility] INVASIVE CV LAB;  Service: Cardiovascular;  Laterality: N/A;  ? ? ? ?Allergies  ?Allergen Reactions  ? Penicillins   ?  Mouth swells and gets short of breath  ? ? ? ? ?Family History  ?Problem Relation Age of Onset  ? Diabetes Brother   ? ? ? ?Social History ?Mr. Commons reports that he quit smoking about 33 years ago. His smoking use included cigarettes. He has a 15.00 pack-year smoking history. He has never been exposed to tobacco smoke. He has never used smokeless tobacco. ?Mr. Heymann reports current alcohol use of about 10.0 standard drinks per week. ? ? ?Review of Systems ?CONSTITUTIONAL: No weight loss, fever, chills, weakness  or fatigue.  ?HEENT: Eyes: No visual loss, blurred vision, double vision or yellow sclerae.No hearing loss, sneezing, congestion, runny nose or sore throat.  ?SKIN: No rash or itching.  ?CARDIOVASCULAR: per hpi ?RESPIRATORY: No shortness of breath, cough or sputum.  ?GASTROINTESTINAL: No anorexia, nausea, vomiting or diarrhea. No abdominal pain or blood.  ?GENITOURINARY: No burning on urination, no polyuria ?NEUROLOGICAL: No headache, dizziness, syncope, paralysis, ataxia, numbness or tingling in the extremities. No change in bowel or bladder control.  ?MUSCULOSKELETAL: No muscle, back pain, joint pain or stiffness.  ?LYMPHATICS: No enlarged nodes. No history of splenectomy.  ?PSYCHIATRIC: No history of depression or anxiety.  ?ENDOCRINOLOGIC: No reports of sweating, cold or heat intolerance. No polyuria or polydipsia.  ?. ? ? ?Physical Examination ?Today's Vitals  ? 08/27/21 1433  ?BP: 120/62  ?Pulse: 68  ?SpO2: 96%  ?Weight: 272 lb (123.4 kg)  ?Height: 5\' 11"  (1.803 m)  ? ?Body mass index is 37.94 kg/m?. ? ?Gen: resting comfortably, no acute distress ?HEENT: no scleral icterus, pupils equal round and reactive, no palptable cervical adenopathy,  ?CV: RRR, 2/6 systolic murmur rusb, no jvd ?Resp: Clear to auscultation bilaterally ?GI: abdomen is soft, non-tender, non-distended, normal bowel sounds, no hepatosplenomegaly ?MSK: extremities are warm, no edema.  ?Skin: warm, no rash ?Neuro:  no focal deficits ?Psych: appropriate affect ? ? ? ? ? ?Assessment and Plan  ?CAD ?- no symptoms ?- side effects on brillinta, tolerating plavix. Continue DAPT until 12/20/21 ? ?  ?2. Hyperlipidemia ?-request pcp labs, continue current meds ?  ?  ?3. HTN ?-at goal, continue current meds ? ? ? ? ? ?12/22/21, M.D. ?

## 2021-08-27 NOTE — Patient Instructions (Addendum)
Medication Instructions:  ?Your physician has recommended you make the following change in your medication:  ?Stop plavix on 12/20/2021 ?Continue other medications the same ? ?Labwork: ?none ? ?Testing/Procedures: ?none ? ?Follow-Up: ?Your physician recommends that you schedule a follow-up appointment in: 6 months ? ?Any Other Special Instructions Will Be Listed Below (If Applicable). ? ?If you need a refill on your cardiac medications before your next appointment, please call your pharmacy. ?

## 2021-12-17 ENCOUNTER — Ambulatory Visit (HOSPITAL_COMMUNITY)
Admission: RE | Admit: 2021-12-17 | Discharge: 2021-12-17 | Disposition: A | Discharge status: Home / Self Care | Payer: BC Managed Care – PPO | Source: Ambulatory Visit | Attending: Pulmonary Disease | Admitting: Pulmonary Disease

## 2021-12-17 DIAGNOSIS — I700 Atherosclerosis of aorta: Secondary | ICD-10-CM | POA: Diagnosis not present

## 2021-12-17 DIAGNOSIS — R918 Other nonspecific abnormal finding of lung field: Secondary | ICD-10-CM | POA: Diagnosis not present

## 2021-12-17 DIAGNOSIS — R911 Solitary pulmonary nodule: Secondary | ICD-10-CM | POA: Insufficient documentation

## 2022-02-21 ENCOUNTER — Ambulatory Visit: Payer: BC Managed Care – PPO | Admitting: Cardiology

## 2022-03-11 ENCOUNTER — Encounter: Payer: Self-pay | Admitting: Pulmonary Disease

## 2022-03-11 ENCOUNTER — Ambulatory Visit: Payer: BC Managed Care – PPO | Admitting: Pulmonary Disease

## 2022-03-11 VITALS — BP 146/98 | HR 58 | Temp 97.6°F | Ht 71.0 in | Wt 235.0 lb

## 2022-03-11 DIAGNOSIS — G4733 Obstructive sleep apnea (adult) (pediatric): Secondary | ICD-10-CM

## 2022-03-11 DIAGNOSIS — J4520 Mild intermittent asthma, uncomplicated: Secondary | ICD-10-CM | POA: Diagnosis not present

## 2022-03-11 DIAGNOSIS — R911 Solitary pulmonary nodule: Secondary | ICD-10-CM

## 2022-03-11 NOTE — Progress Notes (Signed)
h  [facility]  Chief Complaint  Patient presents with   Follow-up    Appt to review ct results  Needs more cpap supplies       Constitutional:  BP (!) 146/98 (BP Location: Left Arm, Patient Position: Sitting)   Pulse (!) 58   Temp 97.6 F (36.4 C) (Temporal)   Ht 5\' 11"  (1.803 m)   Wt 235 lb (106.6 kg)   SpO2 98% Comment: rA  BMI 32.78 kg/m   Past Medical History:  Asthma, Gout, HLD, HTN, CAD  Past Surgical History:  He  has a past surgical history that includes LEFT HEART CATH AND CORONARY ANGIOGRAPHY (N/A, 12/19/2020); CORONARY ATHERECTOMY (N/A, 12/20/2020); and Intravascular Ultrasound/IVUS (N/A, 12/20/2020).  Brief Summary:  Nicholas Andersen is a 61 y.o. male former smoker with obstructive sleep apnea, asthma, and lung nodule.      Subjective:   His CT chest looked stable.  He was trying to get sleep therapy set up in [place] but this didn't work.  Hasn't been able to get a copy of his sleep study from [place].  He is using a nasal mask because his full face mask isn't working.  He is getting more sinus congestion and dry mouth.  He feels like CPAP helps otherwise.  Uses advair once per day.  He isn't sure this is helping.  He hasn't used albuterol in a long time.  Not having cough, wheeze, sputum, or chest congestion.  Physical Exam:   Appearance - well kempt   ENMT - no sinus tenderness, no oral exudate, no LAN, Mallampati 2 airway, no stridor  Respiratory - equal breath sounds bilaterally, no wheezing or rales  CV - s1s2 regular rate and rhythm, no murmurs  Ext - no clubbing, no edema  Skin - no rashes  Psych - normal mood and affect    Pulmonary testing:    Chest Imaging:  CT angio chest 12/18/20 >> 4 mm nodule LLL CT chest 12/17/21 >> no change  Sleep Tests:    Cardiac Tests:  Echo 04/18/21 >> EF 60 to 65%  Social History:  He  reports that he quit smoking about 33 years ago. His smoking use  included cigarettes. He has a 15.00 pack-year smoking history. He has never been exposed to tobacco smoke. He has never used smokeless tobacco. He reports current alcohol use of about 10.0 standard drinks of alcohol per week. He reports that he does not currently use drugs.  Family History:  His family history includes Diabetes in his brother.     Assessment/Plan:   Obstructive sleep apnea. - unable to get a copy of his previous sleep study - will arrange for a home sleep study - will see if he can get supplies from [place] while setting up his sleep study - he is using a Resmed S10 device he received as a gift  Mild, intermittent asthma. - no significant symptoms - he can try stopping advair  Lung nodule with history of tobacco abuse. - stable, small and likely benign - no additional follow up needed  Time Spent Involved in Patient Care on Day of Examination:  35 minutes  Follow up:   Patient Instructions  Okay to stop using Advair.  Will see if [facility] can arrange for new CPAP supplies while we are arranging for your sleep study.  Will arrange for a home sleep study.  Follow up in 6 months.  Medication List:   Allergies as of  03/11/2022       Reactions   Penicillins    Mouth swells and gets short of breath        Medication List        Accurate as of March 11, 2022  9:33 AM. If you have any questions, ask your nurse or doctor.          STOP taking these medications    Advair Diskus 100-50 MCG/ACT Aepb Generic drug: fluticasone-salmeterol Stopped by: Coralyn Helling, MD       TAKE these medications    allopurinol 300 MG tablet Commonly known as: ZYLOPRIM Take 300 mg by mouth daily.   amLODipine-benazepril 10-20 MG capsule Commonly known as: LOTREL Take 1 capsule by mouth daily.   aspirin EC 81 MG tablet Take 81 mg by mouth daily. Swallow whole.   baclofen 10 MG tablet Commonly known as: LIORESAL Take 10 mg by mouth 2 (two) times  daily as needed.   benazepril 20 MG tablet Commonly known as: LOTENSIN Take 1 tablet (20 mg total) by mouth daily.   calcium carbonate 500 MG chewable tablet Commonly known as: TUMS - dosed in mg elemental calcium Chew 2 tablets by mouth daily.   multivitamin with minerals Tabs tablet Take 1 tablet by mouth daily.   nitroGLYCERIN 0.4 MG SL tablet Commonly known as: NITROSTAT Place 1 tablet (0.4 mg total) under the tongue every 5 (five) minutes x 3 doses as needed for chest pain.   pyridostigmine 60 MG tablet Commonly known as: MESTINON Take 2 tablets by mouth.   rosuvastatin 20 MG tablet Commonly known as: CRESTOR Take by mouth.   sildenafil 100 MG tablet Commonly known as: VIAGRA Take 100 mg by mouth as needed.   spironolactone 25 MG tablet Commonly known as: ALDACTONE Take 1 tablet (25 mg total) by mouth daily.   Wegovy 0.25 MG/0.5ML Soaj Generic drug: Semaglutide-Weight Management Inject into the skin.        Signature:  Coralyn Helling, MD [facility] Pager - [phone] 03/11/2022, 9:33 AM

## 2022-03-11 NOTE — Patient Instructions (Signed)
Okay to stop using Advair.  Will see if [facility] can arrange for new CPAP supplies while we are arranging for your sleep study.  Will arrange for a home sleep study.  Follow up in 6 months.

## 2022-08-19 ENCOUNTER — Other Ambulatory Visit: Payer: Self-pay | Admitting: Cardiology

## 2022-08-19 ENCOUNTER — Other Ambulatory Visit: Payer: Self-pay | Admitting: *Deleted

## 2022-08-19 ENCOUNTER — Telehealth: Payer: Self-pay | Admitting: Cardiology

## 2022-08-19 NOTE — Telephone Encounter (Signed)
Pt c/o medication issue:  1. Name of Medication:  Plavix  2. How are you currently taking this medication (dosage and times per day)?   3. Are you having a reaction (difficulty breathing--STAT)?   4. What is your medication issue?   Patient's wife would like to know if the patient should still be taking Plavix. Please advise.

## 2022-08-19 NOTE — Telephone Encounter (Signed)
Spoke with patient since no DPR on file giving permission to speak with wife. Patient said he was aware that his plavix was stopped on 12/20/2021 by Dr. Harl Bowie.

## 2022-08-19 NOTE — Telephone Encounter (Signed)
Pt's wife, [place], [place] on file, called in asking for a refill for Plavix.  After viewing pt's chart, med was d/c'd, per Dr. Harl Bowie as of 12/20/21.  Pt was very appreciative of the information.

## 2022-08-20 ENCOUNTER — Telehealth: Payer: Self-pay | Admitting: Cardiology

## 2022-08-20 NOTE — Telephone Encounter (Signed)
Patient notified that his Plavix was stopped on 12/20/2021 per Dr. Harl Bowie.  States he was at work yesterday when someone called & did not hear clearly.

## 2022-08-20 NOTE — Telephone Encounter (Signed)
Pt c/o medication issue:  1. Name of Medication: Plavix  2. How are you currently taking this medication (dosage and times per day)?   3. Are you having a reaction (difficulty breathing--STAT)?   4. What is your medication issue? Patient wants clarification that Dr. Harl Bowie did take him off of Plavix.

## 2022-08-28 ENCOUNTER — Encounter: Payer: Self-pay | Admitting: Cardiology

## 2022-08-28 ENCOUNTER — Ambulatory Visit: Payer: BC Managed Care – PPO | Attending: Cardiology | Admitting: Cardiology

## 2022-08-28 VITALS — BP 130/80 | HR 50 | Ht 72.0 in | Wt 247.6 lb

## 2022-08-28 DIAGNOSIS — R001 Bradycardia, unspecified: Secondary | ICD-10-CM | POA: Diagnosis not present

## 2022-08-28 DIAGNOSIS — I2510 Atherosclerotic heart disease of native coronary artery without angina pectoris: Secondary | ICD-10-CM

## 2022-08-28 DIAGNOSIS — E782 Mixed hyperlipidemia: Secondary | ICD-10-CM | POA: Diagnosis not present

## 2022-08-28 DIAGNOSIS — I10 Essential (primary) hypertension: Secondary | ICD-10-CM

## 2022-08-28 NOTE — Progress Notes (Signed)
Clinical Summary Nicholas Andersen is a 61 y.o.male seen today for follow up of the following medical problems.        1.CAD/NSTEMI - admitted 12/2020 with NSTEMI - 12/2020 echo LVEF [id number], indet diastolic, normal rV - 0000000 cath: LAD prox 85% and mid 50%, occldued mid LCX, prox RCA 60% - had orbtital atherectomy and DES to LAD - no beta blocker due to bradycardia - 04/2021 echo LVEF 60-65%, no WMAs    -changed brillinta to plavix due to fatigue, though was unclear was the culprit at the time, though symptoms much improved with change.   -no chest pains, no SOB/DOE - compliant with meds     2.OSA -on cpap,  followed Dr. Rob [place] neurology in [place]     3. 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 Hyperlipdiemia - 04/2021 TC 126 TG 62 HDL 44 LDL 70 - he is on atorvastatin 80mg  daily - reprts had recent labs with pcp  04/2022 TC 107 TG 63 HDL 42 LDL 51     6. Aortic sclerosis  7. 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    SH: works in extreme temperatures at work, does heavy lifting. Works for [employer]   Past Medical History:  Diagnosis Date   Asthma    Coronary artery disease    Gout    Hypercholesterolemia    Hypertension      Allergies  Allergen Reactions   Penicillins     Mouth swells and gets short of breath     Current Outpatient Medications  Medication Sig Dispense Refill   allopurinol (ZYLOPRIM) 300 MG tablet Take 300 mg by mouth daily.     amLODipine-benazepril (LOTREL) 10-20 MG capsule Take 1 capsule by mouth daily.     aspirin EC 81 MG tablet Take 81 mg by mouth daily. Swallow whole.     baclofen (LIORESAL) 10 MG tablet Take 10 mg by mouth 2 (two) times daily as needed.     benazepril (LOTENSIN) 20 MG tablet  Take 1 tablet (20 mg total) by mouth daily. 30 tablet 11   calcium carbonate (TUMS - DOSED IN MG ELEMENTAL CALCIUM) 500 MG chewable tablet Chew 2 tablets by mouth daily.     Multiple Vitamin (MULTIVITAMIN WITH MINERALS) TABS tablet Take 1 tablet by mouth daily.     nitroGLYCERIN (NITROSTAT) 0.4 MG SL tablet Place 1 tablet (0.4 mg total) under the tongue every 5 (five) minutes x 3 doses as needed for chest pain. 25 tablet 2   pyridostigmine (MESTINON) 60 MG tablet Take 2 tablets by mouth.     rosuvastatin (CRESTOR) 20 MG tablet Take by mouth.     sildenafil (VIAGRA) 100 MG tablet Take 100 mg by mouth as needed.     spironolactone (ALDACTONE) 25 MG tablet Take 1 tablet (25 mg total) by mouth daily. 90 tablet 3   WEGOVY 0.25 MG/0.5ML SOAJ Inject into the skin.     No current facility-administered medications for this visit.     Past Surgical History:  Procedure Laterality Date   CORONARY ATHERECTOMY N/A 12/20/2020   Procedure: CORONARY ATHERECTOMY;  Surgeon: Belva Crome, MD;  Location: [facility];  Service: Cardiovascular;  Laterality: N/A;   INTRAVASCULAR ULTRASOUND/IVUS N/A 12/20/2020   Procedure: Intravascular Ultrasound/IVUS;  Surgeon: Belva Crome, MD;  Location: [facility];  Service: Cardiovascular;  Laterality: N/A;   LEFT HEART CATH AND CORONARY ANGIOGRAPHY N/A 12/19/2020   Procedure: LEFT HEART CATH AND CORONARY ANGIOGRAPHY;  Surgeon: Belva Crome, MD;  Location: [facility];  Service: Cardiovascular;  Laterality: N/A;     Allergies  Allergen Reactions   Penicillins     Mouth swells and gets short of breath      Family History  Problem Relation Age of Onset   Diabetes Brother      Social History Mr. Stilts reports that he quit smoking about 34 years ago. His smoking use included cigarettes. He has a 15.00 pack-year smoking history. He has never been exposed to tobacco smoke. He has never used smokeless tobacco. Mr. Krabill reports current  alcohol use of about 10.0 standard drinks of alcohol per week.   Review of Systems CONSTITUTIONAL: No weight loss, fever, chills, weakness or fatigue.  HEENT: Eyes: No visual loss, blurred vision, double vision or yellow sclerae.No hearing loss, sneezing, congestion, runny nose or sore throat.  SKIN: No rash or itching.  CARDIOVASCULAR: per hpi RESPIRATORY: No shortness of breath, cough or sputum.  GASTROINTESTINAL: No anorexia, nausea, vomiting or diarrhea. No abdominal pain or blood.  GENITOURINARY: No burning on urination, no polyuria NEUROLOGICAL: double visition MUSCULOSKELETAL: No muscle, back pain, joint pain or stiffness.  LYMPHATICS: No enlarged nodes. No history of splenectomy.  PSYCHIATRIC: No history of depression or anxiety.  ENDOCRINOLOGIC: No reports of sweating, cold or heat intolerance. No polyuria or polydipsia.  Marland Kitchen   Physical Examination  Gen: resting comfortably, no acute distress HEENT: no scleral icterus, pupils equal round and reactive, no palptable cervical adenopathy,  CV: regular, brady, 2/6 systolic murmur rusb, no jvd Resp: Clear to auscultation bilaterally GI: abdomen is soft, non-tender, non-distended, normal bowel sounds, no hepatosplenomegaly MSK: extremities are warm, no edema.  Skin: warm, no rash Psych: appropriate affect       Assessment and Plan   CAD - no symptoms, continue current meds - no beta blocker due to bradycardia     2. Hyperlipidemia -at goal, continue current meds     3. HTN -bp is at goal, continue current meds  4. Bradycardia - normal HRs typically 50s to 60s - he has pyridostigmine taking higher dose due to severe myasthenia flare, EKG today sinus brady 41 - no symptoms, at this time monitor. If develops symptoms would need to discuss with his neurologist lowering dose or possibly an alternative. - he already is not driving due to his double vision   F/u 6 months   Arnoldo Lenis, M.D.

## 2022-08-28 NOTE — Patient Instructions (Signed)
Medication Instructions:  Continue all current medications.   Labwork: none  Testing/Procedures: none  Follow-Up: 6 months   Any Other Special Instructions Will Be Listed Below (If Applicable).   If you need a refill on your cardiac medications before your next appointment, please call your pharmacy.  

## 2022-10-12 IMAGING — DX DG CHEST 1V PORT
1 series · 1 of 1 positions shown · non-contrast
Comparison: None

CLINICAL DATA: Chest pain, pain in center of chest with bilateral
arm tingling.

EXAM:
PORTABLE CHEST 1 VIEW

[chest ap]
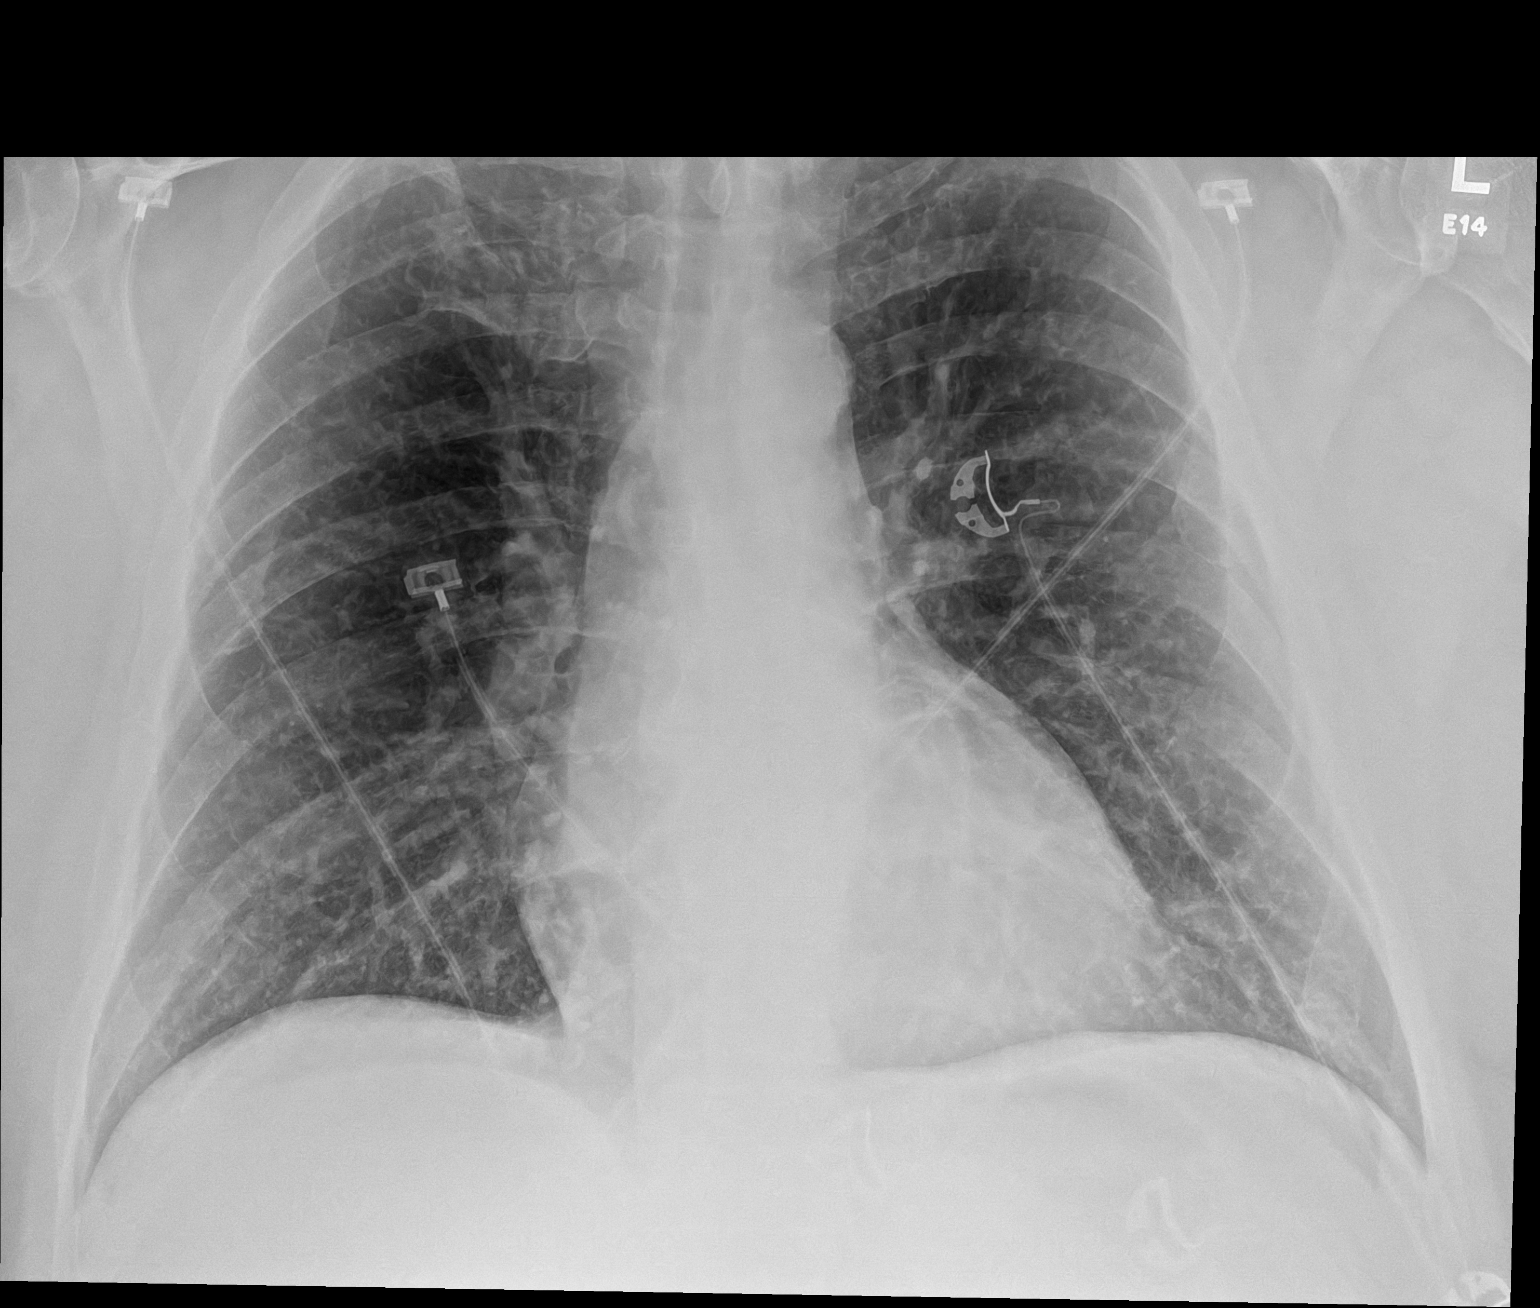

[1 of 1 positions shown; findings below may reference images not displayed]

FINDINGS: EKG leads project over the chest.

Trachea is midline. Cardiomediastinal contours and hilar structures
are normal.

Lungs are clear.  No sign of effusion or pneumothorax.

On limited assessment there is no acute skeletal process.
IMPRESSION: No acute cardiopulmonary disease.

## 2022-10-12 IMAGING — CT CT ANGIO CHEST-ABD-PELV FOR DISSECTION W/ AND WO/W CM
2 of 7 series · 10 of 36 positions shown, 15 images · non-contrast
Comparison: Chest x-ray from same day.

CLINICAL DATA: Acute onset central chest and right shoulder blade
pain.

EXAM:
CT ANGIOGRAPHY CHEST, ABDOMEN AND PELVIS
TECHNIQUE: Non-contrast CT of the chest was initially obtained.

[Series 7: axial arterial · axial · arterial · 0.93mm/px · z∈[-1368,-765]mm · 9 of 239 slices shown, 13 images]
[im 19/239  mediastinal]
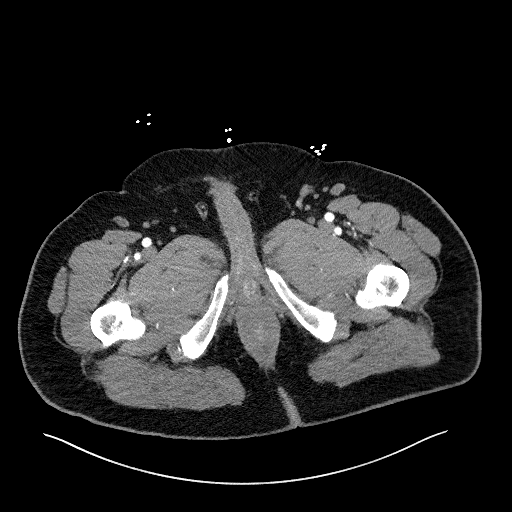
[im 19/239  bone]
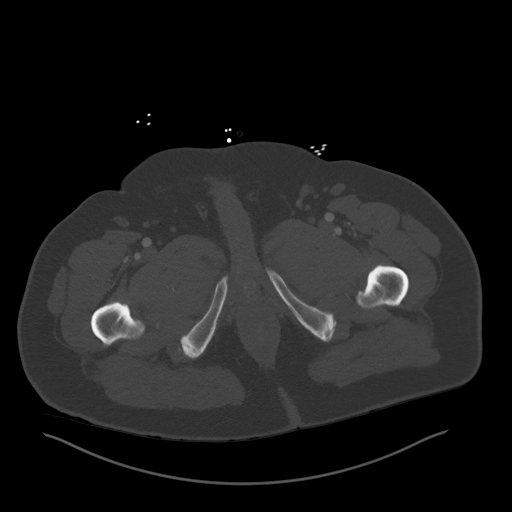
[im 55/239  mediastinal]
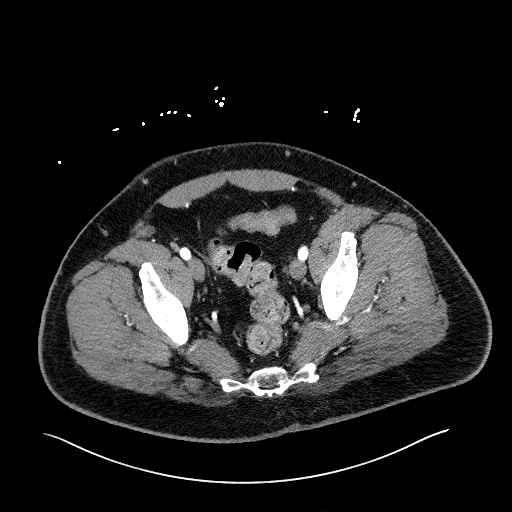
[im 74/239  mediastinal]
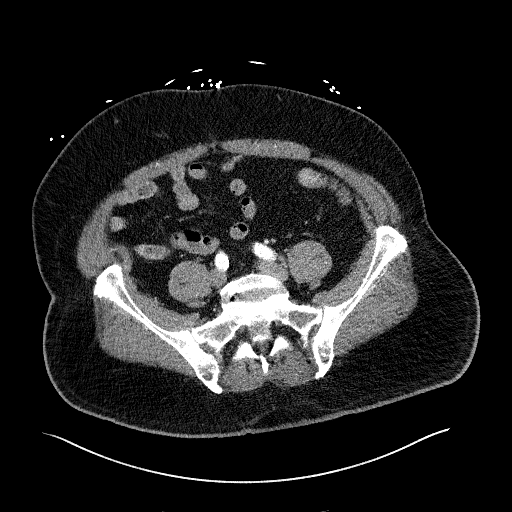
[im 110/239  mediastinal]
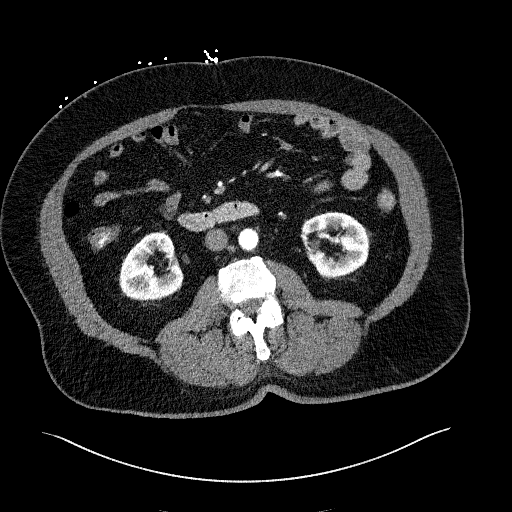
[im 129/239  mediastinal]
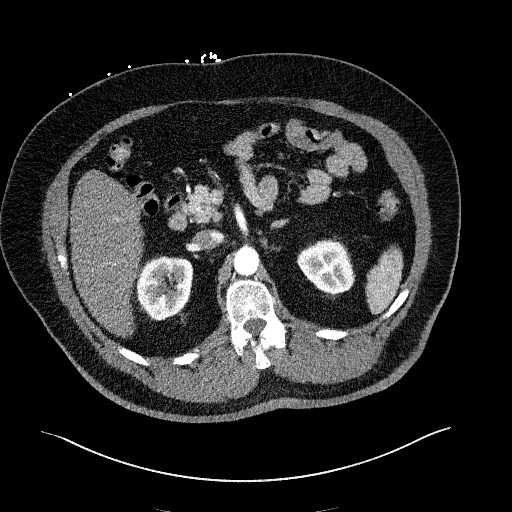
[im 165/239  mediastinal]
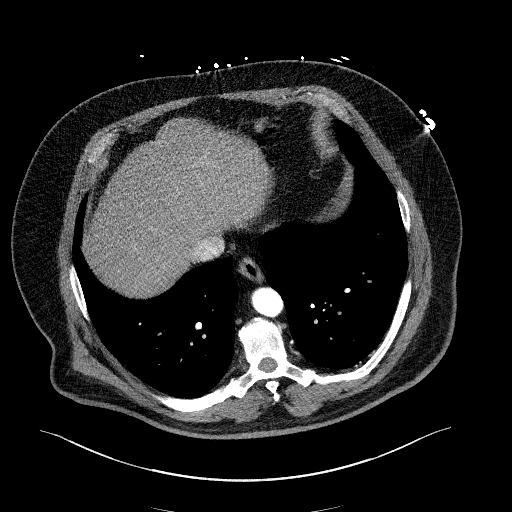
[im 165/239  lung]
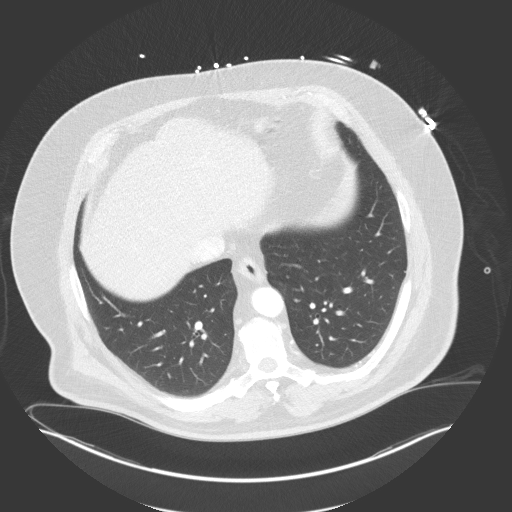
[im 184/239  mediastinal]
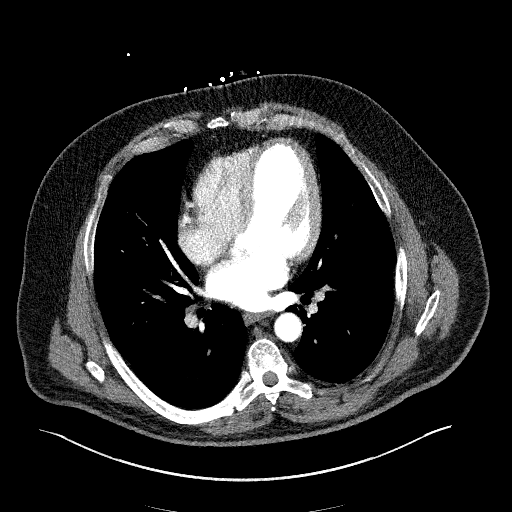
[im 184/239  lung]
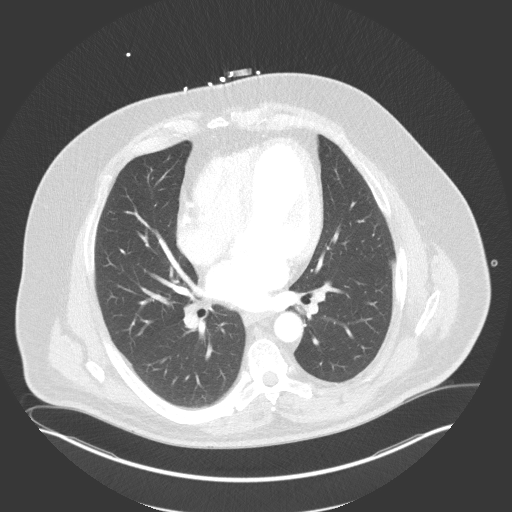
[im 202/239  lung]
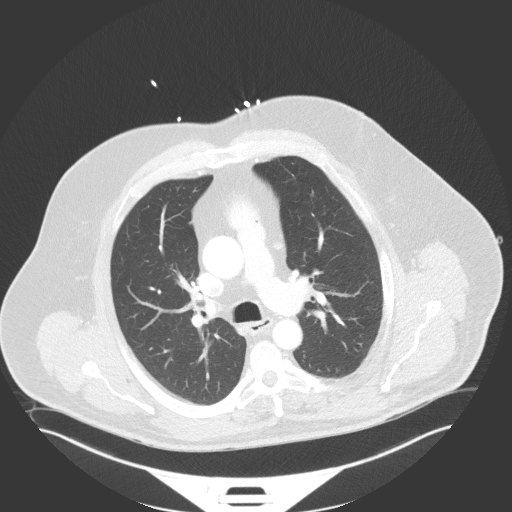
[im 220/239  mediastinal]
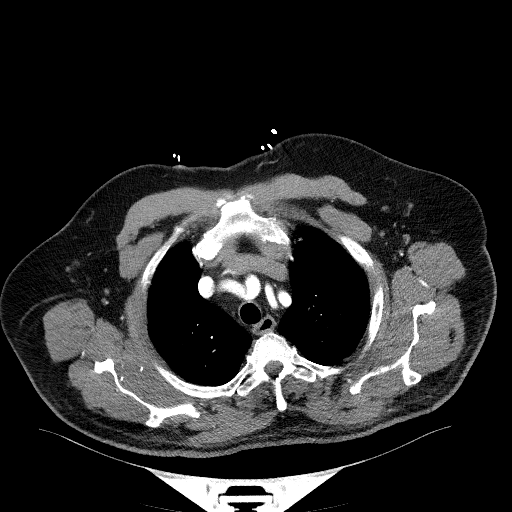
[im 220/239  lung]
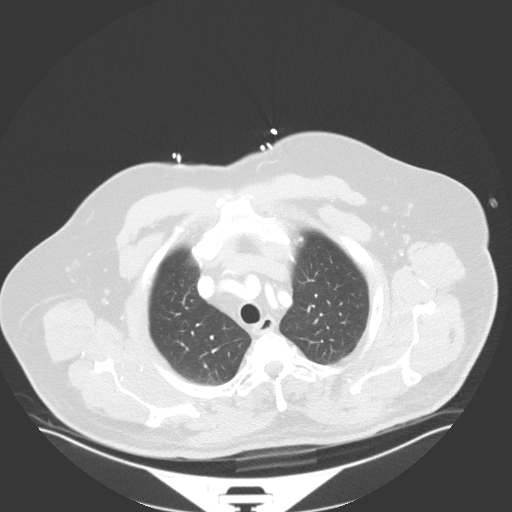

[Series 11: cor soft · coronal · 0.88mm/px · 1 of 205 slices shown, 2 images]
[im 103/205  mediastinal]
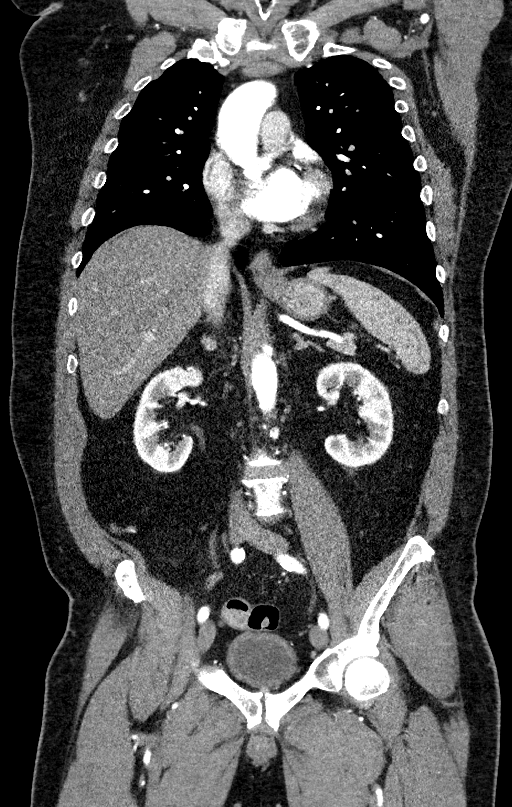
[im 103/205  bone]
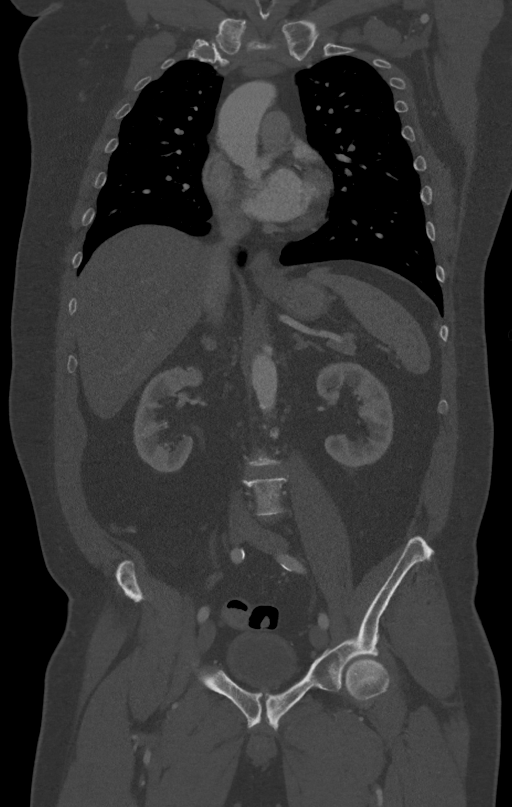

[10 of 36 positions shown; findings below may reference images not displayed]

Multidetector CT imaging through the chest, abdomen and pelvis was
performed using the standard protocol during bolus administration of
intravenous contrast. Multiplanar reconstructed images and MIPs were
obtained and reviewed to evaluate the vascular anatomy.

CONTRAST:  100mL OMNIPAQUE IOHEXOL 350 MG/ML SOLN
FINDINGS: CTA CHEST FINDINGS

Cardiovascular: Preferential opacification of the thoracic aorta. No
evidence of thoracic aortic aneurysm or dissection. Normal heart
size. Three-vessel coronary artery calcification. No pericardial
effusion. No central pulmonary embolism.

Mediastinum/Nodes: No enlarged mediastinal, hilar, or axillary lymph
nodes. Thyroid gland, trachea, and esophagus demonstrate no
significant findings.

Lungs/Pleura: Lungs are clear. No pleural effusion or pneumothorax.
4 mm subpleural nodule in the superior segment of the left lower
lobe (series 9, image 74).

Musculoskeletal: No chest wall abnormality. No acute or significant
osseous findings.

Review of the MIP images confirms the above findings.

CTA ABDOMEN AND PELVIS FINDINGS

VASCULAR

Aorta: Normal caliber aorta without aneurysm, dissection, vasculitis
or significant stenosis. Mild atherosclerotic calcification.

Celiac: Patent. Median arcuate configuration with mild stenosis at
the origin and post-stenotic dilatation to 12 mm.

SMA: Patent without evidence of aneurysm, dissection, vasculitis or
significant stenosis.

Renals: Both renal arteries are patent without evidence of aneurysm,
dissection, vasculitis, fibromuscular dysplasia or significant
stenosis.

IMA: Patent without evidence of aneurysm, dissection, vasculitis or
significant stenosis.

Inflow: Patent without evidence of aneurysm, dissection, vasculitis
or significant stenosis.

Veins: No obvious venous abnormality within the limitations of this
arterial phase study.

Review of the MIP images confirms the above findings.

NON-VASCULAR

Hepatobiliary: Diffusely decreased liver density without focal
abnormality. The gallbladder is unremarkable. No biliary dilatation.

Pancreas: Unremarkable. No pancreatic ductal dilatation or
surrounding inflammatory changes.

Spleen: Normal in size without focal abnormality.

Adrenals/Urinary Tract: The adrenal glands are unremarkable. 1.2 cm
simple cyst in the central midpole of the right kidney. No renal
calculi or hydronephrosis. Mild circumferential bladder wall
thickening is likely related to under distension.

Stomach/Bowel: Stomach is within normal limits. Appendix appears
normal. No evidence of bowel wall thickening, distention, or
inflammatory changes. Few scattered colonic diverticula.

Lymphatic: No enlarged abdominal or pelvic lymph nodes.

Reproductive: Prostate is unremarkable.

Other: No abdominal wall hernia or abnormality. No abdominopelvic
ascites. No pneumoperitoneum.

Musculoskeletal: No acute or significant osseous findings.

Review of the MIP images confirms the above findings.
IMPRESSION: Vascular:

1. No evidence of aortic dissection or aneurysm.
2. Median arcuate configuration of the celiac artery with mild
stenosis at the origin and post-stenotic dilatation to 12 mm.
3. Aortic Atherosclerosis (SQEW7-ON6.6).

Chest:

1.  No acute intrathoracic process.
2. 4 mm subpleural nodule in the left lower lobe. No follow-up
needed if patient is low-risk. Non-contrast chest CT can be
considered in 12 months if patient is high-risk. This recommendation
follows the consensus statement: Guidelines for Management of
Incidental Pulmonary Nodules Detected on CT Images: From the

Abdomen pelvis:

1.  No acute intra-abdominal process.
2. Hepatic steatosis.

## 2022-10-22 ENCOUNTER — Ambulatory Visit: Payer: BC Managed Care – PPO | Admitting: Cardiology

## 2023-02-28 ENCOUNTER — Encounter: Payer: Self-pay | Admitting: Nurse Practitioner

## 2023-02-28 ENCOUNTER — Ambulatory Visit: Payer: BC Managed Care – PPO | Attending: Nurse Practitioner | Admitting: Nurse Practitioner

## 2023-02-28 VITALS — BP 122/78 | HR 75 | Ht 70.0 in | Wt 282.0 lb

## 2023-02-28 DIAGNOSIS — I2510 Atherosclerotic heart disease of native coronary artery without angina pectoris: Secondary | ICD-10-CM | POA: Diagnosis not present

## 2023-02-28 DIAGNOSIS — I10 Essential (primary) hypertension: Secondary | ICD-10-CM

## 2023-02-28 DIAGNOSIS — E785 Hyperlipidemia, unspecified: Secondary | ICD-10-CM

## 2023-02-28 DIAGNOSIS — R0602 Shortness of breath: Secondary | ICD-10-CM | POA: Diagnosis not present

## 2023-02-28 DIAGNOSIS — R011 Cardiac murmur, unspecified: Secondary | ICD-10-CM

## 2023-02-28 NOTE — Progress Notes (Unsigned)
Cardiology Office Note:  .   Date:  02/28/2023 ID:  Nicholas Andersen, DOB 1961-05-23, MRN [id number] PCP: Normajean Glasgow, FNP  [facility] Providers Cardiologist:  Dina Rich, MD    History of Present Illness: .   Nicholas Andersen is a 62 y.o. male with a PMH of CAD, s/p NSTEMI in 2022, HTN, HLD, myasthenia gravis, OSA on CPAP, aortic sclerosis, and hx of sinus bradycardia, who presents today for 6 month follow-up.   Admitted in 2022 with NSTEMI. Cardiac cath revealed 50% mid LAD, 85% prox LAD, occluded mid LCX, prox RCA 60%. Underwent orbital arthrectomy and drug-eluting stent to LAD.  Has not been on beta-blocker due to bradycardia.  Brilinta was changed to Plavix due to fatigue.  Last seen by Dr. Dina Rich on August 28, 2022.  Due to severe myasthenia flare, was taking a higher dose of pyridostigmine, HR 41 bpm, asymptomatic, Dr. Wyline Mood recommended to monitor at the time.  Today he presents for 23-month follow-up with his spouse.  He states he has days he notices his Myasthenia gravis more than others. Wife states summer months tend to be challenging with his condition. He does admit to shortness of breath with exertion, not sure if this is due to his condition or not. Denies any chest pain, palpitations, syncope, presyncope, dizziness, orthopnea, PND, swelling or significant weight changes, acute bleeding, or claudication.   SH: Works for [employer].   ROS: Negative. See HPI.   Studies Reviewed: .    Echo 04/2021:  1. Left ventricular ejection fraction, by estimation, is 60 to 65%. The  left ventricle has normal function. The left ventricle has no regional  wall motion abnormalities. There is mild left ventricular hypertrophy of the basal-septal segment. Left ventricular diastolic parameters were normal.   2. Right ventricular systolic function is normal. The right ventricular  size is normal. Tricuspid regurgitation signal is inadequate for assessing PA pressure.    3. Left atrial size was mildly dilated.   4. The mitral valve is normal in structure. No evidence of mitral valve regurgitation. No evidence of mitral stenosis.   5. The aortic valve was not well visualized. Aortic valve regurgitation  is not visualized. Mild to moderate aortic valve sclerosis/calcification  is present, without any evidence of aortic stenosis. Aortic valve area, by VTI measures 1.98 cm. Aortic valve mean gradient measures 9.0 mmHg. Aortic valve Vmax measures 2.01 m/s.   6. Aortic dilatation noted. There is mild dilatation of the aortic root,  measuring 38 mm.   7. The inferior vena cava is normal in size with greater than 50%  respiratory variability, suggesting right atrial pressure of 3 mmHg.  Coronary atherectomy 12/20/2020:  85% eccentric proximal calcified LAD Medina 1, 1, 1 bifurcation stenosis with a large diagonal was treated with orbital atherectomy after intravascular ultrasound interrogation. Following atherectomy a 4.0 x 28 Synergy stent was placed and postdilated to 5 mm in diameter with provisional bifurcation treatment plan.  Post stenting there was 0% stenosis, TIMI grade III flow, and 40 to 50% ostial narrowing of the diagonal which required no specific treatment.  There was TIMI-3 flow in the diagonal as well.     RECOMMENDATIONS:   Chest discomfort that occurred during the procedure was different than the presenting symptoms.  Perhaps this presentation was due to occlusion of the distal circumflex. Aggressive risk factor modification going forward. Sheath removal per protocol when ACT less than 175 seconds.  LHC 12/19/2020: Total occlusion of distal circumflex, possibly  culprit for ACS/MI. Significant left coronary calcification. Proximal LAD bifurcation stenosis with Medina 110 heavily calcified bifurcation stenosis of 80%.  60% more distal in the mid LAD.  The diagonal at the proximal bifurcation Right coronary is dominant and there is mid eccentric 40 to  50% narrowing. Normal LVEDP LVEF by echo was normal   RECOMMENDATIONS:   Technically difficult procedure from right radial approach due to angulation at the subclavian/innominate artery and innominate aortic bifurcation. Start Brilinta. Orbital atherectomy LAD with planned provisional stenting.  Physical Exam:   VS:  BP 122/78   Pulse 75   Ht 5\' 10"  (1.778 m)   Wt 282 lb (127.9 kg)   SpO2 95%   BMI 40.46 kg/m    Wt Readings from Last 3 Encounters:  02/28/23 282 lb (127.9 kg)  08/28/22 247 lb 9.6 oz (112.3 kg)  03/11/22 235 lb (106.6 kg)    GEN: Obese, 62 y.o. male in no acute distress NECK: No JVD; No carotid bruits CARDIAC: S1/S2, RRR, grade 1/6 murmur, no rubs, no gallops RESPIRATORY:  Clear to auscultation without rales, wheezing or rhonchi  ABDOMEN: Soft, non-tender, non-distended EXTREMITIES:  No edema; No deformity   ASSESSMENT AND PLAN: .    CAD Hx of NSTEMI in 2022 - see cath report above. Denies any chest pain. Continue aspirin, atorvastatin, and NTG PRN. Not on beta blocker d/t past history of bradycardia. Heart healthy diet and regular cardiovascular exercise encouraged.   HLD Recent LDL 68. Continue atorvastatin. Heart healthy diet and regular cardiovascular exercise encouraged.   HTN BP stable. No medication changes at this time. Discussed to monitor BP at home at least 2 hours after medications and sitting for 5-10 minutes. Heart healthy diet and regular cardiovascular exercise encouraged.   DOE, murmur Etiology unclear for DOE, multifactorial. Grade 1/6 murmur on exam. Will obtain Echo for further evaluation. Continue to follow with PCP. Care and ED precautions discussed.  Dispo: Follow-up with me/APP in 3 months or sooner if anything changes.   Signed, Sharlene Dory, NP

## 2023-02-28 NOTE — Patient Instructions (Signed)
Medication Instructions:   Continue all current medications.   Labwork:  none  Testing/Procedures:  Your physician has requested that you have an echocardiogram. Echocardiography is a painless test that uses sound waves to create images of your heart. It provides your doctor with information about the size and shape of your heart and how well your heart's chambers and valves are working. This procedure takes approximately one hour. There are no restrictions for this procedure. Please do NOT wear cologne, perfume, aftershave, or lotions (deodorant is allowed). Please arrive 15 minutes prior to your appointment time.  Office will contact with results via phone, letter or mychart.     Follow-Up:  3 months   Any Other Special Instructions Will Be Listed Below (If Applicable).   If you need a refill on your cardiac medications before your next appointment, please call your pharmacy.

## 2023-03-13 ENCOUNTER — Ambulatory Visit: Payer: BC Managed Care – PPO | Attending: Nurse Practitioner

## 2023-03-13 DIAGNOSIS — R0602 Shortness of breath: Secondary | ICD-10-CM | POA: Diagnosis not present

## 2023-03-13 LAB — ECHOCARDIOGRAM COMPLETE
AR max vel: 2.17 cm2
AV Area VTI: 2.87 cm2
AV Area mean vel: 2.28 cm2
AV Mean grad: 7.7 mm[Hg]
AV Peak grad: 14.1 mm[Hg]
Ao pk vel: 1.88 m/s
Area-P 1/2: 3.56 cm2
Calc EF: 66.3 %
MV VTI: 2.29 cm2
S' Lateral: 3.9 cm
Single Plane A2C EF: 72.6 %
Single Plane A4C EF: 51.4 %

## 2023-05-06 ENCOUNTER — Encounter (HOSPITAL_COMMUNITY)
Admission: RE | Admit: 2023-05-06 | Discharge: 2023-05-06 | Disposition: A | Discharge status: Home / Self Care | Payer: BC Managed Care – PPO | Source: Ambulatory Visit | Attending: Pulmonary Disease | Admitting: Pulmonary Disease

## 2023-05-12 ENCOUNTER — Encounter (HOSPITAL_COMMUNITY)
Admission: RE | Admit: 2023-05-12 | Discharge: 2023-05-12 | Disposition: A | Discharge status: Home / Self Care | Payer: BC Managed Care – PPO | Source: Ambulatory Visit | Attending: Pulmonary Disease | Admitting: Pulmonary Disease

## 2023-05-12 VITALS — Ht 70.5 in | Wt 285.6 lb

## 2023-05-12 DIAGNOSIS — J4531 Mild persistent asthma with (acute) exacerbation: Secondary | ICD-10-CM | POA: Insufficient documentation

## 2023-05-12 NOTE — Progress Notes (Signed)
Pulmonary Individual Treatment Plan  Patient Details  Name: Nicholas Andersen MRN: [id number] Date of Birth: [birth year] Referring Provider:   Flowsheet Row PULMONARY REHAB OTHER RESP ORIENTATION from 05/12/2023 in [facility] CARDIAC REHABILITATION  Referring Provider Merlyn Albert MD       Initial Encounter Date:  Flowsheet Row PULMONARY REHAB OTHER RESP ORIENTATION from 05/12/2023 in [facility] [place] CARDIAC REHABILITATION  Date 05/12/23       Visit Diagnosis: Mild persistent asthma with acute exacerbation  Patient's Home Medications on Admission:   Current Outpatient Medications:    allopurinol (ZYLOPRIM) 100 MG tablet, Take 100 mg by mouth daily., Disp: , Rfl:    amLODipine-benazepril (LOTREL) 10-20 MG capsule, Take 1 capsule by mouth daily., Disp: , Rfl:    aspirin EC 81 MG tablet, Take 81 mg by mouth daily. Swallow whole., Disp: , Rfl:    atorvastatin (LIPITOR) 80 MG tablet, Take 80 mg by mouth daily., Disp: , Rfl:    baclofen (LIORESAL) 10 MG tablet, Take 10 mg by mouth 2 (two) times daily as needed., Disp: , Rfl:    BREO ELLIPTA 200-25 MCG/ACT AEPB, Inhale 1 puff into the lungs daily., Disp: , Rfl:    cetirizine (ZYRTEC) 10 MG tablet, Take 10 mg by mouth daily., Disp: , Rfl:    Cholecalciferol (VITAMIN D3) 50 MCG (2000 UT) TABS, Take 1 tablet by mouth daily., Disp: , Rfl:    Efgartigimod alfa-fcab (VYVGART IV), Inject into the vein every 30 (thirty) days., Disp: , Rfl:    famotidine (PEPCID) 20 MG tablet, Take 1 tablet by mouth 2 (two) times daily., Disp: , Rfl:    Misc. Devices MISC, by Does not apply route. CPAP, Disp: , Rfl:    Multiple Vitamin (MULTIVITAMIN WITH MINERALS) TABS tablet, Take 1 tablet by mouth daily., Disp: , Rfl:    mycophenolate (CELLCEPT) 500 MG tablet, Take 1,000 mg by mouth 2 (two) times daily., Disp: , Rfl:    nitroGLYCERIN (NITROSTAT) 0.4 MG SL tablet, Place 1 tablet (0.4 mg total) under the tongue every 5 (five) minutes x 3 doses as needed for chest  pain., Disp: 25 tablet, Rfl: 2   predniSONE (DELTASONE) 50 MG tablet, Take 50 mg by mouth daily., Disp: , Rfl:    pyridostigmine (MESTINON) 60 MG tablet, Take 30 mg by mouth 2 (two) times daily., Disp: , Rfl:    spironolactone (ALDACTONE) 25 MG tablet, Take 1 tablet (25 mg total) by mouth daily., Disp: 90 tablet, Rfl: 3  Past Medical History: Past Medical History:  Diagnosis Date   Asthma    Coronary artery disease    Gout    Hypercholesterolemia    Hypertension     Tobacco Use: Social History   Tobacco Use  Smoking Status Former   Current packs/day: 0.00   Average packs/day: 1 pack/day for 15.0 years (15.0 ttl pk-yrs)   Types: Cigarettes   Start date: 33   Quit date: 67   Years since quitting: 34.9   Passive exposure: Never  Smokeless Tobacco Never    Labs: Review Flowsheet       Latest Ref Rng & Units 12/19/2020 04/18/2021  Labs for ITP Cardiac and Pulmonary Rehab  Cholestrol 0 - 200 mg/dL 409  811   LDL (calc) 0 - 99 mg/dL 92  70   HDL-C >91 mg/dL 47  44   Trlycerides <478 mg/dL 295  62   Hemoglobin A2Z 4.8 - 5.6 % 5.5  -    Details  Capillary Blood Glucose: No results found for: "GLUCAP"   Pulmonary Assessment Scores:  Pulmonary Assessment Scores     Row Name 05/12/23 1622         ADL UCSD   ADL Phase Entry     SOB Score total 53     Rest 1     Walk 2     Stairs 4     Bath 4     Dress 4     Shop 2       CAT Score   CAT Score 23       mMRC Score   mMRC Score 1             UCSD: Self-administered rating of dyspnea associated with activities of daily living (ADLs) 6-point scale (0 = "not at all" to 5 = "maximal or unable to do because of breathlessness")  Scoring Scores range from 0 to 120.  Minimally important difference is 5 units  CAT: CAT can identify the health impairment of COPD patients and is better correlated with disease progression.  CAT has a scoring range of zero to 40. The CAT score is classified into  four groups of low (less than 10), medium (10 - 20), high (21-30) and very high (31-40) based on the impact level of disease on health status. A CAT score over 10 suggests significant symptoms.  A worsening CAT score could be explained by an exacerbation, poor medication adherence, poor inhaler technique, or progression of COPD or comorbid conditions.  CAT MCID is 2 points  mMRC: mMRC (Modified Medical Research Council) Dyspnea Scale is used to assess the degree of baseline functional disability in patients of respiratory disease due to dyspnea. No minimal important difference is established. A decrease in score of 1 point or greater is considered a positive change.   Pulmonary Function Assessment:  Pulmonary Function Assessment - 05/12/23 1622       Breath   Shortness of Breath Yes;Fear of Shortness of Breath;Limiting activity             Exercise Target Goals: Exercise Program Goal: Individual exercise prescription set using results from initial 6 min walk test and THRR while considering  patient's activity barriers and safety.   Exercise Prescription Goal: Initial exercise prescription builds to 30-45 minutes a day of aerobic activity, 2-3 days per week.  Home exercise guidelines will be given to patient during program as part of exercise prescription that the participant will acknowledge.  Activity Barriers & Risk Stratification:  Activity Barriers & Cardiac Risk Stratification - 05/12/23 1239       Activity Barriers & Cardiac Risk Stratification   Activity Barriers Deconditioning;Muscular Weakness;Shortness of Breath;Back Problems;Neck/Spine Problems;History of Falls;Other (comment)   buldging disc in low back   Comments Myasthenia Gravis (x2 yrs)             6 Minute Walk:  6 Minute Walk     Row Name 05/12/23 1608         6 Minute Walk   Phase Initial     Distance 1280 feet     Walk Time 6 minutes     # of Rest Breaks 0     MPH 2.42     METS 3.05     RPE  13     Perceived Dyspnea  2     VO2 Peak 10.67     Symptoms No     Resting HR 53 bpm     Resting  BP 132/74     Resting Oxygen Saturation  97 %     Exercise Oxygen Saturation  during 6 min walk 94 %     Max Ex. HR 122 bpm     Max Ex. BP 164/84     2 Minute Post BP 152/70       Interval HR   1 Minute HR 84     2 Minute HR 96     3 Minute HR 115     4 Minute HR 122     5 Minute HR 118     6 Minute HR 113     2 Minute Post HR 53     Interval Heart Rate? Yes       Interval Oxygen   Interval Oxygen? Yes     Baseline Oxygen Saturation % 97 %     1 Minute Oxygen Saturation % 94 %     1 Minute Liters of Oxygen 0 L  Room Air     2 Minute Oxygen Saturation % 94 %     2 Minute Liters of Oxygen 0 L     3 Minute Oxygen Saturation % 95 %     3 Minute Liters of Oxygen 0 L     4 Minute Oxygen Saturation % 95 %     4 Minute Liters of Oxygen 0 L     5 Minute Oxygen Saturation % 96 %     5 Minute Liters of Oxygen 0 L     6 Minute Oxygen Saturation % 96 %     6 Minute Liters of Oxygen 0 L     2 Minute Post Oxygen Saturation % 97 %     2 Minute Post Liters of Oxygen 0 L              Oxygen Initial Assessment:  Oxygen Initial Assessment - 05/12/23 1244       Home Oxygen   Home Oxygen Device None    Sleep Oxygen Prescription CPAP;None    Home Exercise Oxygen Prescription None    Home Resting Oxygen Prescription None    Compliance with Home Oxygen Use Yes      Initial 6 min Walk   Oxygen Used None      Program Oxygen Prescription   Program Oxygen Prescription None      Intervention   Short Term Goals To learn and exhibit compliance with exercise, home and travel O2 prescription;To learn and understand importance of monitoring SPO2 with pulse oximeter and demonstrate accurate use of the pulse oximeter.;To learn and understand importance of maintaining oxygen saturations>88%;To learn and demonstrate proper use of respiratory medications;To learn and demonstrate proper pursed lip  breathing techniques or other breathing techniques.     Long  Term Goals Exhibits compliance with exercise, home  and travel O2 prescription;Maintenance of O2 saturations>88%;Compliance with respiratory medication;Verbalizes importance of monitoring SPO2 with pulse oximeter and return demonstration;Exhibits proper breathing techniques, such as pursed lip breathing or other method taught during program session;Demonstrates proper use of MDI's             Oxygen Re-Evaluation:   Oxygen Discharge (Final Oxygen Re-Evaluation):   Initial Exercise Prescription:  Initial Exercise Prescription - 05/12/23 1600       Date of Initial Exercise RX and Referring Provider   Date 05/12/23    Referring Provider Merlyn Albert MD      Oxygen   Maintain Oxygen Saturation 88% or higher  Treadmill   MPH 2.3    Grade 0.5    Minutes 15    METs 2.92      REL-XR   Level 3    Speed 50    Minutes 15    METs 2.5      Prescription Details   Frequency (times per week) 2    Duration Progress to 30 minutes of continuous aerobic without signs/symptoms of physical distress      Intensity   THRR 40-80% of Max Heartrate 95-137    Ratings of Perceived Exertion 11-13    Perceived Dyspnea 0-4      Progression   Progression Continue to progress workloads to maintain intensity without signs/symptoms of physical distress.      Resistance Training   Training Prescription Yes    Weight 3 lb    Reps 10-15             Perform Capillary Blood Glucose checks as needed.  Exercise Prescription Changes:   Exercise Prescription Changes     Row Name 05/12/23 1600             Response to Exercise   Blood Pressure (Admit) 132/74       Blood Pressure (Exercise) 164/84       Blood Pressure (Exit) 142/72       Heart Rate (Admit) 53 bpm       Heart Rate (Exercise) 122 bpm       Heart Rate (Exit) 63 bpm       Oxygen Saturation (Admit) 97 %       Oxygen Saturation (Exercise) 94 %        Oxygen Saturation (Exit) 96 %       Rating of Perceived Exertion (Exercise) 13       Perceived Dyspnea (Exercise) 2       Symptoms SOB       Comments walk test results                Exercise Comments:   Exercise Goals and Review:   Exercise Goals     Row Name 05/12/23 1611             Exercise Goals   Increase Physical Activity Yes       Intervention Provide advice, education, support and counseling about physical activity/exercise needs.;Develop an individualized exercise prescription for aerobic and resistive training based on initial evaluation findings, risk stratification, comorbidities and participant's personal goals.       Expected Outcomes Short Term: Attend rehab on a regular basis to increase amount of physical activity.;Long Term: Add in home exercise to make exercise part of routine and to increase amount of physical activity.;Long Term: Exercising regularly at least 3-5 days a week.       Increase Strength and Stamina Yes       Intervention Provide advice, education, support and counseling about physical activity/exercise needs.;Develop an individualized exercise prescription for aerobic and resistive training based on initial evaluation findings, risk stratification, comorbidities and participant's personal goals.       Expected Outcomes Short Term: Increase workloads from initial exercise prescription for resistance, speed, and METs.;Short Term: Perform resistance training exercises routinely during rehab and add in resistance training at home;Long Term: Improve cardiorespiratory fitness, muscular endurance and strength as measured by increased METs and functional capacity ( )       Able to understand and use rate of perceived exertion (RPE) scale Yes       Intervention  Provide education and explanation on how to use RPE scale       Expected Outcomes Short Term: Able to use RPE daily in rehab to express subjective intensity level;Long Term:  Able to use RPE to  guide intensity level when exercising independently       Able to understand and use Dyspnea scale Yes       Intervention Provide education and explanation on how to use Dyspnea scale       Expected Outcomes Short Term: Able to use Dyspnea scale daily in rehab to express subjective sense of shortness of breath during exertion;Long Term: Able to use Dyspnea scale to guide intensity level when exercising independently       Knowledge and understanding of Target Heart Rate Range (THRR) Yes       Intervention Provide education and explanation of THRR including how the numbers were predicted and where they are located for reference       Expected Outcomes Short Term: Able to state/look up THRR;Long Term: Able to use THRR to govern intensity when exercising independently;Short Term: Able to use daily as guideline for intensity in rehab       Able to check pulse independently Yes       Intervention Review the importance of being able to check your own pulse for safety during independent exercise;Provide education and demonstration on how to check pulse in carotid and radial arteries.       Expected Outcomes Short Term: Able to explain why pulse checking is important during independent exercise;Long Term: Able to check pulse independently and accurately       Understanding of Exercise Prescription Yes       Intervention Provide education, explanation, and written materials on patient's individual exercise prescription       Expected Outcomes Short Term: Able to explain program exercise prescription;Long Term: Able to explain home exercise prescription to exercise independently                Exercise Goals Re-Evaluation :   Discharge Exercise Prescription (Final Exercise Prescription Changes):  Exercise Prescription Changes - 05/12/23 1600       Response to Exercise   Blood Pressure (Admit) 132/74    Blood Pressure (Exercise) 164/84    Blood Pressure (Exit) 142/72    Heart Rate (Admit) 53 bpm     Heart Rate (Exercise) 122 bpm    Heart Rate (Exit) 63 bpm    Oxygen Saturation (Admit) 97 %    Oxygen Saturation (Exercise) 94 %    Oxygen Saturation (Exit) 96 %    Rating of Perceived Exertion (Exercise) 13    Perceived Dyspnea (Exercise) 2    Symptoms SOB    Comments walk test results             Nutrition:  Target Goals: Understanding of nutrition guidelines, daily intake of sodium 1500mg , cholesterol 200mg , calories 30% from fat and 7% or less from saturated fats, daily to have 5 or more servings of fruits and vegetables.  Biometrics:  Pre Biometrics - 05/12/23 1612       Pre Biometrics   Height 5' 10.5" (1.791 m)    Weight 129.5 kg    Waist Circumference 48 inches    Hip Circumference 45.5 inches    Waist to Hip Ratio 1.05 %    BMI (Calculated) 40.39    Grip Strength 47.3 kg    Single Leg Stand 23.8 seconds  Nutrition Therapy Plan and Nutrition Goals:  Nutrition Therapy & Goals - 05/12/23 1621       Intervention Plan   Intervention Prescribe, educate and counsel regarding individualized specific dietary modifications aiming towards targeted core components such as weight, hypertension, lipid management, diabetes, heart failure and other comorbidities.;Nutrition handout(s) given to patient.    Expected Outcomes Short Term Goal: Understand basic principles of dietary content, such as calories, fat, sodium, cholesterol and nutrients.;Long Term Goal: Adherence to prescribed nutrition plan.             Nutrition Assessments:  MEDIFICTS Score Key: >=70 Need to make dietary changes  40-70 Heart Healthy Diet <= 40 Therapeutic Level Cholesterol Diet  Flowsheet Row PULMONARY REHAB OTHER RESP ORIENTATION from 05/12/2023 in [facility] CARDIAC REHABILITATION  Picture Your Plate Total Score on Admission 58      Picture Your Plate Scores: <16 Unhealthy dietary pattern with much room for improvement. 41-50 Dietary pattern unlikely to meet  recommendations for good health and room for improvement. 51-60 More healthful dietary pattern, with some room for improvement.  >60 Healthy dietary pattern, although there may be some specific behaviors that could be improved.    Nutrition Goals Re-Evaluation:   Nutrition Goals Discharge (Final Nutrition Goals Re-Evaluation):   Psychosocial: Target Goals: Acknowledge presence or absence of significant depression and/or stress, maximize coping skills, provide positive support system. Participant is able to verbalize types and ability to use techniques and skills needed for reducing stress and depression.  Initial Review & Psychosocial Screening:  Initial Psych Review & Screening - 05/12/23 1237       Initial Review   Current issues with History of Depression;Current Stress Concerns    Source of Stress Concerns Chronic Illness;Financial;Occupation;Unable to participate in former interests or hobbies;Unable to perform yard/household activities    Comments worried about future with myastenia and job, currently filing for disability, breathing keeping him from doing activities, uses CPAP at night usually 7 hrs at night      Family Dynamics   Good Support System? Yes   wife (nurse), 3 brothers, kids, neighbors     Barriers   Psychosocial barriers to participate in program Psychosocial barriers identified (see note);The patient should benefit from training in stress management and relaxation.      Screening Interventions   Interventions Encouraged to exercise;Provide feedback about the scores to participant;To provide support and resources with identified psychosocial needs    Expected Outcomes Short Term goal: Utilizing psychosocial counselor, staff and physician to assist with identification of specific Stressors or current issues interfering with healing process. Setting desired goal for each stressor or current issue identified.;Long Term Goal: Stressors or current issues are controlled  or eliminated.;Short Term goal: Identification and review with participant of any Quality of Life or Depression concerns found by scoring the questionnaire.;Long Term goal: The participant improves quality of Life and PHQ9 Scores as seen by post scores and/or verbalization of changes             Quality of Life Scores:  Scores of 19 and below usually indicate a poorer quality of life in these areas.  A difference of  2-3 points is a clinically meaningful difference.  A difference of 2-3 points in the total score of the Quality of Life Index has been associated with significant improvement in overall quality of life, self-image, physical symptoms, and general health in studies assessing change in quality of life.   PHQ-9: Review Flowsheet  05/12/2023  Depression screen PHQ 2/9  Decreased Interest 2  Down, Depressed, Hopeless 0  PHQ - 2 Score 2  Altered sleeping 2  Tired, decreased energy 2  Change in appetite 2  Feeling bad or failure about yourself  2  Trouble concentrating 1  Moving slowly or fidgety/restless 1  Suicidal thoughts 0  PHQ-9 Score 12  Difficult doing work/chores Somewhat difficult    Details           Interpretation of Total Score  Total Score Depression Severity:  1-4 = Minimal depression, 5-9 = Mild depression, 10-14 = Moderate depression, 15-19 = Moderately severe depression, 20-27 = Severe depression   Psychosocial Evaluation and Intervention:  Psychosocial Evaluation - 05/12/23 1612       Psychosocial Evaluation & Interventions   Interventions Stress management education;Relaxation education;Encouraged to exercise with the program and follow exercise prescription    Comments Moise Boring is coming into Pulmonary Rehab with asthma.  He had done rehab before after his heart attack.  He is worried about finances and affording gas to get here.  He is currently applying for disability but has yet to get any payment yet.  He has also been out of work so no  money has been coming in recently.  He is starting with 2 days a week to help with affordability.  If he has to go back to work, that will interfere with his scheduling as well.  He has a great support system in his wife, kids, 3 brothers, and neighbors.  He states that everyone keeps checking up on him to make sure he is good.  He sleeps well with his CPAP and is very consistent with it.  He usually sleeps about 6-7 hrs but recently only been getting 4-5 hrs from being up with prednisone again.  He has gained almost 60 lbs since starting prednisone in February and really would like to lose the weight again.  He is looking forward to improving his breathing and overall mobility, especially if he can get the weight back under better control.  Back in his 30s when this flared up the first time, he gained 80 lb and was able to get it back off.    Expected Outcomes Short: Attend rehab regularly to build up strength and breathing Long: Work on weight loss with exercise    Continue Psychosocial Services  Follow up required by staff             Psychosocial Re-Evaluation:   Psychosocial Discharge (Final Psychosocial Re-Evaluation):    Education: Education Goals: Education classes will be provided on a weekly basis, covering required topics. Participant will state understanding/return demonstration of topics presented.  Learning Barriers/Preferences:  Learning Barriers/Preferences - 05/12/23 1233       Learning Barriers/Preferences   Learning Barriers Sight   glasses for reading   Learning Preferences Skilled Demonstration             Education Topics: How Lungs Work and Diseases: - Discuss the anatomy of the lungs and diseases that can affect the lungs, such as COPD.   Exercise: -Discuss the importance of exercise, FITT principles of exercise, normal and abnormal responses to exercise, and how to exercise safely.   Environmental Irritants: -Discuss types of environmental  irritants and how to limit exposure to environmental irritants.   Meds/Inhalers and oxygen: - Discuss respiratory medications, definition of an inhaler and oxygen, and the proper way to use an inhaler and oxygen.   Energy Saving Techniques: -  Discuss methods to conserve energy and decrease shortness of breath when performing activities of daily living.    Bronchial Hygiene / Breathing Techniques: - Discuss breathing mechanics, pursed-lip breathing technique,  proper posture, effective ways to clear airways, and other functional breathing techniques   Cleaning Equipment: - Provides group verbal and written instruction about the health risks of elevated stress, cause of high stress, and healthy ways to reduce stress.   Nutrition I: Fats: - Discuss the types of cholesterol, what cholesterol does to the body, and how cholesterol levels can be controlled.   Nutrition II: Labels: -Discuss the different components of food labels and how to read food labels.   Respiratory Infections: - Discuss the signs and symptoms of respiratory infections, ways to prevent respiratory infections, and the importance of seeking medical treatment when having a respiratory infection.   Stress I: Signs and Symptoms: - Discuss the causes of stress, how stress may lead to anxiety and depression, and ways to limit stress.   Stress II: Relaxation: -Discuss relaxation techniques to limit stress.   Oxygen for Home/Travel: - Discuss how to prepare for travel when on oxygen and proper ways to transport and store oxygen to ensure safety.   Knowledge Questionnaire Score:  Knowledge Questionnaire Score - 05/12/23 1621       Knowledge Questionnaire Score   Pre Score 16/18             Core Components/Risk Factors/Patient Goals at Admission:  Personal Goals and Risk Factors at Admission - 05/12/23 1621       Core Components/Risk Factors/Patient Goals on Admission    Weight Management  Yes;Obesity;Weight Loss    Intervention Weight Management: Develop a combined nutrition and exercise program designed to reach desired caloric intake, while maintaining appropriate intake of nutrient and fiber, sodium and fats, and appropriate energy expenditure required for the weight goal.;Weight Management: Provide education and appropriate resources to help participant work on and attain dietary goals.;Weight Management/Obesity: Establish reasonable short term and long term weight goals.;Obesity: Provide education and appropriate resources to help participant work on and attain dietary goals.    Admit Weight 285 lb 9.6 oz (129.5 kg)    Goal Weight: Short Term 280 lb (127 kg)    Goal Weight: Long Term 275 lb (124.7 kg)    Expected Outcomes Short Term: Continue to assess and modify interventions until short term weight is achieved;Long Term: Adherence to nutrition and physical activity/exercise program aimed toward attainment of established weight goal;Weight Loss: Understanding of general recommendations for a balanced deficit meal plan, which promotes 1-2 lb weight loss per week and includes a negative energy balance of [phone] kcal/d;Understanding recommendations for meals to include 15-35% energy as protein, 25-35% energy from fat, 35-60% energy from carbohydrates, less than 200mg  of dietary cholesterol, 20-35 gm of total fiber daily;Understanding of distribution of calorie intake throughout the day with the consumption of 4-5 meals/snacks    Improve shortness of breath with ADL's Yes    Intervention Provide education, individualized exercise plan and daily activity instruction to help decrease symptoms of SOB with activities of daily living.    Expected Outcomes Short Term: Improve cardiorespiratory fitness to achieve a reduction of symptoms when performing ADLs;Long Term: Be able to perform more ADLs without symptoms or delay the onset of symptoms    Increase knowledge of respiratory medications  and ability to use respiratory devices properly  Yes    Intervention Provide education and demonstration as needed of appropriate use of medications, inhalers,  and oxygen therapy.    Expected Outcomes Short Term: Achieves understanding of medications use. Understands that oxygen is a medication prescribed by physician. Demonstrates appropriate use of inhaler and oxygen therapy.;Long Term: Maintain appropriate use of medications, inhalers, and oxygen therapy.    Hypertension Yes    Intervention Provide education on lifestyle modifcations including regular physical activity/exercise, weight management, moderate sodium restriction and increased consumption of fresh fruit, vegetables, and low fat dairy, alcohol moderation, and smoking cessation.;Monitor prescription use compliance.    Expected Outcomes Short Term: Continued assessment and intervention until BP is < 140/96mm HG in hypertensive participants. < 130/105mm HG in hypertensive participants with diabetes, heart failure or chronic kidney disease.;Long Term: Maintenance of blood pressure at goal levels.    Lipids Yes    Intervention Provide education and support for participant on nutrition & aerobic/resistive exercise along with prescribed medications to achieve LDL 70mg , HDL >40mg .    Expected Outcomes Short Term: Participant states understanding of desired cholesterol values and is compliant with medications prescribed. Participant is following exercise prescription and nutrition guidelines.;Long Term: Cholesterol controlled with medications as prescribed, with individualized exercise RX and with personalized nutrition plan. Value goals: LDL < 70mg , HDL > 40 mg.             Core Components/Risk Factors/Patient Goals Review:    Core Components/Risk Factors/Patient Goals at Discharge (Final Review):    ITP Comments:  ITP Comments     Row Name 05/12/23 1606           ITP Comments Patient attend orientation today.  Patient is  attending Pulmonary Rehabilitation Program.  Documentation for diagnosis can be found in [facility] media tab for Cardiac Rehab.  Reviewed medical chart, RPE/RPD, gym safety, and program guidelines.  Patient was fitted to equipment they will be using during rehab.  Patient is scheduled to start exercise on Tuesday 05/13/23 at 3pm.   Initial ITP created and sent for review and signature by Dr. Erick Blinks, Medical Director for Pulmonary Rehabilitation Program.                Comments: Initial ITP

## 2023-05-12 NOTE — Patient Instructions (Signed)
Patient Instructions  Patient Details  Name: Nicholas Andersen MRN: [id number] Date of Birth: 19-Oct-1960 Referring Provider:  Leona Carry, MD  Below are your personal goals for exercise, nutrition, and risk factors. Our goal is to help you stay on track towards obtaining and maintaining these goals. We will be discussing your progress on these goals with you throughout the program.  Initial Exercise Prescription:  Initial Exercise Prescription - 05/12/23 1600       Date of Initial Exercise RX and Referring Provider   Date 05/12/23    Referring Provider Alghrim, Rondall Allegra MD      Oxygen   Maintain Oxygen Saturation 88% or higher      Treadmill   MPH 2.3    Grade 0.5    Minutes 15    METs 2.92      REL-XR   Level 3    Speed 50    Minutes 15    METs 2.5      Prescription Details   Frequency (times per week) 2    Duration Progress to 30 minutes of continuous aerobic without signs/symptoms of physical distress      Intensity   THRR 40-80% of Max Heartrate 95-137    Ratings of Perceived Exertion 11-13    Perceived Dyspnea 0-4      Progression   Progression Continue to progress workloads to maintain intensity without signs/symptoms of physical distress.      Resistance Training   Training Prescription Yes    Weight 3 lb    Reps 10-15             Exercise Goals: Frequency: Be able to perform aerobic exercise two to three times per week in program working toward 2-5 days per week of home exercise.  Intensity: Work with a perceived exertion of 11 (fairly light) - 15 (hard) while following your exercise prescription.  We will make changes to your prescription with you as you progress through the program.   Duration: Be able to do 30 to 45 minutes of continuous aerobic exercise in addition to a 5 minute warm-up and a 5 minute cool-down routine.   Nutrition Goals: Your personal nutrition goals will be established when you do your nutrition analysis with the  dietician.  The following are general nutrition guidelines to follow: Cholesterol < 200mg /day Sodium < 1500mg /day Fiber: Men over 50 yrs - 30 grams per day  Personal Goals:  Personal Goals and Risk Factors at Admission - 05/12/23 1621       Core Components/Risk Factors/Patient Goals on Admission    Weight Management Yes;Obesity;Weight Loss    Intervention Weight Management: Develop a combined nutrition and exercise program designed to reach desired caloric intake, while maintaining appropriate intake of nutrient and fiber, sodium and fats, and appropriate energy expenditure required for the weight goal.;Weight Management: Provide education and appropriate resources to help participant work on and attain dietary goals.;Weight Management/Obesity: Establish reasonable short term and long term weight goals.;Obesity: Provide education and appropriate resources to help participant work on and attain dietary goals.    Admit Weight 285 lb 9.6 oz (129.5 kg)    Goal Weight: Short Term 280 lb (127 kg)    Goal Weight: Long Term 275 lb (124.7 kg)    Expected Outcomes Short Term: Continue to assess and modify interventions until short term weight is achieved;Long Term: Adherence to nutrition and physical activity/exercise program aimed toward attainment of established weight goal;Weight Loss: Understanding of general recommendations for a balanced deficit  meal plan, which promotes 1-2 lb weight loss per week and includes a negative energy balance of [id number] kcal/d;Understanding recommendations for meals to include 15-35% energy as protein, 25-35% energy from fat, 35-60% energy from carbohydrates, less than 200mg  of dietary cholesterol, 20-35 gm of total fiber daily;Understanding of distribution of calorie intake throughout the day with the consumption of 4-5 meals/snacks    Improve shortness of breath with ADL's Yes    Intervention Provide education, individualized exercise plan and daily activity instruction  to help decrease symptoms of SOB with activities of daily living.    Expected Outcomes Short Term: Improve cardiorespiratory fitness to achieve a reduction of symptoms when performing ADLs;Long Term: Be able to perform more ADLs without symptoms or delay the onset of symptoms    Increase knowledge of respiratory medications and ability to use respiratory devices properly  Yes    Intervention Provide education and demonstration as needed of appropriate use of medications, inhalers, and oxygen therapy.    Expected Outcomes Short Term: Achieves understanding of medications use. Understands that oxygen is a medication prescribed by physician. Demonstrates appropriate use of inhaler and oxygen therapy.;Long Term: Maintain appropriate use of medications, inhalers, and oxygen therapy.    Hypertension Yes    Intervention Provide education on lifestyle modifcations including regular physical activity/exercise, weight management, moderate sodium restriction and increased consumption of fresh fruit, vegetables, and low fat dairy, alcohol moderation, and smoking cessation.;Monitor prescription use compliance.    Expected Outcomes Short Term: Continued assessment and intervention until BP is < 140/45mm HG in hypertensive participants. < 130/82mm HG in hypertensive participants with diabetes, heart failure or chronic kidney disease.;Long Term: Maintenance of blood pressure at goal levels.    Lipids Yes    Intervention Provide education and support for participant on nutrition & aerobic/resistive exercise along with prescribed medications to achieve LDL 70mg , HDL >40mg .    Expected Outcomes Short Term: Participant states understanding of desired cholesterol values and is compliant with medications prescribed. Participant is following exercise prescription and nutrition guidelines.;Long Term: Cholesterol controlled with medications as prescribed, with individualized exercise RX and with personalized nutrition plan. Value  goals: LDL < 70mg , HDL > 40 mg.             Tobacco Use Initial Evaluation: Social History   Tobacco Use  Smoking Status Former   Current packs/day: 0.00   Average packs/day: 1 pack/day for 15.0 years (15.0 ttl pk-yrs)   Types: Cigarettes   Start date: 40   Quit date: 61   Years since quitting: 34.9   Passive exposure: Never  Smokeless Tobacco Never    Exercise Goals and Review:  Exercise Goals     Row Name 05/12/23 1611             Exercise Goals   Increase Physical Activity Yes       Intervention Provide advice, education, support and counseling about physical activity/exercise needs.;Develop an individualized exercise prescription for aerobic and resistive training based on initial evaluation findings, risk stratification, comorbidities and participant's personal goals.       Expected Outcomes Short Term: Attend rehab on a regular basis to increase amount of physical activity.;Long Term: Add in home exercise to make exercise part of routine and to increase amount of physical activity.;Long Term: Exercising regularly at least 3-5 days a week.       Increase Strength and Stamina Yes       Intervention Provide advice, education, support and counseling about physical activity/exercise needs.;Develop an  individualized exercise prescription for aerobic and resistive training based on initial evaluation findings, risk stratification, comorbidities and participant's personal goals.       Expected Outcomes Short Term: Increase workloads from initial exercise prescription for resistance, speed, and METs.;Short Term: Perform resistance training exercises routinely during rehab and add in resistance training at home;Long Term: Improve cardiorespiratory fitness, muscular endurance and strength as measured by increased METs and functional capacity ( )       Able to understand and use rate of perceived exertion (RPE) scale Yes       Intervention Provide education and explanation on  how to use RPE scale       Expected Outcomes Short Term: Able to use RPE daily in rehab to express subjective intensity level;Long Term:  Able to use RPE to guide intensity level when exercising independently       Able to understand and use Dyspnea scale Yes       Intervention Provide education and explanation on how to use Dyspnea scale       Expected Outcomes Short Term: Able to use Dyspnea scale daily in rehab to express subjective sense of shortness of breath during exertion;Long Term: Able to use Dyspnea scale to guide intensity level when exercising independently       Knowledge and understanding of Target Heart Rate Range (THRR) Yes       Intervention Provide education and explanation of THRR including how the numbers were predicted and where they are located for reference       Expected Outcomes Short Term: Able to state/look up THRR;Long Term: Able to use THRR to govern intensity when exercising independently;Short Term: Able to use daily as guideline for intensity in rehab       Able to check pulse independently Yes       Intervention Review the importance of being able to check your own pulse for safety during independent exercise;Provide education and demonstration on how to check pulse in carotid and radial arteries.       Expected Outcomes Short Term: Able to explain why pulse checking is important during independent exercise;Long Term: Able to check pulse independently and accurately       Understanding of Exercise Prescription Yes       Intervention Provide education, explanation, and written materials on patient's individual exercise prescription       Expected Outcomes Short Term: Able to explain program exercise prescription;Long Term: Able to explain home exercise prescription to exercise independently              Copy of goals given to participant.

## 2023-05-13 ENCOUNTER — Encounter (HOSPITAL_COMMUNITY)
Admission: RE | Admit: 2023-05-13 | Discharge: 2023-05-13 | Disposition: A | Discharge status: Home / Self Care | Payer: BC Managed Care – PPO | Source: Ambulatory Visit | Attending: Pulmonary Disease | Admitting: Pulmonary Disease

## 2023-05-13 DIAGNOSIS — J4531 Mild persistent asthma with (acute) exacerbation: Secondary | ICD-10-CM | POA: Diagnosis not present

## 2023-05-13 NOTE — Progress Notes (Signed)
Daily Session Note  Patient Details  Name: Nicholas Andersen MRN: [id number] Date of Birth: 1960/07/06 Referring Provider:   Flowsheet Row PULMONARY REHAB OTHER RESP ORIENTATION from 05/12/2023 in [facility] CARDIAC REH[facility]ITATION  Referring Provider Merlyn Albert MD       Encounter Date: 05/13/2023  Check In:  Session Check In - 05/13/23 1500       Check-In   Supervising physician immediately available to respond to emergencies See telemetry face sheet for immediately available MD    Location [facility]    Staff Present Ross Ludwig, BS, Exercise Physiologist;Phyllis Billingsley, Sammie Bench, RN, BSN    Virtual Visit No    Medication changes reported     No    Fall or balance concerns reported    No    Tobacco Cessation No Change    Warm-up and Cool-down Performed on first and last piece of equipment    Resistance Training Performed Yes    VAD Patient? No    PAD/SET Patient? No      Pain Assessment   Currently in Pain? No/denies    Multiple Pain Sites No             Capillary Blood Glucose: No results found for this or any previous visit (from the past 24 hour(s)).    Social History   Tobacco Use  Smoking Status Former   Current packs/day: 0.00   Average packs/day: 1 pack/day for 15.0 years (15.0 ttl pk-yrs)   Types: Cigarettes   Start date: 38   Quit date: 89   Years since quitting: 34.9   Passive exposure: Never  Smokeless Tobacco Never    Goals Met:  Independence with exercise equipment Exercise tolerated well Queuing for purse lip breathing No report of concerns or symptoms today Strength training completed today  Goals Unmet:  Not Applicable  Comments: First full day of exercise!  Patient was oriented to gym and equipment including functions, settings, policies, and procedures.  Patient's individual exercise prescription and treatment plan were reviewed.  All starting workloads were established based on the results  of the 6 minute walk test done at initial orientation visit.  The plan for exercise progression was also introduced and progression will be customized based on patient's performance and goals.

## 2023-05-14 ENCOUNTER — Encounter (HOSPITAL_COMMUNITY): Payer: Self-pay | Admitting: *Deleted

## 2023-05-14 DIAGNOSIS — J4531 Mild persistent asthma with (acute) exacerbation: Secondary | ICD-10-CM

## 2023-05-14 NOTE — Progress Notes (Signed)
Pulmonary Individual Treatment Plan  Patient Details  Name: Nicholas Andersen MRN: [id number] Date of Birth: 12-13-1960 Referring Provider:   Flowsheet Row PULMONARY REHAB OTHER RESP ORIENTATION from 05/12/2023 in [facility] C[facility]IAC RE[facility]IL[facility]ATION  Referring Provider Merlyn Albert MD       Initial Encounter Date:  Flowsheet Row PULMONARY REHAB OTHER RESP ORIENTATION from 05/12/2023 in [place] CARDIAC REHABILITATION  Date 05/12/23       Visit Diagnosis: Mild persistent asthma with acute exacerbation  Patient's Home Medications on Admission:   Current Outpatient Medications:    allopurinol (ZYLOPRIM) 100 MG tablet, Take 100 mg by mouth daily., Disp: , Rfl:    amLODipine-benazepril (LOTREL) 10-20 MG capsule, Take 1 capsule by mouth daily., Disp: , Rfl:    aspirin EC 81 MG tablet, Take 81 mg by mouth daily. Swallow whole., Disp: , Rfl:    atorvastatin (LIPITOR) 80 MG tablet, Take 80 mg by mouth daily., Disp: , Rfl:    baclofen (LIORESAL) 10 MG tablet, Take 10 mg by mouth 2 (two) times daily as needed., Disp: , Rfl:    BREO ELLIPTA 200-25 MCG/ACT AEPB, Inhale 1 puff into the lungs daily., Disp: , Rfl:    cetirizine (ZYRTEC) 10 MG tablet, Take 10 mg by mouth daily., Disp: , Rfl:    Cholecalciferol (VITAMIN D3) 50 MCG (2000 UT) TABS, Take 1 tablet by mouth daily., Disp: , Rfl:    Efgartigimod alfa-fcab (VYVGART IV), Inject into the vein every 30 (thirty) days., Disp: , Rfl:    famotidine (PEPCID) 20 MG tablet, Take 1 tablet by mouth 2 (two) times daily., Disp: , Rfl:    Misc. Devices MISC, by Does not apply route. CPAP, Disp: , Rfl:    Multiple Vitamin (MULTIVITAMIN WITH MINERALS) TABS tablet, Take 1 tablet by mouth daily., Disp: , Rfl:    mycophenolate (CELLCEPT) 500 MG tablet, Take 1,000 mg by mouth 2 (two) times daily., Disp: , Rfl:    nitroGLYCERIN (NITROSTAT) 0.4 MG SL tablet, Place 1 tablet (0.4 mg total) under the tongue every 5 (five) minutes x 3 doses as needed for chest  pain., Disp: 25 tablet, Rfl: 2   predniSONE (DELTASONE) 50 MG tablet, Take 50 mg by mouth daily., Disp: , Rfl:    pyridostigmine (MESTINON) 60 MG tablet, Take 30 mg by mouth 2 (two) times daily., Disp: , Rfl:    spironolactone (ALDACTONE) 25 MG tablet, Take 1 tablet (25 mg total) by mouth daily., Disp: 90 tablet, Rfl: 3  Past Medical History: Past Medical History:  Diagnosis Date   Asthma    Coronary artery disease    Gout    Hypercholesterolemia    Hypertension     Tobacco Use: Social History   Tobacco Use  Smoking Status Former   Current packs/day: 0.00   Average packs/day: 1 pack/day for 15.0 years (15.0 ttl pk-yrs)   Types: Cigarettes   Start date: 39   Quit date: 39   Years since quitting: 34.9   Passive exposure: Never  Smokeless Tobacco Never    Labs: Review Flowsheet       Latest Ref Rng & Units 12/19/2020 04/18/2021  Labs for ITP Cardiac and Pulmonary Rehab  Cholestrol 0 - 200 mg/dL 409  811   LDL (calc) 0 - 99 mg/dL 92  70   HDL-C >91 mg/dL 47  44   Trlycerides <478 mg/dL 295  62   Hemoglobin A2Z 4.8 - 5.6 % 5.5  -    Details  Capillary Blood Glucose: No results found for: "GLUCAP"   Pulmonary Assessment Scores:  Pulmonary Assessment Scores     Row Name 05/12/23 1622         ADL UCSD   ADL Phase Entry     SOB Score total 53     Rest 1     Walk 2     Stairs 4     Bath 4     Dress 4     Shop 2       CAT Score   CAT Score 23       mMRC Score   mMRC Score 1             UCSD: Self-administered rating of dyspnea associated with activities of daily living (ADLs) 6-point scale (0 = "not at all" to 5 = "maximal or unable to do because of breathlessness")  Scoring Scores range from 0 to 120.  Minimally important difference is 5 units  CAT: CAT can identify the health impairment of COPD patients and is better correlated with disease progression.  CAT has a scoring range of zero to 40. The CAT score is classified into  four groups of low (less than 10), medium (10 - 20), high (21-30) and very high (31-40) based on the impact level of disease on health status. A CAT score over 10 suggests significant symptoms.  A worsening CAT score could be explained by an exacerbation, poor medication adherence, poor inhaler technique, or progression of COPD or comorbid conditions.  CAT MCID is 2 points  mMRC: mMRC (Modified Medical Research Council) Dyspnea Scale is used to assess the degree of baseline functional disability in patients of respiratory disease due to dyspnea. No minimal important difference is established. A decrease in score of 1 point or greater is considered a positive change.   Pulmonary Function Assessment:  Pulmonary Function Assessment - 05/12/23 1622       Breath   Shortness of Breath Yes;Fear of Shortness of Breath;Limiting activity             Exercise Target Goals: Exercise Program Goal: Individual exercise prescription set using results from initial 6 min walk test and THRR while considering  patient's activity barriers and safety.   Exercise Prescription Goal: Initial exercise prescription builds to 30-45 minutes a day of aerobic activity, 2-3 days per week.  Home exercise guidelines will be given to patient during program as part of exercise prescription that the participant will acknowledge.  Activity Barriers & Risk Stratification:  Activity Barriers & Cardiac Risk Stratification - 05/12/23 1239       Activity Barriers & Cardiac Risk Stratification   Activity Barriers Deconditioning;Muscular Weakness;Shortness of Breath;Back Problems;Neck/Spine Problems;History of Falls;Other (comment)   buldging disc in low back   Comments Myasthenia Gravis (x2 yrs)             6 Minute Walk:  6 Minute Walk     Row Name 05/12/23 1608         6 Minute Walk   Phase Initial     Distance 1280 feet     Walk Time 6 minutes     # of Rest Breaks 0     MPH 2.42     METS 3.05     RPE  13     Perceived Dyspnea  2     VO2 Peak 10.67     Symptoms No     Resting HR 53 bpm     Resting  BP 132/74     Resting Oxygen Saturation  97 %     Exercise Oxygen Saturation  during 6 min walk 94 %     Max Ex. HR 122 bpm     Max Ex. BP 164/84     2 Minute Post BP 152/70       Interval HR   1 Minute HR 84     2 Minute HR 96     3 Minute HR 115     4 Minute HR 122     5 Minute HR 118     6 Minute HR 113     2 Minute Post HR 53     Interval Heart Rate? Yes       Interval Oxygen   Interval Oxygen? Yes     Baseline Oxygen Saturation % 97 %     1 Minute Oxygen Saturation % 94 %     1 Minute Liters of Oxygen 0 L  Room Air     2 Minute Oxygen Saturation % 94 %     2 Minute Liters of Oxygen 0 L     3 Minute Oxygen Saturation % 95 %     3 Minute Liters of Oxygen 0 L     4 Minute Oxygen Saturation % 95 %     4 Minute Liters of Oxygen 0 L     5 Minute Oxygen Saturation % 96 %     5 Minute Liters of Oxygen 0 L     6 Minute Oxygen Saturation % 96 %     6 Minute Liters of Oxygen 0 L     2 Minute Post Oxygen Saturation % 97 %     2 Minute Post Liters of Oxygen 0 L              Oxygen Initial Assessment:  Oxygen Initial Assessment - 05/12/23 1244       Home Oxygen   Home Oxygen Device None    Sleep Oxygen Prescription CPAP;None    Home Exercise Oxygen Prescription None    Home Resting Oxygen Prescription None    Compliance with Home Oxygen Use Yes      Initial 6 min Walk   Oxygen Used None      Program Oxygen Prescription   Program Oxygen Prescription None      Intervention   Short Term Goals To learn and exhibit compliance with exercise, home and travel O2 prescription;To learn and understand importance of monitoring SPO2 with pulse oximeter and demonstrate accurate use of the pulse oximeter.;To learn and understand importance of maintaining oxygen saturations>88%;To learn and demonstrate proper use of respiratory medications;To learn and demonstrate proper pursed lip  breathing techniques or other breathing techniques.     Long  Term Goals Exhibits compliance with exercise, home  and travel O2 prescription;Maintenance of O2 saturations>88%;Compliance with respiratory medication;Verbalizes importance of monitoring SPO2 with pulse oximeter and return demonstration;Exhibits proper breathing techniques, such as pursed lip breathing or other method taught during program session;Demonstrates proper use of MDI's             Oxygen Re-Evaluation:   Oxygen Discharge (Final Oxygen Re-Evaluation):   Initial Exercise Prescription:  Initial Exercise Prescription - 05/12/23 1600       Date of Initial Exercise RX and Referring Provider   Date 05/12/23    Referring Provider Merlyn Albert MD      Oxygen   Maintain Oxygen Saturation 88% or higher  Treadmill   MPH 2.3    Grade 0.5    Minutes 15    METs 2.92      REL-XR   Level 3    Speed 50    Minutes 15    METs 2.5      Prescription Details   Frequency (times per week) 2    Duration Progress to 30 minutes of continuous aerobic without signs/symptoms of physical distress      Intensity   THRR 40-80% of Max Heartrate 95-137    Ratings of Perceived Exertion 11-13    Perceived Dyspnea 0-4      Progression   Progression Continue to progress workloads to maintain intensity without signs/symptoms of physical distress.      Resistance Training   Training Prescription Yes    Weight 3 lb    Reps 10-15             Perform Capillary Blood Glucose checks as needed.  Exercise Prescription Changes:   Exercise Prescription Changes     Row Name 05/12/23 1600             Response to Exercise   Blood Pressure (Admit) 132/74       Blood Pressure (Exercise) 164/84       Blood Pressure (Exit) 142/72       Heart Rate (Admit) 53 bpm       Heart Rate (Exercise) 122 bpm       Heart Rate (Exit) 63 bpm       Oxygen Saturation (Admit) 97 %       Oxygen Saturation (Exercise) 94 %        Oxygen Saturation (Exit) 96 %       Rating of Perceived Exertion (Exercise) 13       Perceived Dyspnea (Exercise) 2       Symptoms SOB       Comments walk test results                Exercise Comments:   Exercise Goals and Review:   Exercise Goals     Row Name 05/12/23 1611             Exercise Goals   Increase Physical Activity Yes       Intervention Provide advice, education, support and counseling about physical activity/exercise needs.;Develop an individualized exercise prescription for aerobic and resistive training based on initial evaluation findings, risk stratification, comorbidities and participant's personal goals.       Expected Outcomes Short Term: Attend rehab on a regular basis to increase amount of physical activity.;Long Term: Add in home exercise to make exercise part of routine and to increase amount of physical activity.;Long Term: Exercising regularly at least 3-5 days a week.       Increase Strength and Stamina Yes       Intervention Provide advice, education, support and counseling about physical activity/exercise needs.;Develop an individualized exercise prescription for aerobic and resistive training based on initial evaluation findings, risk stratification, comorbidities and participant's personal goals.       Expected Outcomes Short Term: Increase workloads from initial exercise prescription for resistance, speed, and METs.;Short Term: Perform resistance training exercises routinely during rehab and add in resistance training at home;Long Term: Improve cardiorespiratory fitness, muscular endurance and strength as measured by increased METs and functional capacity ( )       Able to understand and use rate of perceived exertion (RPE) scale Yes       Intervention  Provide education and explanation on how to use RPE scale       Expected Outcomes Short Term: Able to use RPE daily in rehab to express subjective intensity level;Long Term:  Able to use RPE to  guide intensity level when exercising independently       Able to understand and use Dyspnea scale Yes       Intervention Provide education and explanation on how to use Dyspnea scale       Expected Outcomes Short Term: Able to use Dyspnea scale daily in rehab to express subjective sense of shortness of breath during exertion;Long Term: Able to use Dyspnea scale to guide intensity level when exercising independently       Knowledge and understanding of Target Heart Rate Range (THRR) Yes       Intervention Provide education and explanation of THRR including how the numbers were predicted and where they are located for reference       Expected Outcomes Short Term: Able to state/look up THRR;Long Term: Able to use THRR to govern intensity when exercising independently;Short Term: Able to use daily as guideline for intensity in rehab       Able to check pulse independently Yes       Intervention Review the importance of being able to check your own pulse for safety during independent exercise;Provide education and demonstration on how to check pulse in carotid and radial arteries.       Expected Outcomes Short Term: Able to explain why pulse checking is important during independent exercise;Long Term: Able to check pulse independently and accurately       Understanding of Exercise Prescription Yes       Intervention Provide education, explanation, and written materials on patient's individual exercise prescription       Expected Outcomes Short Term: Able to explain program exercise prescription;Long Term: Able to explain home exercise prescription to exercise independently                Exercise Goals Re-Evaluation :   Discharge Exercise Prescription (Final Exercise Prescription Changes):  Exercise Prescription Changes - 05/12/23 1600       Response to Exercise   Blood Pressure (Admit) 132/74    Blood Pressure (Exercise) 164/84    Blood Pressure (Exit) 142/72    Heart Rate (Admit) 53 bpm     Heart Rate (Exercise) 122 bpm    Heart Rate (Exit) 63 bpm    Oxygen Saturation (Admit) 97 %    Oxygen Saturation (Exercise) 94 %    Oxygen Saturation (Exit) 96 %    Rating of Perceived Exertion (Exercise) 13    Perceived Dyspnea (Exercise) 2    Symptoms SOB    Comments walk test results             Nutrition:  Target Goals: Understanding of nutrition guidelines, daily intake of sodium 1500mg , cholesterol 200mg , calories 30% from fat and 7% or less from saturated fats, daily to have 5 or more servings of fruits and vegetables.  Biometrics:  Pre Biometrics - 05/12/23 1612       Pre Biometrics   Height 5' 10.5" (1.791 m)    Weight 285 lb 9.6 oz (129.5 kg)    Waist Circumference 48 inches    Hip Circumference 45.5 inches    Waist to Hip Ratio 1.05 %    BMI (Calculated) 40.39    Grip Strength 47.3 kg    Single Leg Stand 23.8 seconds  Nutrition Therapy Plan and Nutrition Goals:  Nutrition Therapy & Goals - 05/12/23 1621       Intervention Plan   Intervention Prescribe, educate and counsel regarding individualized specific dietary modifications aiming towards targeted core components such as weight, hypertension, lipid management, diabetes, heart failure and other comorbidities.;Nutrition handout(s) given to patient.    Expected Outcomes Short Term Goal: Understand basic principles of dietary content, such as calories, fat, sodium, cholesterol and nutrients.;Long Term Goal: Adherence to prescribed nutrition plan.             Nutrition Assessments:  MEDIFICTS Score Key: >=70 Need to make dietary changes  40-70 Heart Healthy Diet <= 40 Therapeutic Level Cholesterol Diet  Flowsheet Row PULMONARY REHAB OTHER RESP ORIENTATION from 05/12/2023 in [facility]AC [facility]  Picture Your Plate Total Score on Admission 58      Picture Your Plate Scores: <16 Unhealthy dietary pattern with much room for improvement. 41-50 Dietary pattern  unlikely to meet recommendations for good health and room for improvement. 51-60 More healthful dietary pattern, with some room for improvement.  >60 Healthy dietary pattern, although there may be some specific behaviors that could be improved.    Nutrition Goals Re-Evaluation:   Nutrition Goals Discharge (Final Nutrition Goals Re-Evaluation):   Psychosocial: Target Goals: Acknowledge presence or absence of significant depression and/or stress, maximize coping skills, provide positive support system. Participant is able to verbalize types and ability to use techniques and skills needed for reducing stress and depression.  Initial Review & Psychosocial Screening:  Initial Psych Review & Screening - 05/12/23 1237       Initial Review   Current issues with History of Depression;Current Stress Concerns    Source of Stress Concerns Chronic Illness;Financial;Occupation;Unable to participate in former interests or hobbies;Unable to perform yard/household activities    Comments worried about future with myastenia and job, currently filing for disability, breathing keeping him from doing activities, uses CPAP at night usually 7 hrs at night      Family Dynamics   Good Support System? Yes   wife (nurse), 3 brothers, kids, neighbors     Barriers   Psychosocial barriers to participate in program Psychosocial barriers identified (see note);The patient should benefit from training in stress management and relaxation.      Screening Interventions   Interventions Encouraged to exercise;Provide feedback about the scores to participant;To provide support and resources with identified psychosocial needs    Expected Outcomes Short Term goal: Utilizing psychosocial counselor, staff and physician to assist with identification of specific Stressors or current issues interfering with healing process. Setting desired goal for each stressor or current issue identified.;Long Term Goal: Stressors or current  issues are controlled or eliminated.;Short Term goal: Identification and review with participant of any Quality of Life or Depression concerns found by scoring the questionnaire.;Long Term goal: The participant improves quality of Life and PHQ9 Scores as seen by post scores and/or verbalization of changes             Quality of Life Scores:  Scores of 19 and below usually indicate a poorer quality of life in these areas.  A difference of  2-3 points is a clinically meaningful difference.  A difference of 2-3 points in the total score of the Quality of Life Index has been associated with significant improvement in overall quality of life, self-image, physical symptoms, and general health in studies assessing change in quality of life.   PHQ-9: Review Flowsheet  05/12/2023  Depression screen PHQ 2/9  Decreased Interest 2  Down, Depressed, Hopeless 0  PHQ - 2 Score 2  Altered sleeping 2  Tired, decreased energy 2  Change in appetite 2  Feeling bad or failure about yourself  2  Trouble concentrating 1  Moving slowly or fidgety/restless 1  Suicidal thoughts 0  PHQ-9 Score 12  Difficult doing work/chores Somewhat difficult    Details           Interpretation of Total Score  Total Score Depression Severity:  1-4 = Minimal depression, 5-9 = Mild depression, 10-14 = Moderate depression, 15-19 = Moderately severe depression, 20-27 = Severe depression   Psychosocial Evaluation and Intervention:  Psychosocial Evaluation - 05/12/23 1612       Psychosocial Evaluation & Interventions   Interventions Stress management education;Relaxation education;Encouraged to exercise with the program and follow exercise prescription    Comments Moise Boring is coming into Pulmonary Rehab with asthma.  He had done rehab before after his heart attack.  He is worried about finances and affording gas to get here.  He is currently applying for disability but has yet to get any payment yet.  He has also  been out of work so no money has been coming in recently.  He is starting with 2 days a week to help with affordability.  If he has to go back to work, that will interfere with his scheduling as well.  He has a great support system in his wife, kids, 3 brothers, and neighbors.  He states that everyone keeps checking up on him to make sure he is good.  He sleeps well with his CPAP and is very consistent with it.  He usually sleeps about 6-7 hrs but recently only been getting 4-5 hrs from being up with prednisone again.  He has gained almost 60 lbs since starting prednisone in February and really would like to lose the weight again.  He is looking forward to improving his breathing and overall mobility, especially if he can get the weight back under better control.  Back in his 30s when this flared up the first time, he gained 80 lb and was able to get it back off.    Expected Outcomes Short: Attend rehab regularly to build up strength and breathing Long: Work on weight loss with exercise    Continue Psychosocial Services  Follow up required by staff             Psychosocial Re-Evaluation:   Psychosocial Discharge (Final Psychosocial Re-Evaluation):    Education: Education Goals: Education classes will be provided on a weekly basis, covering required topics. Participant will state understanding/return demonstration of topics presented.  Learning Barriers/Preferences:  Learning Barriers/Preferences - 05/12/23 1233       Learning Barriers/Preferences   Learning Barriers Sight   glasses for reading   Learning Preferences Skilled Demonstration             Education Topics: How Lungs Work and Diseases: - Discuss the anatomy of the lungs and diseases that can affect the lungs, such as COPD.   Exercise: -Discuss the importance of exercise, FITT principles of exercise, normal and abnormal responses to exercise, and how to exercise safely.   Environmental Irritants: -Discuss types of  environmental irritants and how to limit exposure to environmental irritants.   Meds/Inhalers and oxygen: - Discuss respiratory medications, definition of an inhaler and oxygen, and the proper way to use an inhaler and oxygen.   Energy Saving Techniques: -  Discuss methods to conserve energy and decrease shortness of breath when performing activities of daily living.    Bronchial Hygiene / Breathing Techniques: - Discuss breathing mechanics, pursed-lip breathing technique,  proper posture, effective ways to clear airways, and other functional breathing techniques   Cleaning Equipment: - Provides group verbal and written instruction about the health risks of elevated stress, cause of high stress, and healthy ways to reduce stress.   Nutrition I: Fats: - Discuss the types of cholesterol, what cholesterol does to the body, and how cholesterol levels can be controlled.   Nutrition II: Labels: -Discuss the different components of food labels and how to read food labels.   Respiratory Infections: - Discuss the signs and symptoms of respiratory infections, ways to prevent respiratory infections, and the importance of seeking medical treatment when having a respiratory infection.   Stress I: Signs and Symptoms: - Discuss the causes of stress, how stress may lead to anxiety and depression, and ways to limit stress.   Stress II: Relaxation: -Discuss relaxation techniques to limit stress.   Oxygen for Home/Travel: - Discuss how to prepare for travel when on oxygen and proper ways to transport and store oxygen to ensure safety.   Knowledge Questionnaire Score:  Knowledge Questionnaire Score - 05/12/23 1621       Knowledge Questionnaire Score   Pre Score 16/18             Core Components/Risk Factors/Patient Goals at Admission:  Personal Goals and Risk Factors at Admission - 05/12/23 1621       Core Components/Risk Factors/Patient Goals on Admission    Weight Management  Yes;Obesity;Weight Loss    Intervention Weight Management: Develop a combined nutrition and exercise program designed to reach desired caloric intake, while maintaining appropriate intake of nutrient and fiber, sodium and fats, and appropriate energy expenditure required for the weight goal.;Weight Management: Provide education and appropriate resources to help participant work on and attain dietary goals.;Weight Management/Obesity: Establish reasonable short term and long term weight goals.;Obesity: Provide education and appropriate resources to help participant work on and attain dietary goals.    Admit Weight 285 lb 9.6 oz (129.5 kg)    Goal Weight: Short Term 280 lb (127 kg)    Goal Weight: Long Term 275 lb (124.7 kg)    Expected Outcomes Short Term: Continue to assess and modify interventions until short term weight is achieved;Long Term: Adherence to nutrition and physical activity/exercise program aimed toward attainment of established weight goal;Weight Loss: Understanding of general recommendations for a balanced deficit meal plan, which promotes 1-2 lb weight loss per week and includes a negative energy balance of [phone] kcal/d;Understanding recommendations for meals to include 15-35% energy as protein, 25-35% energy from fat, 35-60% energy from carbohydrates, less than 200mg  of dietary cholesterol, 20-35 gm of total fiber daily;Understanding of distribution of calorie intake throughout the day with the consumption of 4-5 meals/snacks    Improve shortness of breath with ADL's Yes    Intervention Provide education, individualized exercise plan and daily activity instruction to help decrease symptoms of SOB with activities of daily living.    Expected Outcomes Short Term: Improve cardiorespiratory fitness to achieve a reduction of symptoms when performing ADLs;Long Term: Be able to perform more ADLs without symptoms or delay the onset of symptoms    Increase knowledge of respiratory medications  and ability to use respiratory devices properly  Yes    Intervention Provide education and demonstration as needed of appropriate use of medications, inhalers,  and oxygen therapy.    Expected Outcomes Short Term: Achieves understanding of medications use. Understands that oxygen is a medication prescribed by physician. Demonstrates appropriate use of inhaler and oxygen therapy.;Long Term: Maintain appropriate use of medications, inhalers, and oxygen therapy.    Hypertension Yes    Intervention Provide education on lifestyle modifcations including regular physical activity/exercise, weight management, moderate sodium restriction and increased consumption of fresh fruit, vegetables, and low fat dairy, alcohol moderation, and smoking cessation.;Monitor prescription use compliance.    Expected Outcomes Short Term: Continued assessment and intervention until BP is < 140/34mm HG in hypertensive participants. < 130/20mm HG in hypertensive participants with diabetes, heart failure or chronic kidney disease.;Long Term: Maintenance of blood pressure at goal levels.    Lipids Yes    Intervention Provide education and support for participant on nutrition & aerobic/resistive exercise along with prescribed medications to achieve LDL 70mg , HDL >40mg .    Expected Outcomes Short Term: Participant states understanding of desired cholesterol values and is compliant with medications prescribed. Participant is following exercise prescription and nutrition guidelines.;Long Term: Cholesterol controlled with medications as prescribed, with individualized exercise RX and with personalized nutrition plan. Value goals: LDL < 70mg , HDL > 40 mg.             Core Components/Risk Factors/Patient Goals Review:    Core Components/Risk Factors/Patient Goals at Discharge (Final Review):    ITP Comments:  ITP Comments     Row Name 05/12/23 1606 05/13/23 1504 05/14/23 1306       ITP Comments Patient attend orientation  today.  Patient is attending Pulmonary Rehabilitation Program.  Documentation for diagnosis can be found in [facility] media tab for Cardiac Rehab.  Reviewed medical chart, RPE/RPD, gym safety, and program guidelines.  Patient was fitted to equipment they will be using during rehab.  Patient is scheduled to start exercise on Tuesday 05/13/23 at 3pm.   Initial ITP created and sent for review and signature by Dr. Erick Blinks, Medical Director for Pulmonary Rehabilitation Program. irst full day of exercise!  Patient was oriented to gym and equipment including functions, settings, policies, and procedures.  Patient's individual exercise prescription and treatment plan were reviewed.  All starting workloads were established based on the results of the 6 minute walk test done at initial orientation visit.  The plan for exercise progression was also introduced and progression will be customized based on patient's performance and goals. 30 day review completed. ITP sent to Dr.Jehanzeb Memon, Medical Director of  Pulmonary Rehab. Continue with ITP unless changes are made by physician.  New to program only completed orientation and 1 exercise session thus far.              Comments: 30 day review

## 2023-05-15 ENCOUNTER — Encounter (HOSPITAL_COMMUNITY)
Admission: RE | Admit: 2023-05-15 | Discharge: 2023-05-15 | Disposition: A | Discharge status: Home / Self Care | Payer: BC Managed Care – PPO | Source: Ambulatory Visit | Attending: Pulmonary Disease | Admitting: Pulmonary Disease

## 2023-05-15 DIAGNOSIS — J4531 Mild persistent asthma with (acute) exacerbation: Secondary | ICD-10-CM

## 2023-05-15 NOTE — Progress Notes (Signed)
Daily Session Note  Patient Details  Name: Nicholas Andersen MRN: [id number] Date of Birth: 1960-11-19 Referring Provider:   Flowsheet Row PULMONARY REHAB OTHER RESP ORIENTATION from 05/12/2023 in [facility] CARDIAC REHABILITATION  Referring Provider Merlyn Albert MD       Encounter Date: 05/15/2023  Check In:  Session Check In - 05/15/23 1430       Check-In   Supervising physician immediately available to respond to emergencies See telemetry face sheet for immediately available ER MD    Location [facility]    Staff Present Rodena Medin, RN, BSN;Heather Fredric Mare, BS, Exercise Physiologist    Virtual Visit No    Medication changes reported     No    Fall or balance concerns reported    No    Warm-up and Cool-down Performed on first and last piece of equipment    Resistance Training Performed Yes    VAD Patient? No    PAD/SET Patient? No      Pain Assessment   Currently in Pain? No/denies    Multiple Pain Sites No             Capillary Blood Glucose: No results found for this or any previous visit (from the past 24 hour(s)).    Social History   Tobacco Use  Smoking Status Former   Current packs/day: 0.00   Average packs/day: 1 pack/day for 15.0 years (15.0 ttl pk-yrs)   Types: Cigarettes   Start date: 51   Quit date: 54   Years since quitting: 34.9   Passive exposure: Never  Smokeless Tobacco Never    Goals Met:  Proper associated with RPD/PD & O2 Sat Independence with exercise equipment Using PLB without cueing & demonstrates good technique Exercise tolerated well No report of concerns or symptoms today Strength training completed today  Goals Unmet:  Not Applicable  Comments: Pt able to follow exercise prescription today without complaint.  Will continue to monitor for progression.

## 2023-05-20 ENCOUNTER — Encounter (HOSPITAL_COMMUNITY)
Admission: RE | Admit: 2023-05-20 | Discharge: 2023-05-20 | Disposition: A | Discharge status: Home / Self Care | Payer: BC Managed Care – PPO | Source: Ambulatory Visit | Attending: Pulmonary Disease | Admitting: Pulmonary Disease

## 2023-05-20 DIAGNOSIS — J4531 Mild persistent asthma with (acute) exacerbation: Secondary | ICD-10-CM | POA: Diagnosis not present

## 2023-05-20 NOTE — Progress Notes (Signed)
Daily Session Note  Patient Details  Name: Nicholas Andersen MRN: [id number] Date of Birth: 09-11-1960 Referring Provider:   Flowsheet Row PULMONARY REHAB OTHER RESP ORIENTATION from 05/12/2023 in [facility] CARDIAC REHABILITATION  Referring Provider Merlyn Albert MD       Encounter Date: 05/20/2023  Check In:  Session Check In - 05/20/23 1440       Check-In   Supervising physician immediately available to respond to emergencies See telemetry face sheet for immediately available MD    Location [facility]    Staff Present Ross Ludwig, BS, Exercise Physiologist;Jessica Juanetta Gosling, MA, RCEP, CCRP, CCET;Myan Suit, RN    Virtual Visit No    Medication changes reported     No    Fall or balance concerns reported    No    Tobacco Cessation No Change    Warm-up and Cool-down Performed on first and last piece of equipment    Resistance Training Performed Yes    VAD Patient? No    PAD/SET Patient? No      Pain Assessment   Currently in Pain? No/denies    Multiple Pain Sites No             Capillary Blood Glucose: No results found for this or any previous visit (from the past 24 hour(s)).    Social History   Tobacco Use  Smoking Status Former   Current packs/day: 0.00   Average packs/day: 1 pack/day for 15.0 years (15.0 ttl pk-yrs)   Types: Cigarettes   Start date: 34   Quit date: 70   Years since quitting: 34.9   Passive exposure: Never  Smokeless Tobacco Never    Goals Met:  Proper associated with RPD/PD & O2 Sat Independence with exercise equipment Using PLB without cueing & demonstrates good technique Exercise tolerated well No report of concerns or symptoms today Strength training completed today  Goals Unmet:  Not Applicable  Comments: Pt able to follow exercise prescription today without complaint.  Will continue to monitor for progression.

## 2023-05-22 ENCOUNTER — Encounter (HOSPITAL_COMMUNITY)
Admission: RE | Admit: 2023-05-22 | Discharge: 2023-05-22 | Disposition: A | Discharge status: Home / Self Care | Payer: BC Managed Care – PPO | Source: Ambulatory Visit | Attending: Pulmonary Disease | Admitting: Pulmonary Disease

## 2023-05-22 DIAGNOSIS — J4531 Mild persistent asthma with (acute) exacerbation: Secondary | ICD-10-CM

## 2023-05-22 NOTE — Progress Notes (Signed)
Daily Session Note  Patient Details  Name: Nicholas Andersen MRN: [id number] Date of Birth: 22-Apr-1961 Referring Provider:   Flowsheet Row PULMONARY REHAB OTHER RESP ORIENTATION from 05/12/2023 in [facility] CARDIAC REHABILITATION  Referring Provider Merlyn Albert MD       Encounter Date: 05/22/2023  Check In:  Session Check In - 05/22/23 1430       Check-In   Supervising physician immediately available to respond to emergencies See telemetry face sheet for immediately available MD    Location [facility]    Staff Present Ross Ludwig, BS, Exercise Physiologist;Phyllis Billingsley, RN    Virtual Visit No    Medication changes reported     No    Fall or balance concerns reported    No    Tobacco Cessation No Change    Warm-up and Cool-down Performed on first and last piece of equipment    Resistance Training Performed Yes    VAD Patient? No    PAD/SET Patient? No      Pain Assessment   Currently in Pain? No/denies    Multiple Pain Sites No             Capillary Blood Glucose: No results found for this or any previous visit (from the past 24 hours).    Social History   Tobacco Use  Smoking Status Former   Current packs/day: 0.00   Average packs/day: 1 pack/day for 15.0 years (15.0 ttl pk-yrs)   Types: Cigarettes   Start date: 44   Quit date: 25   Years since quitting: 34.9   Passive exposure: Never  Smokeless Tobacco Never    Goals Met:  Independence with exercise equipment Exercise tolerated well No report of concerns or symptoms today Strength training completed today  Goals Unmet:  Not Applicable  Comments: Pt able to follow exercise prescription today without complaint.  Will continue to monitor for progression.

## 2023-05-27 ENCOUNTER — Encounter (HOSPITAL_COMMUNITY)
Admission: RE | Admit: 2023-05-27 | Discharge: 2023-05-27 | Disposition: A | Discharge status: Home / Self Care | Payer: BC Managed Care – PPO | Source: Ambulatory Visit | Attending: Pulmonary Disease | Admitting: Pulmonary Disease

## 2023-05-27 DIAGNOSIS — J4531 Mild persistent asthma with (acute) exacerbation: Secondary | ICD-10-CM

## 2023-05-27 NOTE — Progress Notes (Signed)
Daily Session Note  Patient Details  Name: Nicholas Andersen MRN: [id number] Date of Birth: 06-20-1960 Referring Provider:   Flowsheet Row PULMONARY REHAB OTHER RESP ORIENTATION from 05/12/2023 in [facility] CARDIAC REHABILITATION  Referring Provider Merlyn Albert MD       Encounter Date: 05/27/2023  Check In:  Session Check In - 05/27/23 1500       Check-In   Supervising physician immediately available to respond to emergencies See telemetry face sheet for immediately available MD    Location [facility]    Staff Present Ross Ludwig, BS, Exercise Physiologist;Phyllis Billingsley, RN    Virtual Visit No    Medication changes reported     No    Fall or balance concerns reported    No    Tobacco Cessation No Change    Warm-up and Cool-down Performed on first and last piece of equipment    Resistance Training Performed Yes    VAD Patient? No    PAD/SET Patient? No      Pain Assessment   Currently in Pain? No/denies    Multiple Pain Sites No             Capillary Blood Glucose: No results found for this or any previous visit (from the past 24 hours).    Social History   Tobacco Use  Smoking Status Former   Current packs/day: 0.00   Average packs/day: 1 pack/day for 15.0 years (15.0 ttl pk-yrs)   Types: Cigarettes   Start date: 2   Quit date: 35   Years since quitting: 34.9   Passive exposure: Never  Smokeless Tobacco Never    Goals Met:  Independence with exercise equipment Using PLB without cueing & demonstrates good technique Exercise tolerated well No report of concerns or symptoms today Strength training completed today  Goals Unmet:  Not Applicable  Comments: Pt able to follow exercise prescription today without complaint.  Will continue to monitor for progression.

## 2023-05-29 ENCOUNTER — Encounter (HOSPITAL_COMMUNITY)
Admission: RE | Admit: 2023-05-29 | Discharge: 2023-05-29 | Disposition: A | Discharge status: Home / Self Care | Payer: BC Managed Care – PPO | Source: Ambulatory Visit | Attending: Pulmonary Disease | Admitting: Pulmonary Disease

## 2023-05-29 DIAGNOSIS — J4531 Mild persistent asthma with (acute) exacerbation: Secondary | ICD-10-CM | POA: Diagnosis not present

## 2023-05-29 NOTE — Progress Notes (Signed)
Daily Session Note  Patient Details  Name: D'ARCY LEWIN MRN: [id number] Date of Birth: Sep 05, 1960 Referring Provider:   Flowsheet Row PULMONARY REHAB OTHER RESP ORIENTATION from 05/12/2023 in [facility] CARDIAC REHABILITATION  Referring Provider Merlyn Albert MD       Encounter Date: 05/29/2023  Check In:  Session Check In - 05/29/23 1500       Check-In   Supervising physician immediately available to respond to emergencies See telemetry face sheet for immediately available MD    Location [facility]    Staff Present Ross Ludwig, BS, Exercise Physiologist;Debra Laural Benes, RN, BSN;Other    Virtual Visit No    Medication changes reported     No    Fall or balance concerns reported    No    Tobacco Cessation No Change    Warm-up and Cool-down Performed on first and last piece of equipment    Resistance Training Performed Yes    VAD Patient? No    PAD/SET Patient? No      Pain Assessment   Currently in Pain? No/denies    Multiple Pain Sites No             Capillary Blood Glucose: No results found for this or any previous visit (from the past 24 hours).    Social History   Tobacco Use  Smoking Status Former   Current packs/day: 0.00   Average packs/day: 1 pack/day for 15.0 years (15.0 ttl pk-yrs)   Types: Cigarettes   Start date: 36   Quit date: 97   Years since quitting: 34.9   Passive exposure: Never  Smokeless Tobacco Never    Goals Met:  Independence with exercise equipment Exercise tolerated well No report of concerns or symptoms today Strength training completed today  Goals Unmet:  Not Applicable  Comments: Pt able to follow exercise prescription today without complaint.  Will continue to monitor for progression.

## 2023-05-30 ENCOUNTER — Ambulatory Visit: Payer: BC Managed Care – PPO | Attending: Nurse Practitioner | Admitting: Nurse Practitioner

## 2023-05-30 ENCOUNTER — Encounter: Payer: Self-pay | Admitting: Nurse Practitioner

## 2023-05-30 VITALS — BP 132/70 | HR 51 | Ht 70.0 in | Wt 285.2 lb

## 2023-05-30 DIAGNOSIS — R0602 Shortness of breath: Secondary | ICD-10-CM | POA: Diagnosis not present

## 2023-05-30 DIAGNOSIS — I10 Essential (primary) hypertension: Secondary | ICD-10-CM

## 2023-05-30 DIAGNOSIS — R011 Cardiac murmur, unspecified: Secondary | ICD-10-CM

## 2023-05-30 DIAGNOSIS — E785 Hyperlipidemia, unspecified: Secondary | ICD-10-CM

## 2023-05-30 DIAGNOSIS — I2510 Atherosclerotic heart disease of native coronary artery without angina pectoris: Secondary | ICD-10-CM | POA: Diagnosis not present

## 2023-05-30 NOTE — Progress Notes (Signed)
Cardiology Office Note:  .   Date:  05/30/2023 ID:  Gerre Pebbles, DOB 1960-11-26, MRN [id number] PCP: Normajean Glasgow, FNP  [facility] Providers Cardiologist:  Dina Rich, MD    History of Present Illness: .   Nicholas Andersen is a 62 y.o. male with a PMH of CAD, s/p NSTEMI in 2022, HTN, HLD, myasthenia gravis, OSA on CPAP, aortic sclerosis, and hx of sinus bradycardia, who presents today for scheduled follow-up.   Admitted in 2022 with NSTEMI. Cardiac cath revealed 50% mid LAD, 85% prox LAD, occluded mid LCX, prox RCA 60%. Underwent orbital arthrectomy and drug-eluting stent to LAD.  Has not been on beta-blocker due to bradycardia.  Brilinta was changed to Plavix due to fatigue.  Last seen by Dr. Dina Rich on August 28, 2022.  Due to severe myasthenia flare, was taking a higher dose of pyridostigmine, HR 41 bpm, asymptomatic, Dr. Wyline Mood recommended to monitor at the time.  Last seen for 102-month follow-up with his spouse on February 28, 2023. Stated he noticed his Myasthenia gravis more than others. Wife stated summer months tend to be challenging with his condition. He admitted to shortness of breath with exertion, not sure if this was due to his condition or not. Denied any chest pain, palpitations, syncope, presyncope, dizziness, orthopnea, PND, swelling or significant weight changes, acute bleeding, or claudication.  05/30/2023 - Today he presents for follow-up. Doing well. Pt is requesting to restart Wegovy as he lost over 40 lbs previously on this medication, believes weight loss would help improve his breathing. Denies any chest pain, palpitations, syncope, presyncope, dizziness, orthopnea, PND, swelling or significant weight changes, acute bleeding, or claudication. Says he is now participating regularly in pulmonary rehab.   SH: Works for [employer]. Wife is a Char[employer] fundraiser.   ROS: Negative. See HPI.   Studies Reviewed: .    Echo 03/2023: 1. Left ventricular  ejection fraction, by estimation, is 60 to 65%. The  left ventricle has normal function. Left ventricular endocardial border  not optimally defined to evaluate regional wall motion. There is mild left ventricular hypertrophy. Left ventricular diastolic function could not be evaluated.   2. Right ventricular systolic function was not well visualized. The right  ventricular size is normal. There is normal pulmonary artery systolic  pressure.   3. The mitral valve is grossly normal. No evidence of mitral valve  regurgitation. No evidence of mitral stenosis.   4. The aortic valve was not well visualized. Aortic valve regurgitation  is not visualized. Aortic valve sclerosis/calcification is present,  without any evidence of aortic stenosis. Aortic valve area, by VTI  measures 2.87 cm. Aortic valve mean gradient  measures 7.7 mmHg. Aortic valve Vmax measures 1.88 m/s.   5. Aortic dilatation noted. There is borderline dilatation of the aortic  root, measuring 38 mm. There is borderline dilatation of the ascending  aorta, measuring 38 mm.   6. The inferior vena cava is normal in size with greater than 50%  respiratory variability, suggesting right atrial pressure of 3 mmHg.   Comparison(s): No significant change from prior study.  Echo 04/2021:  1. Left ventricular ejection fraction, by estimation, is 60 to 65%. The  left ventricle has normal function. The left ventricle has no regional  wall motion abnormalities. There is mild left ventricular hypertrophy of the basal-septal segment. Left ventricular diastolic parameters were normal.   2. Right ventricular systolic function is normal. The right ventricular  size is normal. Tricuspid regurgitation signal  is inadequate for assessing PA pressure.   3. Left atrial size was mildly dilated.   4. The mitral valve is normal in structure. No evidence of mitral valve regurgitation. No evidence of mitral stenosis.   5. The aortic valve was not well  visualized. Aortic valve regurgitation  is not visualized. Mild to moderate aortic valve sclerosis/calcification  is present, without any evidence of aortic stenosis. Aortic valve area, by VTI measures 1.98 cm. Aortic valve mean gradient measures 9.0 mmHg. Aortic valve Vmax measures 2.01 m/s.   6. Aortic dilatation noted. There is mild dilatation of the aortic root,  measuring 38 mm.   7. The inferior vena cava is normal in size with greater than 50%  respiratory variability, suggesting right atrial pressure of 3 mmHg.  Coronary atherectomy 12/20/2020:  85% eccentric proximal calcified LAD Medina 1, 1, 1 bifurcation stenosis with a large diagonal was treated with orbital atherectomy after intravascular ultrasound interrogation. Following atherectomy a 4.0 x 28 Synergy stent was placed and postdilated to 5 mm in diameter with provisional bifurcation treatment plan.  Post stenting there was 0% stenosis, TIMI grade III flow, and 40 to 50% ostial narrowing of the diagonal which required no specific treatment.  There was TIMI-3 flow in the diagonal as well.     RECOMMENDATIONS:   Chest discomfort that occurred during the procedure was different than the presenting symptoms.  Perhaps this presentation was due to occlusion of the distal circumflex. Aggressive risk factor modification going forward. Sheath removal per protocol when ACT less than 175 seconds.  LHC 12/19/2020: Total occlusion of distal circumflex, possibly culprit for ACS/MI. Significant left coronary calcification. Proximal LAD bifurcation stenosis with Medina 110 heavily calcified bifurcation stenosis of 80%.  60% more distal in the mid LAD.  The diagonal at the proximal bifurcation Right coronary is dominant and there is mid eccentric 40 to 50% narrowing. Normal LVEDP LVEF by echo was normal   RECOMMENDATIONS:   Technically difficult procedure from right radial approach due to angulation at the subclavian/innominate artery and  innominate aortic bifurcation. Start Brilinta. Orbital atherectomy LAD with planned provisional stenting.  Physical Exam:   VS:  BP 132/70   Pulse (!) 51   Ht 5\' 10"  (1.778 m)   Wt 285 lb 3.2 oz (129.4 kg)   SpO2 95%   BMI 40.92 kg/m    Wt Readings from Last 3 Encounters:  05/30/23 285 lb 3.2 oz (129.4 kg)  05/12/23 285 lb 9.6 oz (129.5 kg)  02/28/23 282 lb (127.9 kg)    GEN: Morbidly obese, 62 y.o. male in no acute distress NECK: No JVD; No carotid bruits CARDIAC: S1/S2, RRR, grade 1/6 murmur, no rubs, no gallops RESPIRATORY:  Clear to auscultation without rales, wheezing or rhonchi  ABDOMEN: Soft, non-tender, non-distended EXTREMITIES:  No edema; No deformity   ASSESSMENT AND PLAN: .    CAD Hx of NSTEMI in 2022 - see cath report above. Denies any chest pain. Continue aspirin, atorvastatin, and NTG PRN. Not on beta blocker d/t past history of bradycardia. Heart healthy diet and regular cardiovascular exercise encouraged.   HLD Recent LDL 68. Continue atorvastatin. Heart healthy diet and regular cardiovascular exercise encouraged.   HTN BP stable. No medication changes at this time. Discussed to monitor BP at home at least 2 hours after medications and sitting for 5-10 minutes. Heart healthy diet and regular cardiovascular exercise encouraged.   DOE, murmur Etiology for DOE multifactorial. Recent Echo benign. Believe #5 is contributing to DOE.  Will route note to clinical pharm D for assistance with [facility]. Continue to follow with PCP. Care and ED precautions discussed.   5. Morbid obesity Weight loss via diet and exercise encouraged. Discussed the impact being overweight would have on cardiovascular risk. Will route note to clinical pharm D regarding Wegovy.   Dispo: Follow-up with Dr. Dina Rich or APP in 6 months or sooner if anything changes.   Signed, Sharlene Dory, NP

## 2023-05-30 NOTE — Patient Instructions (Addendum)

## 2023-06-03 ENCOUNTER — Encounter (HOSPITAL_COMMUNITY)
Admission: RE | Admit: 2023-06-03 | Discharge: 2023-06-03 | Disposition: A | Discharge status: Home / Self Care | Payer: BC Managed Care – PPO | Source: Ambulatory Visit | Attending: Pulmonary Disease | Admitting: Pulmonary Disease

## 2023-06-03 DIAGNOSIS — J4531 Mild persistent asthma with (acute) exacerbation: Secondary | ICD-10-CM

## 2023-06-03 NOTE — Progress Notes (Signed)
Daily Session Note  Patient Details  Name: PARVIN CRUZE MRN: [id number] Date of Birth: August 29, 1960 Referring Provider:   Flowsheet Row PULMONARY REHAB OTHER RESP ORIENTATION from 05/12/2023 in [facility] CARDIAC REHABILITATION  Referring Provider Merlyn Albert MD       Encounter Date: 06/03/2023  Check In:  Session Check In - 06/03/23 1030       Check-In   Supervising physician immediately available to respond to emergencies See telemetry face sheet for immediately available ER MD    Location [facility]    Staff Present Erskine Speed, RN;Jessica [place], MA, RCEP, CCRP, CCET    Virtual Visit No    Medication changes reported     No    Fall or balance concerns reported    No    Warm-up and Cool-down Performed on first and last piece of equipment    Resistance Training Performed Yes    VAD Patient? No    PAD/SET Patient? No      Pain Assessment   Currently in Pain? No/denies    Multiple Pain Sites No             Capillary Blood Glucose: No results found for this or any previous visit (from the past 24 hours).    Social History   Tobacco Use  Smoking Status Former   Current packs/day: 0.00   Average packs/day: 1 pack/day for 15.0 years (15.0 ttl pk-yrs)   Types: Cigarettes   Start date: 25   Quit date: 35   Years since quitting: 35.0   Passive exposure: Never  Smokeless Tobacco Never    Goals Met:  Proper associated with RPD/PD & O2 Sat Independence with exercise equipment Using PLB without cueing & demonstrates good technique Exercise tolerated well No report of concerns or symptoms today Strength training completed today  Goals Unmet:  Not Applicable  Comments: Pt able to follow exercise prescription today without complaint.  Will continue to monitor for progression.

## 2023-06-05 ENCOUNTER — Encounter (HOSPITAL_COMMUNITY): Payer: BC Managed Care – PPO

## 2023-06-10 ENCOUNTER — Encounter (HOSPITAL_COMMUNITY)
Admission: RE | Admit: 2023-06-10 | Discharge: 2023-06-10 | Disposition: A | Discharge status: Home / Self Care | Payer: BC Managed Care – PPO | Source: Ambulatory Visit | Attending: Pulmonary Disease | Admitting: Pulmonary Disease

## 2023-06-10 ENCOUNTER — Encounter (HOSPITAL_COMMUNITY): Payer: Self-pay | Admitting: *Deleted

## 2023-06-10 DIAGNOSIS — J4531 Mild persistent asthma with (acute) exacerbation: Secondary | ICD-10-CM | POA: Diagnosis not present

## 2023-06-10 NOTE — Progress Notes (Signed)
 Pulmonary Individual Treatment Plan  Patient Details  Name: Nicholas Andersen MRN: [id number] Date of Birth: 02-Jan-1961 Referring Provider:   Flowsheet Row PULMONARY REHAB OTHER RESP ORIENTATION from 05/12/2023 in [facility] CARDIAC REHABILITATION  Referring Provider Ronnette Cools MD       Initial Encounter Date:  Flowsheet Row PULMONARY REHAB OTHER RESP ORIENTATION from 05/12/2023 in [place] CARDIAC REHABILITATION  Date 05/12/23       Visit Diagnosis: Mild persistent asthma with acute exacerbation  Patient's Home Medications on Admission:   Current Outpatient Medications:    allopurinol  (ZYLOPRIM ) 100 MG tablet, Take 100 mg by mouth daily., Disp: , Rfl:    ALPRAZolam (XANAX) 1 MG tablet, 1 mg as needed., Disp: , Rfl:    amLODipine -benazepril  (LOTREL) 10-20 MG capsule, Take 1 capsule by mouth daily., Disp: , Rfl:    aspirin  EC 81 MG tablet, Take 81 mg by mouth daily. Swallow whole., Disp: , Rfl:    atorvastatin  (LIPITOR ) 80 MG tablet, Take 80 mg by mouth daily., Disp: , Rfl:    baclofen (LIORESAL) 10 MG tablet, Take 10 mg by mouth 2 (two) times daily as needed., Disp: , Rfl:    BREO ELLIPTA 200-25 MCG/ACT AEPB, Inhale 1 puff into the lungs daily., Disp: , Rfl:    cetirizine (ZYRTEC) 10 MG tablet, Take 10 mg by mouth as needed., Disp: , Rfl:    Cholecalciferol (VITAMIN D3) 50 MCG (2000 UT) TABS, Take 1 tablet by mouth daily., Disp: , Rfl:    Efgartigimod alfa-fcab (VYVGART IV), Inject into the vein every 30 (thirty) days., Disp: , Rfl:    famotidine (PEPCID) 20 MG tablet, Take 1 tablet by mouth 2 (two) times daily., Disp: , Rfl:    Misc. Devices MISC, by Does not apply route. CPAP, Disp: , Rfl:    Multiple Vitamin (MULTIVITAMIN WITH MINERALS) TABS tablet, Take 1 tablet by mouth daily., Disp: , Rfl:    mycophenolate (CELLCEPT) 500 MG tablet, Take 3 tabs by mouth every morning & 2 tabs every evening, Disp: , Rfl:    nitroGLYCERIN  (NITROSTAT ) 0.4 MG SL tablet, Place 1 tablet (0.4  mg total) under the tongue every 5 (five) minutes x 3 doses as needed for chest pain., Disp: 25 tablet, Rfl: 2   predniSONE (DELTASONE) 10 MG tablet, Take 10 mg by mouth daily., Disp: , Rfl:    pyridostigmine (MESTINON) 60 MG tablet, Take 30 mg by mouth 2 (two) times daily., Disp: , Rfl:    spironolactone  (ALDACTONE ) 25 MG tablet, Take 1 tablet (25 mg total) by mouth daily., Disp: 90 tablet, Rfl: 3  Past Medical History: Past Medical History:  Diagnosis Date   Asthma    Coronary artery disease    Gout    Hypercholesterolemia    Hypertension     Tobacco Use: Social History   Tobacco Use  Smoking Status Former   Current packs/day: 0.00   Average packs/day: 1 pack/day for 15.0 years (15.0 ttl pk-yrs)   Types: Cigarettes   Start date: 49   Quit date: 45   Years since quitting: 35.0   Passive exposure: Never  Smokeless Tobacco Never    Labs: Review Flowsheet       Latest Ref Rng & Units 12/19/2020 04/18/2021  Labs for ITP Cardiac and Pulmonary Rehab  Cholestrol 0 - 200 mg/dL 838  873   LDL (calc) 0 - 99 mg/dL 92  70   HDL-C >59 mg/dL 47  44   Trlycerides <849 mg/dL 889  62   Hemoglobin A1c 4.8 - 5.6 % 5.5  -    Capillary Blood Glucose: No results found for: GLUCAP   Pulmonary Assessment Scores:  Pulmonary Assessment Scores     Row Name 05/12/23 1622         ADL UCSD   ADL Phase Entry     SOB Score total 53     Rest 1     Walk 2     Stairs 4     Bath 4     Dress 4     Shop 2       CAT Score   CAT Score 23       mMRC Score   mMRC Score 1             UCSD: Self-administered rating of dyspnea associated with activities of daily living (ADLs) 6-point scale (0 = not at all to 5 = maximal or unable to do because of breathlessness)  Scoring Scores range from 0 to 120.  Minimally important difference is 5 units  CAT: CAT can identify the health impairment of COPD patients and is better correlated with disease progression.  CAT has a scoring  range of zero to 40. The CAT score is classified into four groups of low (less than 10), medium (10 - 20), high (21-30) and very high (31-40) based on the impact level of disease on health status. A CAT score over 10 suggests significant symptoms.  A worsening CAT score could be explained by an exacerbation, poor medication adherence, poor inhaler technique, or progression of COPD or comorbid conditions.  CAT MCID is 2 points  mMRC: mMRC (Modified Medical Research Council) Dyspnea Scale is used to assess the degree of baseline functional disability in patients of respiratory disease due to dyspnea. No minimal important difference is established. A decrease in score of 1 point or greater is considered a positive change.   Pulmonary Function Assessment:  Pulmonary Function Assessment - 05/12/23 1622       Breath   Shortness of Breath Yes;Fear of Shortness of Breath;Limiting activity             Exercise Target Goals: Exercise Program Goal: Individual exercise prescription set using results from initial 6 min walk test and THRR while considering  patient's activity barriers and safety.   Exercise Prescription Goal: Initial exercise prescription builds to 30-45 minutes a day of aerobic activity, 2-3 days per week.  Home exercise guidelines will be given to patient during program as part of exercise prescription that the participant will acknowledge.  Activity Barriers & Risk Stratification:  Activity Barriers & Cardiac Risk Stratification - 05/12/23 1239       Activity Barriers & Cardiac Risk Stratification   Activity Barriers Deconditioning;Muscular Weakness;Shortness of Breath;Back Problems;Neck/Spine Problems;History of Falls;Other (comment)   buldging disc in low back   Comments Myasthenia Gravis (x2 yrs)             6 Minute Walk:  6 Minute Walk     Row Name 05/12/23 1608         6 Minute Walk   Phase Initial     Distance 1280 feet     Walk Time 6 minutes     # of  Rest Breaks 0     MPH 2.42     METS 3.05     RPE 13     Perceived Dyspnea  2     VO2 Peak 10.67  Symptoms No     Resting HR 53 bpm     Resting BP 132/74     Resting Oxygen Saturation  97 %     Exercise Oxygen Saturation  during 6 min walk 94 %     Max Ex. HR 122 bpm     Max Ex. BP 164/84     2 Minute Post BP 152/70       Interval HR   1 Minute HR 84     2 Minute HR 96     3 Minute HR 115     4 Minute HR 122     5 Minute HR 118     6 Minute HR 113     2 Minute Post HR 53     Interval Heart Rate? Yes       Interval Oxygen   Interval Oxygen? Yes     Baseline Oxygen Saturation % 97 %     1 Minute Oxygen Saturation % 94 %     1 Minute Liters of Oxygen 0 L  Room Air     2 Minute Oxygen Saturation % 94 %     2 Minute Liters of Oxygen 0 L     3 Minute Oxygen Saturation % 95 %     3 Minute Liters of Oxygen 0 L     4 Minute Oxygen Saturation % 95 %     4 Minute Liters of Oxygen 0 L     5 Minute Oxygen Saturation % 96 %     5 Minute Liters of Oxygen 0 L     6 Minute Oxygen Saturation % 96 %     6 Minute Liters of Oxygen 0 L     2 Minute Post Oxygen Saturation % 97 %     2 Minute Post Liters of Oxygen 0 L              Oxygen Initial Assessment:  Oxygen Initial Assessment - 05/12/23 1244       Home Oxygen   Home Oxygen Device None    Sleep Oxygen Prescription CPAP;None    Home Exercise Oxygen Prescription None    Home Resting Oxygen Prescription None    Compliance with Home Oxygen Use Yes      Initial 6 min Walk   Oxygen Used None      Program Oxygen Prescription   Program Oxygen Prescription None      Intervention   Short Term Goals To learn and exhibit compliance with exercise, home and travel O2 prescription;To learn and understand importance of monitoring SPO2 with pulse oximeter and demonstrate accurate use of the pulse oximeter.;To learn and understand importance of maintaining oxygen saturations>88%;To learn and demonstrate proper use of respiratory  medications;To learn and demonstrate proper pursed lip breathing techniques or other breathing techniques.     Long  Term Goals Exhibits compliance with exercise, home  and travel O2 prescription;Maintenance of O2 saturations>88%;Compliance with respiratory medication;Verbalizes importance of monitoring SPO2 with pulse oximeter and return demonstration;Exhibits proper breathing techniques, such as pursed lip breathing or other method taught during program session;Demonstrates proper use of MDI's             Oxygen Re-Evaluation:  Oxygen Re-Evaluation     Row Name 06/03/23 1145             Program Oxygen Prescription   Program Oxygen Prescription None         Home Oxygen   Home Oxygen  Device None       Sleep Oxygen Prescription None;CPAP       Home Exercise Oxygen Prescription None       Home Resting Oxygen Prescription None       Compliance with Home Oxygen Use Yes         Goals/Expected Outcomes   Short Term Goals To learn and exhibit compliance with exercise, home and travel O2 prescription;To learn and understand importance of monitoring SPO2 with pulse oximeter and demonstrate accurate use of the pulse oximeter.;To learn and understand importance of maintaining oxygen saturations>88%;To learn and demonstrate proper use of respiratory medications;To learn and demonstrate proper pursed lip breathing techniques or other breathing techniques.        Long  Term Goals Exhibits compliance with exercise, home  and travel O2 prescription;Maintenance of O2 saturations>88%;Compliance with respiratory medication;Verbalizes importance of monitoring SPO2 with pulse oximeter and return demonstration;Exhibits proper breathing techniques, such as pursed lip breathing or other method taught during program session;Demonstrates proper use of MDI's       Comments Jenene is doing well in rehab.  He is compliant wiht his CPAP at night, but still doesn't sleep well.  His breathing is getting better and he  uses his inhalers as needed.  He is feeling better overall.       Goals/Expected Outcomes Short: Continue to use PLB Long: Continued to improve breathing.                Oxygen Discharge (Final Oxygen Re-Evaluation):  Oxygen Re-Evaluation - 06/03/23 1145       Program Oxygen Prescription   Program Oxygen Prescription None      Home Oxygen   Home Oxygen Device None    Sleep Oxygen Prescription None;CPAP    Home Exercise Oxygen Prescription None    Home Resting Oxygen Prescription None    Compliance with Home Oxygen Use Yes      Goals/Expected Outcomes   Short Term Goals To learn and exhibit compliance with exercise, home and travel O2 prescription;To learn and understand importance of monitoring SPO2 with pulse oximeter and demonstrate accurate use of the pulse oximeter.;To learn and understand importance of maintaining oxygen saturations>88%;To learn and demonstrate proper use of respiratory medications;To learn and demonstrate proper pursed lip breathing techniques or other breathing techniques.     Long  Term Goals Exhibits compliance with exercise, home  and travel O2 prescription;Maintenance of O2 saturations>88%;Compliance with respiratory medication;Verbalizes importance of monitoring SPO2 with pulse oximeter and return demonstration;Exhibits proper breathing techniques, such as pursed lip breathing or other method taught during program session;Demonstrates proper use of MDI's    Comments Jenene is doing well in rehab.  He is compliant wiht his CPAP at night, but still doesn't sleep well.  His breathing is getting better and he uses his inhalers as needed.  He is feeling better overall.    Goals/Expected Outcomes Short: Continue to use PLB Long: Continued to improve breathing.             Initial Exercise Prescription:  Initial Exercise Prescription - 05/12/23 1600       Date of Initial Exercise RX and Referring Provider   Date 05/12/23    Referring Provider Alghrim,  Paula MD      Oxygen   Maintain Oxygen Saturation 88% or higher      Treadmill   MPH 2.3    Grade 0.5    Minutes 15    METs 2.92  REL-XR   Level 3    Speed 50    Minutes 15    METs 2.5      Prescription Details   Frequency (times per week) 2    Duration Progress to 30 minutes of continuous aerobic without signs/symptoms of physical distress      Intensity   THRR 40-80% of Max Heartrate 95-137    Ratings of Perceived Exertion 11-13    Perceived Dyspnea 0-4      Progression   Progression Continue to progress workloads to maintain intensity without signs/symptoms of physical distress.      Resistance Training   Training Prescription Yes    Weight 3 lb    Reps 10-15             Perform Capillary Blood Glucose checks as needed.  Exercise Prescription Changes:   Exercise Prescription Changes     Row Name 05/12/23 1600 05/22/23 1500 06/03/23 1200         Response to Exercise   Blood Pressure (Admit) 132/74 120/68 122/65     Blood Pressure (Exercise) 164/84 162/68 --     Blood Pressure (Exit) 142/72 126/68 126/64     Heart Rate (Admit) 53 bpm 65 bpm 70 bpm     Heart Rate (Exercise) 122 bpm 110 bpm 110 bpm     Heart Rate (Exit) 63 bpm 85 bpm 85 bpm     Oxygen Saturation (Admit) 97 % 96 % 95 %     Oxygen Saturation (Exercise) 94 % 95 % 96 %     Oxygen Saturation (Exit) 96 % 97 % 97 %     Rating of Perceived Exertion (Exercise) 13 13 13      Perceived Dyspnea (Exercise) 2 3 2      Symptoms SOB sob --     Comments walk test results -- --     Duration -- Continue with 30 min of aerobic exercise without signs/symptoms of physical distress. Continue with 30 min of aerobic exercise without signs/symptoms of physical distress.     Intensity -- THRR unchanged THRR unchanged       Progression   Progression -- Continue to progress workloads to maintain intensity without signs/symptoms of physical distress. Continue to progress workloads to maintain intensity without  signs/symptoms of physical distress.       Resistance Training   Training Prescription -- Yes Yes     Weight -- 4 lbs 4 lbs     Reps -- 10-15 10-15       Treadmill   MPH -- 1.7 1.8     Grade -- 0 0     Minutes -- 15 15     METs -- 2.3 2.38       REL-XR   Level -- 4 6     Speed -- 60 58     Minutes -- 15 15     METs -- 2.9 5       Oxygen   Maintain Oxygen Saturation -- 88% or higher 88% or higher              Exercise Comments:   Exercise Goals and Review:   Exercise Goals     Row Name 05/12/23 1611             Exercise Goals   Increase Physical Activity Yes       Intervention Provide advice, education, support and counseling about physical activity/exercise needs.;Develop an individualized exercise prescription for aerobic and resistive training  based on initial evaluation findings, risk stratification, comorbidities and participant's personal goals.       Expected Outcomes Short Term: Attend rehab on a regular basis to increase amount of physical activity.;Long Term: Add in home exercise to make exercise part of routine and to increase amount of physical activity.;Long Term: Exercising regularly at least 3-5 days a week.       Increase Strength and Stamina Yes       Intervention Provide advice, education, support and counseling about physical activity/exercise needs.;Develop an individualized exercise prescription for aerobic and resistive training based on initial evaluation findings, risk stratification, comorbidities and participant's personal goals.       Expected Outcomes Short Term: Increase workloads from initial exercise prescription for resistance, speed, and METs.;Short Term: Perform resistance training exercises routinely during rehab and add in resistance training at home;Long Term: Improve cardiorespiratory fitness, muscular endurance and strength as measured by increased METs and functional capacity ( )       Able to understand and use rate of  perceived exertion (RPE) scale Yes       Intervention Provide education and explanation on how to use RPE scale       Expected Outcomes Short Term: Able to use RPE daily in rehab to express subjective intensity level;Long Term:  Able to use RPE to guide intensity level when exercising independently       Able to understand and use Dyspnea scale Yes       Intervention Provide education and explanation on how to use Dyspnea scale       Expected Outcomes Short Term: Able to use Dyspnea scale daily in rehab to express subjective sense of shortness of breath during exertion;Long Term: Able to use Dyspnea scale to guide intensity level when exercising independently       Knowledge and understanding of Target Heart Rate Range (THRR) Yes       Intervention Provide education and explanation of THRR including how the numbers were predicted and where they are located for reference       Expected Outcomes Short Term: Able to state/look up THRR;Long Term: Able to use THRR to govern intensity when exercising independently;Short Term: Able to use daily as guideline for intensity in rehab       Able to check pulse independently Yes       Intervention Review the importance of being able to check your own pulse for safety during independent exercise;Provide education and demonstration on how to check pulse in carotid and radial arteries.       Expected Outcomes Short Term: Able to explain why pulse checking is important during independent exercise;Long Term: Able to check pulse independently and accurately       Understanding of Exercise Prescription Yes       Intervention Provide education, explanation, and written materials on patient's individual exercise prescription       Expected Outcomes Short Term: Able to explain program exercise prescription;Long Term: Able to explain home exercise prescription to exercise independently                Exercise Goals Re-Evaluation :  Exercise Goals Re-Evaluation      Row Name 06/03/23 1114             Exercise Goal Re-Evaluation   Exercise Goals Review Increase Physical Activity;Increase Strength and Stamina;Understanding of Exercise Prescription       Comments Jenene is doing well in rehab.  He is not doing much at home yet.  We will review home exercise guidelines with him soon.  He is starting to feel like his stamina is improving and he is gettting his strength back.       Expected Outcomes Short; review home exercise Long: Conitnue to exercise to build stamina                Discharge Exercise Prescription (Final Exercise Prescription Changes):  Exercise Prescription Changes - 06/03/23 1200       Response to Exercise   Blood Pressure (Admit) 122/65    Blood Pressure (Exit) 126/64    Heart Rate (Admit) 70 bpm    Heart Rate (Exercise) 110 bpm    Heart Rate (Exit) 85 bpm    Oxygen Saturation (Admit) 95 %    Oxygen Saturation (Exercise) 96 %    Oxygen Saturation (Exit) 97 %    Rating of Perceived Exertion (Exercise) 13    Perceived Dyspnea (Exercise) 2    Duration Continue with 30 min of aerobic exercise without signs/symptoms of physical distress.    Intensity THRR unchanged      Progression   Progression Continue to progress workloads to maintain intensity without signs/symptoms of physical distress.      Resistance Training   Training Prescription Yes    Weight 4 lbs    Reps 10-15      Treadmill   MPH 1.8    Grade 0    Minutes 15    METs 2.38      REL-XR   Level 6    Speed 58    Minutes 15    METs 5      Oxygen   Maintain Oxygen Saturation 88% or higher             Nutrition:  Target Goals: Understanding of nutrition guidelines, daily intake of sodium 1500mg , cholesterol 200mg , calories 30% from fat and 7% or less from saturated fats, daily to have 5 or more servings of fruits and vegetables.  Biometrics:  Pre Biometrics - 05/12/23 1612       Pre Biometrics   Height 5' 10.5 (1.791 m)    Weight 285 lb  9.6 oz (129.5 kg)    Waist Circumference 48 inches    Hip Circumference 45.5 inches    Waist to Hip Ratio 1.05 %    BMI (Calculated) 40.39    Grip Strength 47.3 kg    Single Leg Stand 23.8 seconds              Nutrition Therapy Plan and Nutrition Goals:  Nutrition Therapy & Goals - 05/12/23 1621       Intervention Plan   Intervention Prescribe, educate and counsel regarding individualized specific dietary modifications aiming towards targeted core components such as weight, hypertension, lipid management, diabetes, heart failure and other comorbidities.;Nutrition handout(s) given to patient.    Expected Outcomes Short Term Goal: Understand basic principles of dietary content, such as calories, fat, sodium, cholesterol and nutrients.;Long Term Goal: Adherence to prescribed nutrition plan.             Nutrition Assessments:  MEDIFICTS Score Key: >=70 Need to make dietary changes  40-70 Heart Healthy Diet <= 40 Therapeutic Level Cholesterol Diet  Flowsheet Row PULMONARY REHAB OTHER RESP ORIENTATION from 05/12/2023 in [facility] CARDIAC REHABILITATION  Picture Your Plate Total Score on Admission 58      Picture Your Plate Scores: <59 Unhealthy dietary pattern with much room for improvement. 41-50 Dietary pattern unlikely to meet recommendations for  good health and room for improvement. 51-60 More healthful dietary pattern, with some room for improvement.  >60 Healthy dietary pattern, although there may be some specific behaviors that could be improved.    Nutrition Goals Re-Evaluation:  Nutrition Goals Re-Evaluation     Row Name 06/03/23 1142             Goals   Nutrition Goal Healthy Eating       Comment Jenene is doing well in rehab.  He has been working on his diet but steriods are making him eat and keeping his weight up.  He has been baking lots of pies and cookies for CHristmas.  He plans to get back to diet after holidays and enjoys cooking all his meals  at home.  He aims for a good variety.       Expected Outcome Short: Enjoy Christmas gooides Long: Focus on healthy variety                Nutrition Goals Discharge (Final Nutrition Goals Re-Evaluation):  Nutrition Goals Re-Evaluation - 06/03/23 1142       Goals   Nutrition Goal Healthy Eating    Comment Jenene is doing well in rehab.  He has been working on his diet but steriods are making him eat and keeping his weight up.  He has been baking lots of pies and cookies for CHristmas.  He plans to get back to diet after holidays and enjoys cooking all his meals at home.  He aims for a good variety.    Expected Outcome Short: Enjoy Christmas gooides Long: Focus on healthy variety             Psychosocial: Target Goals: Acknowledge presence or absence of significant depression and/or stress, maximize coping skills, provide positive support system. Participant is able to verbalize types and ability to use techniques and skills needed for reducing stress and depression.  Initial Review & Psychosocial Screening:  Initial Psych Review & Screening - 05/12/23 1237       Initial Review   Current issues with History of Depression;Current Stress Concerns    Source of Stress Concerns Chronic Illness;Financial;Occupation;Unable to participate in former interests or hobbies;Unable to perform yard/household activities    Comments worried about future with myastenia and job, currently filing for disability, breathing keeping him from doing activities, uses CPAP at night usually 7 hrs at night      Family Dynamics   Good Support System? Yes   wife (nurse), 3 brothers, kids, neighbors     Barriers   Psychosocial barriers to participate in program Psychosocial barriers identified (see note);The patient should benefit from training in stress management and relaxation.      Screening Interventions   Interventions Encouraged to exercise;Provide feedback about the scores to participant;To provide  support and resources with identified psychosocial needs    Expected Outcomes Short Term goal: Utilizing psychosocial counselor, staff and physician to assist with identification of specific Stressors or current issues interfering with healing process. Setting desired goal for each stressor or current issue identified.;Long Term Goal: Stressors or current issues are controlled or eliminated.;Short Term goal: Identification and review with participant of any Quality of Life or Depression concerns found by scoring the questionnaire.;Long Term goal: The participant improves quality of Life and PHQ9 Scores as seen by post scores and/or verbalization of changes             Quality of Life Scores:  Scores of 19 and below usually indicate  a poorer quality of life in these areas.  A difference of  2-3 points is a clinically meaningful difference.  A difference of 2-3 points in the total score of the Quality of Life Index has been associated with significant improvement in overall quality of life, self-image, physical symptoms, and general health in studies assessing change in quality of life.   PHQ-9: Review Flowsheet       05/12/2023  Depression screen PHQ 2/9  Decreased Interest 2  Down, Depressed, Hopeless 0  PHQ - 2 Score 2  Altered sleeping 2  Tired, decreased energy 2  Change in appetite 2  Feeling bad or failure about yourself  2  Trouble concentrating 1  Moving slowly or fidgety/restless 1  Suicidal thoughts 0  PHQ-9 Score 12  Difficult doing work/chores Somewhat difficult   Interpretation of Total Score  Total Score Depression Severity:  1-4 = Minimal depression, 5-9 = Mild depression, 10-14 = Moderate depression, 15-19 = Moderately severe depression, 20-27 = Severe depression   Psychosocial Evaluation and Intervention:  Psychosocial Evaluation - 05/12/23 1612       Psychosocial Evaluation & Interventions   Interventions Stress management education;Relaxation  education;Encouraged to exercise with the program and follow exercise prescription    Comments Jenene is coming into Pulmonary Rehab with asthma.  He had done rehab before after his heart attack.  He is worried about finances and affording gas to get here.  He is currently applying for disability but has yet to get any payment yet.  He has also been out of work so no money has been coming in recently.  He is starting with 2 days a week to help with affordability.  If he has to go back to work, that will interfere with his scheduling as well.  He has a great support system in his wife, kids, 3 brothers, and neighbors.  He states that everyone keeps checking up on him to make sure he is good.  He sleeps well with his CPAP and is very consistent with it.  He usually sleeps about 6-7 hrs but recently only been getting 4-5 hrs from being up with prednisone again.  He has gained almost 60 lbs since starting prednisone in February and really would like to lose the weight again.  He is looking forward to improving his breathing and overall mobility, especially if he can get the weight back under better control.  Back in his 30s when this flared up the first time, he gained 80 lb and was able to get it back off.    Expected Outcomes Short: Attend rehab regularly to build up strength and breathing Long: Work on weight loss with exercise    Continue Psychosocial Services  Follow up required by staff             Psychosocial Re-Evaluation:  Psychosocial Re-Evaluation     Row Name 06/03/23 1140             Psychosocial Re-Evaluation   Current issues with Current Sleep Concerns;Current Stress Concerns       Comments Jenene is doing well in rehab.  He rates his stress around medium due to his finances.  Since applying for disability he is not able to get paid as much and it is hard on him.  He also continues to have issues with sleep but did sleep well last night.  He is enjoying Christmas baking and  discovering his mom's recipes that had been forgotten.  He is  baking a lot currently and shares that love wiht his brothers.       Expected Outcomes Short: Enjoy Christmas baking Long; Conitnue to work on sleep       Interventions Encouraged to attend Pulmonary Rehabilitation for the exercise       Continue Psychosocial Services  Follow up required by staff                Psychosocial Discharge (Final Psychosocial Re-Evaluation):  Psychosocial Re-Evaluation - 06/03/23 1140       Psychosocial Re-Evaluation   Current issues with Current Sleep Concerns;Current Stress Concerns    Comments Jenene is doing well in rehab.  He rates his stress around medium due to his finances.  Since applying for disability he is not able to get paid as much and it is hard on him.  He also continues to have issues with sleep but did sleep well last night.  He is enjoying Christmas baking and discovering his mom's recipes that had been forgotten.  He is baking a lot currently and shares that love wiht his brothers.    Expected Outcomes Short: Enjoy Christmas baking Long; Conitnue to work on sleep    Interventions Encouraged to attend Pulmonary Rehabilitation for the exercise    Continue Psychosocial Services  Follow up required by staff              Education: Education Goals: Education classes will be provided on a weekly basis, covering required topics. Participant will state understanding/return demonstration of topics presented.  Learning Barriers/Preferences:  Learning Barriers/Preferences - 05/12/23 1233       Learning Barriers/Preferences   Learning Barriers Sight   glasses for reading   Learning Preferences Skilled Demonstration             Education Topics: How Lungs Work and Diseases: - Discuss the anatomy of the lungs and diseases that can affect the lungs, such as COPD.   Exercise: -Discuss the importance of exercise, FITT principles of exercise, normal and abnormal responses  to exercise, and how to exercise safely.   Environmental Irritants: -Discuss types of environmental irritants and how to limit exposure to environmental irritants.   Meds/Inhalers and oxygen: - Discuss respiratory medications, definition of an inhaler and oxygen, and the proper way to use an inhaler and oxygen.   Energy Saving Techniques: - Discuss methods to conserve energy and decrease shortness of breath when performing activities of daily living.    Bronchial Hygiene / Breathing Techniques: - Discuss breathing mechanics, pursed-lip breathing technique,  proper posture, effective ways to clear airways, and other functional breathing techniques   Cleaning Equipment: - Provides group verbal and written instruction about the health risks of elevated stress, cause of high stress, and healthy ways to reduce stress.   Nutrition I: Fats: - Discuss the types of cholesterol, what cholesterol does to the body, and how cholesterol levels can be controlled. Flowsheet Row PULMONARY REHAB OTHER RESPIRATORY from 05/29/2023 in [facility]  Date 05/29/23  Educator hb.  Instruction Review Code 1- Verbalizes Understanding       Nutrition II: Labels: -Discuss the different components of food labels and how to read food labels. Flowsheet Row PULMONARY REHAB OTHER RESPIRATORY from 05/29/2023 in [facility]  Date 05/29/23  Educator HB  Instruction Review Code 1- Verbalizes Understanding       Respiratory Infections: - Discuss the signs and symptoms of respiratory infections, ways to prevent respiratory infections, and the importance of  seeking medical treatment when having a respiratory infection.   Stress I: Signs and Symptoms: - Discuss the causes of stress, how stress may lead to anxiety and depression, and ways to limit stress.   Stress II: Relaxation: -Discuss relaxation techniques to limit stress.   Oxygen for Home/Travel: -  Discuss how to prepare for travel when on oxygen and proper ways to transport and store oxygen to ensure safety.   Knowledge Questionnaire Score:  Knowledge Questionnaire Score - 05/12/23 1621       Knowledge Questionnaire Score   Pre Score 16/18             Core Components/Risk Factors/Patient Goals at Admission:  Personal Goals and Risk Factors at Admission - 05/12/23 1621       Core Components/Risk Factors/Patient Goals on Admission    Weight Management Yes;Obesity;Weight Loss    Intervention Weight Management: Develop a combined nutrition and exercise program designed to reach desired caloric intake, while maintaining appropriate intake of nutrient and fiber, sodium and fats, and appropriate energy expenditure required for the weight goal.;Weight Management: Provide education and appropriate resources to help participant work on and attain dietary goals.;Weight Management/Obesity: Establish reasonable short term and long term weight goals.;Obesity: Provide education and appropriate resources to help participant work on and attain dietary goals.    Admit Weight 285 lb 9.6 oz (129.5 kg)    Goal Weight: Short Term 280 lb (127 kg)    Goal Weight: Long Term 275 lb (124.7 kg)    Expected Outcomes Short Term: Continue to assess and modify interventions until short term weight is achieved;Long Term: Adherence to nutrition and physical activity/exercise program aimed toward attainment of established weight goal;Weight Loss: Understanding of general recommendations for a balanced deficit meal plan, which promotes 1-2 lb weight loss per week and includes a negative energy balance of [phone] kcal/d;Understanding recommendations for meals to include 15-35% energy as protein, 25-35% energy from fat, 35-60% energy from carbohydrates, less than 200mg  of dietary cholesterol, 20-35 gm of total fiber daily;Understanding of distribution of calorie intake throughout the day with the consumption of 4-5  meals/snacks    Improve shortness of breath with ADL's Yes    Intervention Provide education, individualized exercise plan and daily activity instruction to help decrease symptoms of SOB with activities of daily living.    Expected Outcomes Short Term: Improve cardiorespiratory fitness to achieve a reduction of symptoms when performing ADLs;Long Term: Be able to perform more ADLs without symptoms or delay the onset of symptoms    Increase knowledge of respiratory medications and ability to use respiratory devices properly  Yes    Intervention Provide education and demonstration as needed of appropriate use of medications, inhalers, and oxygen therapy.    Expected Outcomes Short Term: Achieves understanding of medications use. Understands that oxygen is a medication prescribed by physician. Demonstrates appropriate use of inhaler and oxygen therapy.;Long Term: Maintain appropriate use of medications, inhalers, and oxygen therapy.    Hypertension Yes    Intervention Provide education on lifestyle modifcations including regular physical activity/exercise, weight management, moderate sodium restriction and increased consumption of fresh fruit, vegetables, and low fat dairy, alcohol moderation, and smoking cessation.;Monitor prescription use compliance.    Expected Outcomes Short Term: Continued assessment and intervention until BP is < 140/21mm HG in hypertensive participants. < 130/92mm HG in hypertensive participants with diabetes, heart failure or chronic kidney disease.;Long Term: Maintenance of blood pressure at goal levels.    Lipids Yes    Intervention Provide  education and support for participant on nutrition & aerobic/resistive exercise along with prescribed medications to achieve LDL 70mg , HDL >40mg .    Expected Outcomes Short Term: Participant states understanding of desired cholesterol values and is compliant with medications prescribed. Participant is following exercise prescription and  nutrition guidelines.;Long Term: Cholesterol controlled with medications as prescribed, with individualized exercise RX and with personalized nutrition plan. Value goals: LDL < 70mg , HDL > 40 mg.             Core Components/Risk Factors/Patient Goals Review:   Goals and Risk Factor Review     Row Name 06/03/23 1144             Core Components/Risk Factors/Patient Goals Review   Personal Goals Review Weight Management/Obesity;Improve shortness of breath with ADL's;Increase knowledge of respiratory medications and ability to use respiratory devices properly.;Hypertension       Review Donnie is doing well in rehab.  He still wants to lose weight but steriods are making it tough.  He had lost on wengovy previously but insurance stopped paying for it and he started steroirs and up his weight went again.  He is pressures are good and his breathing has been improving.  He has good days and bad days with breathing but more good since starting rehab.       Expected Outcomes Short: COnitnue to work on weight loss Long: Conitnue to monitor risk factors.                Core Components/Risk Factors/Patient Goals at Discharge (Final Review):   Goals and Risk Factor Review - 06/03/23 1144       Core Components/Risk Factors/Patient Goals Review   Personal Goals Review Weight Management/Obesity;Improve shortness of breath with ADL's;Increase knowledge of respiratory medications and ability to use respiratory devices properly.;Hypertension    Review Donnie is doing well in rehab.  He still wants to lose weight but steriods are making it tough.  He had lost on wengovy previously but insurance stopped paying for it and he started steroirs and up his weight went again.  He is pressures are good and his breathing has been improving.  He has good days and bad days with breathing but more good since starting rehab.    Expected Outcomes Short: COnitnue to work on weight loss Long: Conitnue to monitor risk  factors.             ITP Comments:  ITP Comments     Row Name 05/12/23 1606 05/13/23 1504 05/14/23 1306 06/10/23 1441     ITP Comments Patient attend orientation today.  Patient is attending Pulmonary Rehabilitation Program.  Documentation for diagnosis can be found in [facility] media tab for Cardiac Rehab.  Reviewed medical chart, RPE/RPD, gym safety, and program guidelines.  Patient was fitted to equipment they will be using during rehab.  Patient is scheduled to start exercise on Tuesday 05/13/23 at 3pm.   Initial ITP created and sent for review and signature by Dr. Anton Kelp, Medical Director for Pulmonary Rehabilitation Program. irst full day of exercise!  Patient was oriented to gym and equipment including functions, settings, policies, and procedures.  Patient's individual exercise prescription and treatment plan were reviewed.  All starting workloads were established based on the results of the 6 minute walk test done at initial orientation visit.  The plan for exercise progression was also introduced and progression will be customized based on patient's performance and goals. 30 day review completed. ITP sent to Dr.Jehanzeb Memon,  Medical Director of  Pulmonary Rehab. Continue with ITP unless changes are made by physician.  New to program only completed orientation and 1 exercise session thus far. 30 day review completed. ITP sent to Dr.Jehanzeb Memon, Medical Director of  Pulmonary Rehab. Continue with ITP unless changes are made by physician.             Comments: 30 day review

## 2023-06-10 NOTE — Progress Notes (Signed)
 Daily Session Note  Patient Details  Name: Nicholas Andersen MRN: [id number] Date of Birth: 1961-02-18 Referring Provider:   Flowsheet Row PULMONARY REHAB OTHER RESP ORIENTATION from 05/12/20[facility] in [facility]  Referring Provider Ronnette Cools MD       Encounter Date: 06/10/2023  Check In:  Session Check In - 06/10/23 1500       Check-In   Supervising physician immediately available to respond to emergencies See telemetry face sheet for immediately available MD    Location [facility]    Staff Present Powell Benders, BS, Exercise Physiologist;Danny Siri, RN, BSN    Virtual Visit No    Medication changes reported     No    Fall or balance concerns reported    No    Tobacco Cessation No Change    Warm-up and Cool-down Performed on first and last piece of equipment    Resistance Training Performed Yes    VAD Patient? No    PAD/SET Patient? No      Pain Assessment   Currently in Pain? No/denies    Multiple Pain Sites No             Capillary Blood Glucose: No results found for this or any previous visit (from the past 24 hours).    Social History   Tobacco Use  Smoking Status Former   Current packs/day: 0.00   Average packs/day: 1 pack/day for 15.0 years (15.0 ttl pk-yrs)   Types: Cigarettes   Start date: 75   Quit date: 27   Years since quitting: 35.0   Passive exposure: Never  Smokeless Tobacco Never    Goals Met:  Independence with exercise equipment Exercise tolerated well No report of concerns or symptoms today Strength training completed today  Goals Unmet:  Not Applicable  Comments: Pt able to follow exercise prescription today without complaint.  Will continue to monitor for progression.

## 2023-06-12 ENCOUNTER — Encounter (HOSPITAL_COMMUNITY)
Admission: RE | Admit: 2023-06-12 | Discharge: 2023-06-12 | Disposition: A | Discharge status: Home / Self Care | Payer: BC Managed Care – PPO | Source: Ambulatory Visit | Attending: Pulmonary Disease | Admitting: Pulmonary Disease

## 2023-06-12 DIAGNOSIS — J4531 Mild persistent asthma with (acute) exacerbation: Secondary | ICD-10-CM | POA: Insufficient documentation

## 2023-06-17 ENCOUNTER — Telehealth: Payer: Self-pay | Admitting: Pharmacy Technician

## 2023-06-17 ENCOUNTER — Encounter (HOSPITAL_COMMUNITY): Payer: BC Managed Care – PPO

## 2023-06-17 ENCOUNTER — Other Ambulatory Visit (HOSPITAL_COMMUNITY): Payer: Self-pay

## 2023-06-17 ENCOUNTER — Telehealth: Payer: Self-pay | Admitting: Nurse Practitioner

## 2023-06-17 MED ORDER — WEGOVY 0.25 MG/0.5ML ~~LOC~~ SOAJ: 0.2500 mg | SUBCUTANEOUS | 0 refills | Status: AC

## 2023-06-17 NOTE — Telephone Encounter (Signed)
 Patient informed and verbalized understanding of plan. New Rx sent to pharmacy and sent to Prior auth team

## 2023-06-17 NOTE — Telephone Encounter (Signed)
-----   Message from Cheree Ditto sent at 06/17/2023 10:51 AM EST -----  ----- Message ----- From: Sharen Hones Sent: 06/17/2023   9:36 AM EST To: [facility] Pharm[facility]  Please complete PA for wegovy if needed

## 2023-06-17 NOTE — Telephone Encounter (Signed)
-----   Message from Almarie Crate sent at 06/16/2023  4:39 PM EST ----- Please let patient know that I have spoken to clinical pharmacist who says pt can restart Wegovy  at 0.25mg  weekly x 4 weeks.   Please see if he has any personal or family history of medullary thyroid carcinoma or Multiple Endocrine Neoplasia syndrome type 2. If there is any indication of this, he cannot start it.   I do not see where he has any known hypersensitivity to Wegovy . Please see if his insurance will cover, may need PA, not sure.   Thank you!   Best,  Almarie Crate, NP

## 2023-06-17 NOTE — Telephone Encounter (Addendum)
 Pharmacy Patient Advocate Encounter   Received notification from Physician's Office that prior authorization for wegovy  is required/requested.   Insurance verification completed.   The patient is insured through [employer] .   Per test claim: PA required; PA submitted to above mentioned insurance via CoverMyMeds Key/confirmation #/EOC [id number] Status is pending

## 2023-06-18 ENCOUNTER — Other Ambulatory Visit (HOSPITAL_COMMUNITY): Payer: Self-pay

## 2023-06-19 ENCOUNTER — Encounter (HOSPITAL_COMMUNITY)
Admission: RE | Admit: 2023-06-19 | Discharge: 2023-06-19 | Disposition: A | Discharge status: Home / Self Care | Payer: BC Managed Care – PPO | Source: Ambulatory Visit | Attending: Pulmonary Disease | Admitting: Pulmonary Disease

## 2023-06-19 DIAGNOSIS — J4531 Mild persistent asthma with (acute) exacerbation: Secondary | ICD-10-CM

## 2023-06-19 NOTE — Progress Notes (Signed)
 Daily Session Note  Patient Details  Name: Nicholas Andersen MRN: [id number] Date of Birth: 1961/05/14 Referring Provider:   Flowsheet Row PULMONARY REHAB OTHER RESP ORIENTATION from 05/12/2023 in [facility]  Referring Provider Ronnette Cools MD       Encounter Date: 06/19/2023  Check In:  Session Check In - 06/19/23 1500       Check-In   Supervising physician immediately available to respond to emergencies See telemetry face sheet for immediately available MD    Location [facility]    Staff Present Powell Benders, BS, Exercise Physiologist;Jessica Vonzell, MA, RCEP, CCRP, CCET    Virtual Visit No    Medication changes reported     No    Fall or balance concerns reported    No    Tobacco Cessation No Change    Warm-up and Cool-down Performed on first and last piece of equipment    Resistance Training Performed Yes    VAD Patient? No    PAD/SET Patient? No      Pain Assessment   Currently in Pain? No/denies    Multiple Pain Sites No             Capillary Blood Glucose: No results found for this or any previous visit (from the past 24 hours).    Social History   Tobacco Use  Smoking Status Former   Current packs/day: 0.00   Average packs/day: 1 pack/day for 15.0 years (15.0 ttl pk-yrs)   Types: Cigarettes   Start date: 68   Quit date: 43   Years since quitting: 35.0   Passive exposure: Never  Smokeless Tobacco Never    Goals Met:  Independence with exercise equipment Using PLB without cueing & demonstrates good technique Exercise tolerated well No report of concerns or symptoms today Strength training completed today  Goals Unmet:  Not Applicable  Comments: Pt able to follow exercise prescription today without complaint.  Will continue to monitor for progression.

## 2023-06-20 ENCOUNTER — Other Ambulatory Visit (HOSPITAL_COMMUNITY): Payer: Self-pay

## 2023-06-23 ENCOUNTER — Encounter (HOSPITAL_COMMUNITY)
Admission: RE | Admit: 2023-06-23 | Discharge: 2023-06-23 | Disposition: A | Discharge status: Home / Self Care | Payer: BC Managed Care – PPO | Source: Ambulatory Visit | Attending: Pulmonary Disease | Admitting: Pulmonary Disease

## 2023-06-23 ENCOUNTER — Other Ambulatory Visit (HOSPITAL_COMMUNITY): Payer: Self-pay

## 2023-06-23 DIAGNOSIS — J4531 Mild persistent asthma with (acute) exacerbation: Secondary | ICD-10-CM | POA: Diagnosis not present

## 2023-06-23 NOTE — Progress Notes (Addendum)
 Daily Session Note  Patient Details  Name: Nicholas Andersen MRN: [id number] Date of Birth: 09/19/1960 Referring Provider:   Flowsheet Row PULMONARY REHAB OTHER RESP ORIENTATION from 05/12/2023 in [facility]  Referring Provider Ronnette Cools MD       Encounter Date: 06/23/2023  Check In:  Session Check In - 06/23/23 1102       Check-In   Supervising physician immediately available to respond to emergencies See telemetry face sheet for immediately available MD    Location [facility]    Staff Present Powell Benders, BS, Exercise Physiologist;Priscille Shadduck [place], MA, RCEP, CCRP, Sueellen Louder, RN, BSN    Virtual Visit No    Medication changes reported     No    Fall or balance concerns reported    No    Warm-up and Cool-down Performed on first and last piece of equipment    Resistance Training Performed Yes    VAD Patient? No    PAD/SET Patient? No      Pain Assessment   Currently in Pain? No/denies             Capillary Blood Glucose: No results found for this or any previous visit (from the past 24 hours).    Social History   Tobacco Use  Smoking Status Former   Current packs/day: 0.00   Average packs/day: 1 pack/day for 15.0 years (15.0 ttl pk-yrs)   Types: Cigarettes   Start date: 89   Quit date: 20   Years since quitting: 35.0   Passive exposure: Never  Smokeless Tobacco Never    Goals Met:  Proper associated with RPD/PD & O2 Sat Independence with exercise equipment Using PLB without cueing & demonstrates good technique Personal goals reviewed No report of concerns or symptoms today Strength training completed today  Goals Unmet:  Not Applicable  Comments: Pt able to follow exercise prescription today without complaint.  Will continue to monitor for progression.   Reviewed home exercise and goals  with pt today from 1107 to 1123.  Pt plans to walk at home with dog and join brother at mall to walk for  exercise.  Reviewed THR, pulse, RPE, sign and symptoms, pulse oximetery and when to call 911 or MD.  Also discussed weather considerations and indoor options.  Pt voiced understanding.

## 2023-06-24 ENCOUNTER — Other Ambulatory Visit (HOSPITAL_COMMUNITY): Payer: Self-pay

## 2023-06-24 ENCOUNTER — Encounter (HOSPITAL_COMMUNITY): Payer: BC Managed Care – PPO

## 2023-06-26 ENCOUNTER — Encounter (HOSPITAL_COMMUNITY): Payer: BC Managed Care – PPO

## 2023-06-30 ENCOUNTER — Encounter (HOSPITAL_COMMUNITY)
Admission: RE | Admit: 2023-06-30 | Discharge: 2023-06-30 | Disposition: A | Discharge status: Home / Self Care | Payer: BC Managed Care – PPO | Source: Ambulatory Visit | Attending: Pulmonary Disease | Admitting: Pulmonary Disease

## 2023-06-30 ENCOUNTER — Other Ambulatory Visit (HOSPITAL_COMMUNITY): Payer: Self-pay

## 2023-06-30 DIAGNOSIS — J4531 Mild persistent asthma with (acute) exacerbation: Secondary | ICD-10-CM

## 2023-06-30 NOTE — Progress Notes (Signed)
Daily Session Note  Patient Details  Name: Nicholas Andersen MRN: [id number] Date of Birth: 08/04/1960 Referring Provider:   Flowsheet Row PULMONARY REHAB OTHER RESP ORIENTATION from 05/12/2023 in [facility]  Referring Provider Merlyn Albert MD       Encounter Date: 06/30/2023  Check In:  Session Check In - 06/30/23 0929       Check-In   Supervising physician immediately available to respond to emergencies See telemetry face sheet for immediately available MD    Location [facility]    Staff Present Sherrye Payor, RN;Nealy Hickmon Fredric Mare, [place], Exercise Physiologist    Virtual Visit No    Medication changes reported     No    Fall or balance concerns reported    No    Tobacco Cessation No Change    Warm-up and Cool-down Performed on first and last piece of equipment    Resistance Training Performed Yes    VAD Patient? No    PAD/SET Patient? No      Pain Assessment   Currently in Pain? No/denies    Multiple Pain Sites No             Capillary Blood Glucose: No results found for this or any previous visit (from the past 24 hours).    Social History   Tobacco Use  Smoking Status Former   Current packs/day: 0.00   Average packs/day: 1 pack/day for 15.0 years (15.0 ttl pk-yrs)   Types: Cigarettes   Start date: 14   Quit date: 53   Years since quitting: 35.0   Passive exposure: Never  Smokeless Tobacco Never    Goals Met:  Independence with exercise equipment Using PLB without cueing & demonstrates good technique Exercise tolerated well No report of concerns or symptoms today Strength training completed today  Goals Unmet:  Not Applicable  Comments: Pt able to follow exercise prescription today without complaint.  Will continue to monitor for progression.

## 2023-07-01 ENCOUNTER — Encounter (HOSPITAL_COMMUNITY): Payer: BC Managed Care – PPO

## 2023-07-01 ENCOUNTER — Other Ambulatory Visit (HOSPITAL_COMMUNITY): Payer: Self-pay

## 2023-07-01 NOTE — Telephone Encounter (Signed)
Faxed over additional chart note to [facility]

## 2023-07-01 NOTE — Telephone Encounter (Signed)
Received fax that [employer] does not handle PA requests for this patient. In media tab

## 2023-07-02 ENCOUNTER — Other Ambulatory Visit (HOSPITAL_COMMUNITY): Payer: Self-pay

## 2023-07-02 NOTE — Telephone Encounter (Signed)
Pharmacy Patient Advocate Encounter  Received notification from  [facility]  that Prior Authorization for wegovy has been DENIED.  Full denial letter will be uploaded to the media tab. See denial reason below.   PA #/Case ID/Reference #: [id number]

## 2023-07-03 ENCOUNTER — Encounter (HOSPITAL_COMMUNITY)
Admission: RE | Admit: 2023-07-03 | Discharge: 2023-07-03 | Disposition: A | Discharge status: Home / Self Care | Payer: BC Managed Care – PPO | Source: Ambulatory Visit | Attending: Pulmonary Disease | Admitting: Pulmonary Disease

## 2023-07-03 DIAGNOSIS — J4531 Mild persistent asthma with (acute) exacerbation: Secondary | ICD-10-CM | POA: Diagnosis not present

## 2023-07-03 NOTE — Progress Notes (Signed)
Daily Session Note  Patient Details  Name: Nicholas Andersen MRN: [id number] Date of Birth: 18-Aug-1960 Referring Provider:   Flowsheet Row PULMONARY REHAB OTHER RESP ORIENTATION from 05/12/2023 in [facility] CARDIAC REHABILITATION  Referring Provider Merlyn Albert MD       Encounter Date: 07/03/2023  Check In:  Session Check In - 07/03/23 1315       Check-In   Supervising physician immediately available to respond to emergencies See telemetry face sheet for immediately available ER MD    Location [facility]    Staff Present Sherrye Payor, RN;Heather Fredric Mare, BS, Exercise Physiologist;Roniqua Kintz Laural Benes, RN, BSN    Virtual Visit No    Medication changes reported     No    Fall or balance concerns reported    No    Warm-up and Cool-down Performed on first and last piece of equipment    Resistance Training Performed Yes    VAD Patient? No    PAD/SET Patient? No      Pain Assessment   Currently in Pain? No/denies    Multiple Pain Sites No             Capillary Blood Glucose: No results found for this or any previous visit (from the past 24 hours).    Social History   Tobacco Use  Smoking Status Former   Current packs/day: 0.00   Average packs/day: 1 pack/day for 15.0 years (15.0 ttl pk-yrs)   Types: Cigarettes   Start date: 74   Quit date: 58   Years since quitting: 35.0   Passive exposure: Never  Smokeless Tobacco Never    Goals Met:  Proper associated with RPD/PD & O2 Sat Independence with exercise equipment Using PLB without cueing & demonstrates good technique Exercise tolerated well No report of concerns or symptoms today Strength training completed today  Goals Unmet:  Not Applicable  Comments: Pt able to follow exercise prescription today without complaint.  Will continue to monitor for progression.

## 2023-07-03 NOTE — Addendum Note (Signed)
Addended by: Sharen Hones on: 07/03/2023 02:04 PM   Modules accepted: Orders

## 2023-07-03 NOTE — Telephone Encounter (Signed)
Patient informed and verbalized understanding of plan. Referral has been sent

## 2023-07-07 ENCOUNTER — Encounter (HOSPITAL_COMMUNITY)
Admission: RE | Admit: 2023-07-07 | Discharge: 2023-07-07 | Disposition: A | Discharge status: Home / Self Care | Payer: BC Managed Care – PPO | Source: Ambulatory Visit | Attending: Pulmonary Disease | Admitting: Pulmonary Disease

## 2023-07-07 DIAGNOSIS — J4531 Mild persistent asthma with (acute) exacerbation: Secondary | ICD-10-CM | POA: Diagnosis not present

## 2023-07-07 NOTE — Progress Notes (Signed)
Daily Session Note  Patient Details  Name: Nicholas Andersen MRN: [id number] Date of Birth: 11-Nov-1960 Referring Provider:   Flowsheet Row PULMONARY REHAB OTHER RESP ORIENTATION from 05/12/2023 in [facility] CARDIAC REHABILITATION  Referring Provider Merlyn Albert MD       Encounter Date: 07/07/2023  Check In:  Session Check In - 07/07/23 0915       Check-In   Supervising physician immediately available to respond to emergencies See telemetry face sheet for immediately available MD    Location [facility]    Staff Present Ross Ludwig, BS, Exercise Physiologist;Brooke Elwyn Reach, RN    Virtual Visit No    Medication changes reported     No    Fall or balance concerns reported    No    Tobacco Cessation No Change    Warm-up and Cool-down Performed on first and last piece of equipment    Resistance Training Performed Yes    VAD Patient? No    PAD/SET Patient? No      Pain Assessment   Currently in Pain? No/denies    Multiple Pain Sites No             Capillary Blood Glucose: No results found for this or any previous visit (from the past 24 hours).    Social History   Tobacco Use  Smoking Status Former   Current packs/day: 0.00   Average packs/day: 1 pack/day for 15.0 years (15.0 ttl pk-yrs)   Types: Cigarettes   Start date: 10   Quit date: 51   Years since quitting: 35.0   Passive exposure: Never  Smokeless Tobacco Never    Goals Met:  Independence with exercise equipment Using PLB without cueing & demonstrates good technique Exercise tolerated well No report of concerns or symptoms today Strength training completed today  Goals Unmet:  Not Applicable  Comments: Pt able to follow exercise prescription today without complaint.  Will continue to monitor for progression.

## 2023-07-08 ENCOUNTER — Encounter (HOSPITAL_COMMUNITY): Payer: BC Managed Care – PPO

## 2023-07-09 ENCOUNTER — Encounter (HOSPITAL_COMMUNITY): Payer: Self-pay | Admitting: *Deleted

## 2023-07-09 DIAGNOSIS — J4531 Mild persistent asthma with (acute) exacerbation: Secondary | ICD-10-CM

## 2023-07-09 NOTE — Progress Notes (Signed)
Pulmonary Individual Treatment Plan  Patient Details  Name: Nicholas Andersen MRN: [id number] Date of Birth: June 07, 1961 Referring Provider:   Flowsheet Row PULMONARY REHAB OTHER RESP ORIENTATION from 05/12/2023 in [facility] CARDIAC REHABILITATION  Referring Provider Merlyn Albert MD       Initial Encounter Date:  Flowsheet Row PULMONARY REHAB OTHER RESP ORIENTATION from 05/12/2023 in [facility] CARDIAC REHABILITATION  Date 05/12/23       Visit Diagnosis: Mild persistent asthma with acute exacerbation  Patient's Home Medications on Admission:   Current Outpatient Medications:    allopurinol (ZYLOPRIM) 100 MG tablet, Take 100 mg by mouth daily., Disp: , Rfl:    ALPRAZolam (XANAX) 1 MG tablet, 1 mg as needed., Disp: , Rfl:    amLODipine-benazepril (LOTREL) 10-20 MG capsule, Take 1 capsule by mouth daily., Disp: , Rfl:    aspirin EC 81 MG tablet, Take 81 mg by mouth daily. Swallow whole., Disp: , Rfl:    atorvastatin (LIPITOR) 80 MG tablet, Take 80 mg by mouth daily., Disp: , Rfl:    baclofen (LIORESAL) 10 MG tablet, Take 10 mg by mouth 2 (two) times daily as needed., Disp: , Rfl:    BREO ELLIPTA 200-25 MCG/ACT AEPB, Inhale 1 puff into the lungs daily., Disp: , Rfl:    cetirizine (ZYRTEC) 10 MG tablet, Take 10 mg by mouth as needed., Disp: , Rfl:    Cholecalciferol (VITAMIN D3) 50 MCG (2000 UT) TABS, Take 1 tablet by mouth daily., Disp: , Rfl:    Efgartigimod alfa-fcab (VYVGART IV), Inject into the vein every 30 (thirty) days., Disp: , Rfl:    famotidine (PEPCID) 20 MG tablet, Take 1 tablet by mouth 2 (two) times daily., Disp: , Rfl:    Misc. Devices MISC, by Does not apply route. CPAP, Disp: , Rfl:    Multiple Vitamin (MULTIVITAMIN WITH MINERALS) TABS tablet, Take 1 tablet by mouth daily., Disp: , Rfl:    mycophenolate (CELLCEPT) 500 MG tablet, Take 3 tabs by mouth every morning & 2 tabs every evening, Disp: , Rfl:    nitroGLYCERIN (NITROSTAT) 0.4 MG SL tablet, Place 1 tablet (0.4  mg total) under the tongue every 5 (five) minutes x 3 doses as needed for chest pain., Disp: 25 tablet, Rfl: 2   predniSONE (DELTASONE) 10 MG tablet, Take 10 mg by mouth daily., Disp: , Rfl:    pyridostigmine (MESTINON) 60 MG tablet, Take 30 mg by mouth 2 (two) times daily., Disp: , Rfl:    Semaglutide-Weight Management (WEGOVY) 0.25 MG/0.5ML SOAJ, Inject 0.25 mg into the skin once a week., Disp: 2 mL, Rfl: 0   spironolactone (ALDACTONE) 25 MG tablet, Take 1 tablet (25 mg total) by mouth daily., Disp: 90 tablet, Rfl: 3  Past Medical History: Past Medical History:  Diagnosis Date   Asthma    Coronary artery disease    Gout    Hypercholesterolemia    Hypertension     Tobacco Use: Social History   Tobacco Use  Smoking Status Former   Current packs/day: 0.00   Average packs/day: 1 pack/day for 15.0 years (15.0 ttl pk-yrs)   Types: Cigarettes   Start date: 55   Quit date: 42   Years since quitting: 35.1   Passive exposure: Never  Smokeless Tobacco Never    Labs: Review Flowsheet       Latest Ref Rng & Units 12/19/2020 04/18/2021  Labs for ITP Cardiac and Pulmonary Rehab  Cholestrol 0 - 200 mg/dL 086  578   LDL (  calc) 0 - 99 mg/dL 92  70   HDL-C >16 mg/dL 47  44   Trlycerides <109 mg/dL 604  62   Hemoglobin V4U 4.8 - 5.6 % 5.5  -    Capillary Blood Glucose: No results found for: "GLUCAP"   Pulmonary Assessment Scores:  Pulmonary Assessment Scores     Row Name 05/12/23 1622         ADL UCSD   ADL Phase Entry     SOB Score total 53     Rest 1     Walk 2     Stairs 4     Bath 4     Dress 4     Shop 2       CAT Score   CAT Score 23       mMRC Score   mMRC Score 1             UCSD: Self-administered rating of dyspnea associated with activities of daily living (ADLs) 6-point scale (0 = "not at all" to 5 = "maximal or unable to do because of breathlessness")  Scoring Scores range from 0 to 120.  Minimally important difference is 5 units  CAT: CAT  can identify the health impairment of COPD patients and is better correlated with disease progression.  CAT has a scoring range of zero to 40. The CAT score is classified into four groups of low (less than 10), medium (10 - 20), high (21-30) and very high (31-40) based on the impact level of disease on health status. A CAT score over 10 suggests significant symptoms.  A worsening CAT score could be explained by an exacerbation, poor medication adherence, poor inhaler technique, or progression of COPD or comorbid conditions.  CAT MCID is 2 points  mMRC: mMRC (Modified Medical Research Council) Dyspnea Scale is used to assess the degree of baseline functional disability in patients of respiratory disease due to dyspnea. No minimal important difference is established. A decrease in score of 1 point or greater is considered a positive change.   Pulmonary Function Assessment:  Pulmonary Function Assessment - 05/12/23 1622       Breath   Shortness of Breath Yes;Fear of Shortness of Breath;Limiting activity             Exercise Target Goals: Exercise Program Goal: Individual exercise prescription set using results from initial 6 min walk test and THRR while considering  patient's activity barriers and safety.   Exercise Prescription Goal: Initial exercise prescription builds to 30-45 minutes a day of aerobic activity, 2-3 days per week.  Home exercise guidelines will be given to patient during program as part of exercise prescription that the participant will acknowledge.  Activity Barriers & Risk Stratification:  Activity Barriers & Cardiac Risk Stratification - 05/12/23 1239       Activity Barriers & Cardiac Risk Stratification   Activity Barriers Deconditioning;Muscular Weakness;Shortness of Breath;Back Problems;Neck/Spine Problems;History of Falls;Other (comment)   buldging disc in low back   Comments Myasthenia Gravis (x2 yrs)             6 Minute Walk:  6 Minute Walk      Row Name 05/12/23 1608         6 Minute Walk   Phase Initial     Distance 1280 feet     Walk Time 6 minutes     # of Rest Breaks 0     MPH 2.42     METS 3.05  RPE 13     Perceived Dyspnea  2     VO2 Peak 10.67     Symptoms No     Resting HR 53 bpm     Resting BP 132/74     Resting Oxygen Saturation  97 %     Exercise Oxygen Saturation  during 6 min walk 94 %     Max Ex. HR 122 bpm     Max Ex. BP 164/84     2 Minute Post BP 152/70       Interval HR   1 Minute HR 84     2 Minute HR 96     3 Minute HR 115     4 Minute HR 122     5 Minute HR 118     6 Minute HR 113     2 Minute Post HR 53     Interval Heart Rate? Yes       Interval Oxygen   Interval Oxygen? Yes     Baseline Oxygen Saturation % 97 %     1 Minute Oxygen Saturation % 94 %     1 Minute Liters of Oxygen 0 L  Room Air     2 Minute Oxygen Saturation % 94 %     2 Minute Liters of Oxygen 0 L     3 Minute Oxygen Saturation % 95 %     3 Minute Liters of Oxygen 0 L     4 Minute Oxygen Saturation % 95 %     4 Minute Liters of Oxygen 0 L     5 Minute Oxygen Saturation % 96 %     5 Minute Liters of Oxygen 0 L     6 Minute Oxygen Saturation % 96 %     6 Minute Liters of Oxygen 0 L     2 Minute Post Oxygen Saturation % 97 %     2 Minute Post Liters of Oxygen 0 L              Oxygen Initial Assessment:  Oxygen Initial Assessment - 05/12/23 1244       Home Oxygen   Home Oxygen Device None    Sleep Oxygen Prescription CPAP;None    Home Exercise Oxygen Prescription None    Home Resting Oxygen Prescription None    Compliance with Home Oxygen Use Yes      Initial 6 min Walk   Oxygen Used None      Program Oxygen Prescription   Program Oxygen Prescription None      Intervention   Short Term Goals To learn and exhibit compliance with exercise, home and travel O2 prescription;To learn and understand importance of monitoring SPO2 with pulse oximeter and demonstrate accurate use of the pulse oximeter.;To  learn and understand importance of maintaining oxygen saturations>88%;To learn and demonstrate proper use of respiratory medications;To learn and demonstrate proper pursed lip breathing techniques or other breathing techniques.     Long  Term Goals Exhibits compliance with exercise, home  and travel O2 prescription;Maintenance of O2 saturations>88%;Compliance with respiratory medication;Verbalizes importance of monitoring SPO2 with pulse oximeter and return demonstration;Exhibits proper breathing techniques, such as pursed lip breathing or other method taught during program session;Demonstrates proper use of MDI's             Oxygen Re-Evaluation:  Oxygen Re-Evaluation     Row Name 06/03/23 1145 06/23/23 1119           Program Oxygen  Prescription   Program Oxygen Prescription None None        Home Oxygen   Home Oxygen Device None None      Sleep Oxygen Prescription None;CPAP None;CPAP      Home Exercise Oxygen Prescription None None      Home Resting Oxygen Prescription None None      Compliance with Home Oxygen Use Yes Yes        Goals/Expected Outcomes   Short Term Goals To learn and exhibit compliance with exercise, home and travel O2 prescription;To learn and understand importance of monitoring SPO2 with pulse oximeter and demonstrate accurate use of the pulse oximeter.;To learn and understand importance of maintaining oxygen saturations>88%;To learn and demonstrate proper use of respiratory medications;To learn and demonstrate proper pursed lip breathing techniques or other breathing techniques.  To learn and exhibit compliance with exercise, home and travel O2 prescription;To learn and understand importance of monitoring SPO2 with pulse oximeter and demonstrate accurate use of the pulse oximeter.;To learn and understand importance of maintaining oxygen saturations>88%;To learn and demonstrate proper use of respiratory medications;To learn and demonstrate proper pursed lip breathing  techniques or other breathing techniques.       Long  Term Goals Exhibits compliance with exercise, home  and travel O2 prescription;Maintenance of O2 saturations>88%;Compliance with respiratory medication;Verbalizes importance of monitoring SPO2 with pulse oximeter and return demonstration;Exhibits proper breathing techniques, such as pursed lip breathing or other method taught during program session;Demonstrates proper use of MDI's Exhibits compliance with exercise, home  and travel O2 prescription;Maintenance of O2 saturations>88%;Compliance with respiratory medication;Verbalizes importance of monitoring SPO2 with pulse oximeter and return demonstration;Exhibits proper breathing techniques, such as pursed lip breathing or other method taught during program session;Demonstrates proper use of MDI's      Comments Moise Boring is doing well in rehab.  He is compliant wiht his CPAP at night, but still doesn't sleep well.  His breathing is getting better and he uses his inhalers as needed.  He is feeling better overall. Donnie is doing well in rehab.  He is compliant with his CPAP and uses it each night.  He as been working on his breathing and it is getting better.  He continues to monitor his saturations at home and will take it with him to exercise.      Goals/Expected Outcomes Short: Continue to use PLB Long: Continued to improve breathing. Short: Continue to use PLB  long Continued compliance with CPAP               Oxygen Discharge (Final Oxygen Re-Evaluation):  Oxygen Re-Evaluation - 06/23/23 1119       Program Oxygen Prescription   Program Oxygen Prescription None      Home Oxygen   Home Oxygen Device None    Sleep Oxygen Prescription None;CPAP    Home Exercise Oxygen Prescription None    Home Resting Oxygen Prescription None    Compliance with Home Oxygen Use Yes      Goals/Expected Outcomes   Short Term Goals To learn and exhibit compliance with exercise, home and travel O2 prescription;To  learn and understand importance of monitoring SPO2 with pulse oximeter and demonstrate accurate use of the pulse oximeter.;To learn and understand importance of maintaining oxygen saturations>88%;To learn and demonstrate proper use of respiratory medications;To learn and demonstrate proper pursed lip breathing techniques or other breathing techniques.     Long  Term Goals Exhibits compliance with exercise, home  and travel O2 prescription;Maintenance of O2 saturations>88%;Compliance with respiratory  medication;Verbalizes importance of monitoring SPO2 with pulse oximeter and return demonstration;Exhibits proper breathing techniques, such as pursed lip breathing or other method taught during program session;Demonstrates proper use of MDI's    Comments Moise Boring is doing well in rehab.  He is compliant with his CPAP and uses it each night.  He as been working on his breathing and it is getting better.  He continues to monitor his saturations at home and will take it with him to exercise.    Goals/Expected Outcomes Short: Continue to use PLB  long Continued compliance with CPAP             Initial Exercise Prescription:  Initial Exercise Prescription - 05/12/23 1600       Date of Initial Exercise RX and Referring Provider   Date 05/12/23    Referring Provider Alghrim, Rondall Allegra MD      Oxygen   Maintain Oxygen Saturation 88% or higher      Treadmill   MPH 2.3    Grade 0.5    Minutes 15    METs 2.92      REL-XR   Level 3    Speed 50    Minutes 15    METs 2.5      Prescription Details   Frequency (times per week) 2    Duration Progress to 30 minutes of continuous aerobic without signs/symptoms of physical distress      Intensity   THRR 40-80% of Max Heartrate 95-137    Ratings of Perceived Exertion 11-13    Perceived Dyspnea 0-4      Progression   Progression Continue to progress workloads to maintain intensity without signs/symptoms of physical distress.      Resistance Training    Training Prescription Yes    Weight 3 lb    Reps 10-15             Perform Capillary Blood Glucose checks as needed.  Exercise Prescription Changes:   Exercise Prescription Changes     Row Name 05/12/23 1600 05/22/23 1500 06/03/23 1200 06/10/23 1500 06/23/23 1100     Response to Exercise   Blood Pressure (Admit) 132/74 120/68 122/65 126/72 --   Blood Pressure (Exercise) 164/84 162/68 -- 124/65 --   Blood Pressure (Exit) 142/72 126/68 126/64 132/78 --   Heart Rate (Admit) 53 bpm 65 bpm 70 bpm 84 bpm --   Heart Rate (Exercise) 122 bpm 110 bpm 110 bpm 95 bpm --   Heart Rate (Exit) 63 bpm 85 bpm 85 bpm 77 bpm --   Oxygen Saturation (Admit) 97 % 96 % 95 % 96 % --   Oxygen Saturation (Exercise) 94 % 95 % 96 % 94 % --   Oxygen Saturation (Exit) 96 % 97 % 97 % 94 % --   Rating of Perceived Exertion (Exercise) 13 13 13 13  --   Perceived Dyspnea (Exercise) 2 3 2 1  --   Symptoms SOB sob -- -- --   Comments walk test results -- -- -- --   Duration -- Continue with 30 min of aerobic exercise without signs/symptoms of physical distress. Continue with 30 min of aerobic exercise without signs/symptoms of physical distress. Continue with 30 min of aerobic exercise without signs/symptoms of physical distress. --   Intensity -- THRR unchanged THRR unchanged THRR unchanged --     Progression   Progression -- Continue to progress workloads to maintain intensity without signs/symptoms of physical distress. Continue to progress workloads to maintain intensity without signs/symptoms  of physical distress. Continue to progress workloads to maintain intensity without signs/symptoms of physical distress. --     Paramedic Prescription -- Yes Yes Yes --   Weight -- 4 lbs 4 lbs 4 lbs --   Reps -- 10-15 10-15 10-15 --     Treadmill   MPH -- 1.7 1.8 1.8 --   Grade -- 0 0 0 --   Minutes -- 15 15 15  --   METs -- 2.3 2.38 2.38 --     REL-XR   Level -- 4 6 5  --   Speed -- 60 58 66  --   Minutes -- 15 15 15  --   METs -- 2.9 5 4.5 --     Home Exercise Plan   Plans to continue exercise at -- -- -- -- Home (comment)  walking with dog and brother at mall   Frequency -- -- -- -- Add 3 additional days to program exercise sessions.   Initial Home Exercises Provided -- -- -- -- 06/23/23     Oxygen   Maintain Oxygen Saturation -- 88% or higher 88% or higher 88% or higher --    Row Name 06/23/23 1300 07/03/23 1500           Response to Exercise   Blood Pressure (Admit) 136/62 102/60      Blood Pressure (Exit) 116/62 120/60      Heart Rate (Admit) 70 bpm 71 bpm      Heart Rate (Exercise) 94 bpm 109 bpm      Heart Rate (Exit) 72 bpm 47 bpm      Oxygen Saturation (Admit) 97 % 97 %      Oxygen Saturation (Exercise) 95 % 95 %      Oxygen Saturation (Exit) 96 % 96 %      Rating of Perceived Exertion (Exercise) 13 13      Perceived Dyspnea (Exercise) 2 3      Duration Continue with 30 min of aerobic exercise without signs/symptoms of physical distress. Continue with 30 min of aerobic exercise without signs/symptoms of physical distress.      Intensity THRR unchanged THRR unchanged        Progression   Progression Continue to progress workloads to maintain intensity without signs/symptoms of physical distress. Continue to progress workloads to maintain intensity without signs/symptoms of physical distress.        Resistance Training   Training Prescription Yes Yes      Weight 4 4      Reps 10-15 10-15        Treadmill   MPH 2 1.6      Grade 2 0      Minutes 15 15      METs 3.08 2.23        REL-XR   Level 5 5      Speed 48 46      Minutes 15 15      METs 1.7 4.1        Home Exercise Plan   Plans to continue exercise at Home (comment) Home (comment)      Frequency Add 3 additional days to program exercise sessions. Add 3 additional days to program exercise sessions.        Oxygen   Maintain Oxygen Saturation 88% or higher 88% or higher                Exercise Comments:   Exercise Goals and Review:  Exercise Goals     Row Name 05/12/23 1611             Exercise Goals   Increase Physical Activity Yes       Intervention Provide advice, education, support and counseling about physical activity/exercise needs.;Develop an individualized exercise prescription for aerobic and resistive training based on initial evaluation findings, risk stratification, comorbidities and participant's personal goals.       Expected Outcomes Short Term: Attend rehab on a regular basis to increase amount of physical activity.;Long Term: Add in home exercise to make exercise part of routine and to increase amount of physical activity.;Long Term: Exercising regularly at least 3-5 days a week.       Increase Strength and Stamina Yes       Intervention Provide advice, education, support and counseling about physical activity/exercise needs.;Develop an individualized exercise prescription for aerobic and resistive training based on initial evaluation findings, risk stratification, comorbidities and participant's personal goals.       Expected Outcomes Short Term: Increase workloads from initial exercise prescription for resistance, speed, and METs.;Short Term: Perform resistance training exercises routinely during rehab and add in resistance training at home;Long Term: Improve cardiorespiratory fitness, muscular endurance and strength as measured by increased METs and functional capacity ( )       Able to understand and use rate of perceived exertion (RPE) scale Yes       Intervention Provide education and explanation on how to use RPE scale       Expected Outcomes Short Term: Able to use RPE daily in rehab to express subjective intensity level;Long Term:  Able to use RPE to guide intensity level when exercising independently       Able to understand and use Dyspnea scale Yes       Intervention Provide education and explanation on how to use Dyspnea scale        Expected Outcomes Short Term: Able to use Dyspnea scale daily in rehab to express subjective sense of shortness of breath during exertion;Long Term: Able to use Dyspnea scale to guide intensity level when exercising independently       Knowledge and understanding of Target Heart Rate Range (THRR) Yes       Intervention Provide education and explanation of THRR including how the numbers were predicted and where they are located for reference       Expected Outcomes Short Term: Able to state/look up THRR;Long Term: Able to use THRR to govern intensity when exercising independently;Short Term: Able to use daily as guideline for intensity in rehab       Able to check pulse independently Yes       Intervention Review the importance of being able to check your own pulse for safety during independent exercise;Provide education and demonstration on how to check pulse in carotid and radial arteries.       Expected Outcomes Short Term: Able to explain why pulse checking is important during independent exercise;Long Term: Able to check pulse independently and accurately       Understanding of Exercise Prescription Yes       Intervention Provide education, explanation, and written materials on patient's individual exercise prescription       Expected Outcomes Short Term: Able to explain program exercise prescription;Long Term: Able to explain home exercise prescription to exercise independently                Exercise Goals Re-Evaluation :  Exercise Goals Re-Evaluation  Row Name 06/03/23 1114 06/23/23 1107           Exercise Goal Re-Evaluation   Exercise Goals Review Increase Physical Activity;Increase Strength and Stamina;Understanding of Exercise Prescription Increase Physical Activity;Increase Strength and Stamina;Able to understand and use rate of perceived exertion (RPE) scale;Able to understand and use Dyspnea scale;Knowledge and understanding of Target Heart Rate Range (THRR);Able to check  pulse independently;Understanding of Exercise Prescription      Comments Moise Boring is doing well in rehab.  He is not doing much at home yet.  We will review home exercise guidelines with him soon.  He is starting to feel like his stamina is improving and he is gettting his strength back. Donnie is doing well in rehab.  He is feeling pretty good overall.  He has been staying active at home with ADLs and walking his dog.  He has already noticed a difference in his ability to do his ADLs at home.  Pt plans to walk at home with dog and join brother at mall to walk for exercise.  Reviewed THR, pulse, RPE, sign and symptoms, pulse oximetery and when to call 911 or MD.  Also discussed weather considerations and indoor options.  Pt voiced understanding.      Expected Outcomes Short; review home exercise Long: Conitnue to exercise to build stamina Short: Start to walk with brother at mall Long; Continue to improve stamina.               Discharge Exercise Prescription (Final Exercise Prescription Changes):  Exercise Prescription Changes - 07/03/23 1500       Response to Exercise   Blood Pressure (Admit) 102/60    Blood Pressure (Exit) 120/60    Heart Rate (Admit) 71 bpm    Heart Rate (Exercise) 109 bpm    Heart Rate (Exit) 47 bpm    Oxygen Saturation (Admit) 97 %    Oxygen Saturation (Exercise) 95 %    Oxygen Saturation (Exit) 96 %    Rating of Perceived Exertion (Exercise) 13    Perceived Dyspnea (Exercise) 3    Duration Continue with 30 min of aerobic exercise without signs/symptoms of physical distress.    Intensity THRR unchanged      Progression   Progression Continue to progress workloads to maintain intensity without signs/symptoms of physical distress.      Resistance Training   Training Prescription Yes    Weight 4    Reps 10-15      Treadmill   MPH 1.6    Grade 0    Minutes 15    METs 2.23      REL-XR   Level 5    Speed 46    Minutes 15    METs 4.1      Home Exercise  Plan   Plans to continue exercise at Home (comment)    Frequency Add 3 additional days to program exercise sessions.      Oxygen   Maintain Oxygen Saturation 88% or higher             Nutrition:  Target Goals: Understanding of nutrition guidelines, daily intake of sodium 1500mg , cholesterol 200mg , calories 30% from fat and 7% or less from saturated fats, daily to have 5 or more servings of fruits and vegetables.  Biometrics:  Pre Biometrics - 05/12/23 1612       Pre Biometrics   Height 5' 10.5" (1.791 m)    Weight 285 lb 9.6 oz (129.5 kg)    Waist  Circumference 48 inches    Hip Circumference 45.5 inches    Waist to Hip Ratio 1.05 %    BMI (Calculated) 40.39    Grip Strength 47.3 kg    Single Leg Stand 23.8 seconds              Nutrition Therapy Plan and Nutrition Goals:  Nutrition Therapy & Goals - 05/12/23 1621       Intervention Plan   Intervention Prescribe, educate and counsel regarding individualized specific dietary modifications aiming towards targeted core components such as weight, hypertension, lipid management, diabetes, heart failure and other comorbidities.;Nutrition handout(s) given to patient.    Expected Outcomes Short Term Goal: Understand basic principles of dietary content, such as calories, fat, sodium, cholesterol and nutrients.;Long Term Goal: Adherence to prescribed nutrition plan.             Nutrition Assessments:  MEDIFICTS Score Key: >=70 Need to make dietary changes  40-70 Heart Healthy Diet <= 40 Therapeutic Level Cholesterol Diet  Flowsheet Row PULMONARY REHAB OTHER RESP ORIENTATION from 05/12/2023 in [facility] CARDIAC REHABILITATION  Picture Your Plate Total Score on Admission 58      Picture Your Plate Scores: <16 Unhealthy dietary pattern with much room for improvement. 41-50 Dietary pattern unlikely to meet recommendations for good health and room for improvement. 51-60 More healthful dietary pattern, with some room  for improvement.  >60 Healthy dietary pattern, although there may be some specific behaviors that could be improved.    Nutrition Goals Re-Evaluation:  Nutrition Goals Re-Evaluation     Row Name 06/03/23 1142 06/23/23 1116           Goals   Nutrition Goal Healthy Eating Short: Enjoy Christmas gooides Long: Focus on healthy variety      Comment Donnie is doing well in rehab.  He has been working on his diet but steriods are making him eat and keeping his weight up.  He has been baking lots of pies and cookies for CHristmas.  He plans to get back to diet after holidays and enjoys cooking all his meals at home.  He aims for a good variety. Donnie is doing well in rehab.  He enjoyed Christmas, but is now not feeling like he can eat.  He just does not feel like he is hungry.  He is back to cooking more at home again.  He is still trying for a good variety.      Expected Outcome Short: Enjoy Christmas gooides Long: Focus on healthy variety Short: Continue to cook more at home Long: continue to improve variety.               Nutrition Goals Discharge (Final Nutrition Goals Re-Evaluation):  Nutrition Goals Re-Evaluation - 06/23/23 1116       Goals   Nutrition Goal Short: Enjoy Christmas gooides Long: Focus on healthy variety    Comment Donnie is doing well in rehab.  He enjoyed Christmas, but is now not feeling like he can eat.  He just does not feel like he is hungry.  He is back to cooking more at home again.  He is still trying for a good variety.    Expected Outcome Short: Continue to cook more at home Long: continue to improve variety.             Psychosocial: Target Goals: Acknowledge presence or absence of significant depression and/or stress, maximize coping skills, provide positive support system. Participant is able to verbalize types and  ability to use techniques and skills needed for reducing stress and depression.  Initial Review & Psychosocial Screening:  Initial  Psych Review & Screening - 05/12/23 1237       Initial Review   Current issues with History of Depression;Current Stress Concerns    Source of Stress Concerns Chronic Illness;Financial;Occupation;Unable to participate in former interests or hobbies;Unable to perform yard/household activities    Comments worried about future with myastenia and job, currently filing for disability, breathing keeping him from doing activities, uses CPAP at night usually 7 hrs at night      Family Dynamics   Good Support System? Yes   wife (nurse), 3 brothers, kids, neighbors     Barriers   Psychosocial barriers to participate in program Psychosocial barriers identified (see note);The patient should benefit from training in stress management and relaxation.      Screening Interventions   Interventions Encouraged to exercise;Provide feedback about the scores to participant;To provide support and resources with identified psychosocial needs    Expected Outcomes Short Term goal: Utilizing psychosocial counselor, staff and physician to assist with identification of specific Stressors or current issues interfering with healing process. Setting desired goal for each stressor or current issue identified.;Long Term Goal: Stressors or current issues are controlled or eliminated.;Short Term goal: Identification and review with participant of any Quality of Life or Depression concerns found by scoring the questionnaire.;Long Term goal: The participant improves quality of Life and PHQ9 Scores as seen by post scores and/or verbalization of changes             Quality of Life Scores:  Scores of 19 and below usually indicate a poorer quality of life in these areas.  A difference of  2-3 points is a clinically meaningful difference.  A difference of 2-3 points in the total score of the Quality of Life Index has been associated with significant improvement in overall quality of life, self-image, physical symptoms, and general  health in studies assessing change in quality of life.   PHQ-9: Review Flowsheet       06/23/2023 05/12/2023  Depression screen PHQ 2/9  Decreased Interest 0 2  Down, Depressed, Hopeless 0 0  PHQ - 2 Score 0 2  Altered sleeping 2 2  Tired, decreased energy 1 2  Change in appetite 1 2  Feeling bad or failure about yourself  0 2  Trouble concentrating 1 1  Moving slowly or fidgety/restless 0 1  Suicidal thoughts 0 0  PHQ-9 Score 5 12  Difficult doing work/chores Somewhat difficult Somewhat difficult   Interpretation of Total Score  Total Score Depression Severity:  1-4 = Minimal depression, 5-9 = Mild depression, 10-14 = Moderate depression, 15-19 = Moderately severe depression, 20-27 = Severe depression   Psychosocial Evaluation and Intervention:  Psychosocial Evaluation - 05/12/23 1612       Psychosocial Evaluation & Interventions   Interventions Stress management education;Relaxation education;Encouraged to exercise with the program and follow exercise prescription    Comments Moise Boring is coming into Pulmonary Rehab with asthma.  He had done rehab before after his heart attack.  He is worried about finances and affording gas to get here.  He is currently applying for disability but has yet to get any payment yet.  He has also been out of work so no money has been coming in recently.  He is starting with 2 days a week to help with affordability.  If he has to go back to work, that will interfere  with his scheduling as well.  He has a great support system in his wife, kids, 3 brothers, and neighbors.  He states that everyone keeps checking up on him to make sure he is good.  He sleeps well with his CPAP and is very consistent with it.  He usually sleeps about 6-7 hrs but recently only been getting 4-5 hrs from being up with prednisone again.  He has gained almost 60 lbs since starting prednisone in February and really would like to lose the weight again.  He is looking forward to improving  his breathing and overall mobility, especially if he can get the weight back under better control.  Back in his 30s when this flared up the first time, he gained 80 lb and was able to get it back off.    Expected Outcomes Short: Attend rehab regularly to build up strength and breathing Long: Work on weight loss with exercise    Continue Psychosocial Services  Follow up required by staff             Psychosocial Re-Evaluation:  Psychosocial Re-Evaluation     Row Name 06/03/23 1140 06/23/23 1113           Psychosocial Re-Evaluation   Current issues with Current Sleep Concerns;Current Stress Concerns Current Sleep Concerns;Current Stress Concerns      Comments Moise Boring is doing well in rehab.  He rates his stress around medium due to his finances.  Since applying for disability he is not able to get paid as much and it is hard on him.  He also continues to have issues with sleep but did sleep well last night.  He is enjoying Christmas baking and discovering his mom's recipes that had been forgotten.  He is baking a lot currently and shares that love wiht his brothers. Donnie is doing well in rehab.  He is feeling better overall.  He is still struggling with sleep and got 6 hrs with his CPAP last night, but it was not continuos.  He did not feel great yesterday.  His PHQ has greatly improved from a 12 down to a 5.  A lot comes from not sleeping well and not feeling like eating.  He is still dealing with work disability issues and not getting paid.  Mena Pauls is not going to pay for his cancer treatments and he is working on getting his coverage back.      Expected Outcomes Short: Enjoy Christmas baking Long; Conitnue to work on sleep Short: Continue to sort through insurance issues Long: Conitnue to work on sleep      Interventions Encouraged to attend Pulmonary Rehabilitation for the exercise Encouraged to attend Pulmonary Rehabilitation for the exercise;Stress management education      Continue  Psychosocial Services  Follow up required by staff --               Psychosocial Discharge (Final Psychosocial Re-Evaluation):  Psychosocial Re-Evaluation - 06/23/23 1113       Psychosocial Re-Evaluation   Current issues with Current Sleep Concerns;Current Stress Concerns    Comments Moise Boring is doing well in rehab.  He is feeling better overall.  He is still struggling with sleep and got 6 hrs with his CPAP last night, but it was not continuos.  He did not feel great yesterday.  His PHQ has greatly improved from a 12 down to a 5.  A lot comes from not sleeping well and not feeling like eating.  He is still dealing  with work disability issues and not getting paid.  Mena Pauls is not going to pay for his cancer treatments and he is working on getting his coverage back.    Expected Outcomes Short: Continue to sort through insurance issues Long: Conitnue to work on sleep    Interventions Encouraged to attend Pulmonary Rehabilitation for the exercise;Stress management education              Education: Education Goals: Education classes will be provided on a weekly basis, covering required topics. Participant will state understanding/return demonstration of topics presented.  Learning Barriers/Preferences:  Learning Barriers/Preferences - 05/12/23 1233       Learning Barriers/Preferences   Learning Barriers Sight   glasses for reading   Learning Preferences Skilled Demonstration             Education Topics: How Lungs Work and Diseases: - Discuss the anatomy of the lungs and diseases that can affect the lungs, such as COPD.   Exercise: -Discuss the importance of exercise, FITT principles of exercise, normal and abnormal responses to exercise, and how to exercise safely.   Environmental Irritants: -Discuss types of environmental irritants and how to limit exposure to environmental irritants.   Meds/Inhalers and oxygen: - Discuss respiratory medications, definition of an  inhaler and oxygen, and the proper way to use an inhaler and oxygen.   Energy Saving Techniques: - Discuss methods to conserve energy and decrease shortness of breath when performing activities of daily living.    Bronchial Hygiene / Breathing Techniques: - Discuss breathing mechanics, pursed-lip breathing technique,  proper posture, effective ways to clear airways, and other functional breathing techniques   Cleaning Equipment: - Provides group verbal and written instruction about the health risks of elevated stress, cause of high stress, and healthy ways to reduce stress.   Nutrition I: Fats: - Discuss the types of cholesterol, what cholesterol does to the body, and how cholesterol levels can be controlled. Flowsheet Row PULMONARY REHAB OTHER RESPIRATORY from 07/03/2023 in [facility] RE[facility]  Date 05/29/23  Educator hb.  Instruction Review Code 1- Verbalizes Understanding       Nutrition II: Labels: -Discuss the different components of food labels and how to read food labels. Flowsheet Row PULMONARY REHAB OTHER RESPIRATORY from 07/03/2023 in [facility]  Date 05/29/23  Educator HB  Instruction Review Code 1- Verbalizes Understanding       Respiratory Infections: - Discuss the signs and symptoms of respiratory infections, ways to prevent respiratory infections, and the importance of seeking medical treatment when having a respiratory infection.   Stress I: Signs and Symptoms: - Discuss the causes of stress, how stress may lead to anxiety and depression, and ways to limit stress. Flowsheet Row PULMONARY REHAB OTHER RESPIRATORY from 07/03/2023 in [facility]  Date 06/19/23  Educator hb  Instruction Review Code 1- Verbalizes Understanding       Stress II: Relaxation: -Discuss relaxation techniques to limit stress.   Oxygen for Home/Travel: - Discuss how to prepare for travel when on oxygen and proper ways to  transport and store oxygen to ensure safety.   Knowledge Questionnaire Score:  Knowledge Questionnaire Score - 05/12/23 1621       Knowledge Questionnaire Score   Pre Score 16/18             Core Components/Risk Factors/Patient Goals at Admission:  Personal Goals and Risk Factors at Admission - 05/12/23 1621       Core Components/Risk Factors/Patient Goals  on Admission    Weight Management Yes;Obesity;Weight Loss    Intervention Weight Management: Develop a combined nutrition and exercise program designed to reach desired caloric intake, while maintaining appropriate intake of nutrient and fiber, sodium and fats, and appropriate energy expenditure required for the weight goal.;Weight Management: Provide education and appropriate resources to help participant work on and attain dietary goals.;Weight Management/Obesity: Establish reasonable short term and long term weight goals.;Obesity: Provide education and appropriate resources to help participant work on and attain dietary goals.    Admit Weight 285 lb 9.6 oz (129.5 kg)    Goal Weight: Short Term 280 lb (127 kg)    Goal Weight: Long Term 275 lb (124.7 kg)    Expected Outcomes Short Term: Continue to assess and modify interventions until short term weight is achieved;Long Term: Adherence to nutrition and physical activity/exercise program aimed toward attainment of established weight goal;Weight Loss: Understanding of general recommendations for a balanced deficit meal plan, which promotes 1-2 lb weight loss per week and includes a negative energy balance of [phone] kcal/d;Understanding recommendations for meals to include 15-35% energy as protein, 25-35% energy from fat, 35-60% energy from carbohydrates, less than 200mg  of dietary cholesterol, 20-35 gm of total fiber daily;Understanding of distribution of calorie intake throughout the day with the consumption of 4-5 meals/snacks    Improve shortness of breath with ADL's Yes     Intervention Provide education, individualized exercise plan and daily activity instruction to help decrease symptoms of SOB with activities of daily living.    Expected Outcomes Short Term: Improve cardiorespiratory fitness to achieve a reduction of symptoms when performing ADLs;Long Term: Be able to perform more ADLs without symptoms or delay the onset of symptoms    Increase knowledge of respiratory medications and ability to use respiratory devices properly  Yes    Intervention Provide education and demonstration as needed of appropriate use of medications, inhalers, and oxygen therapy.    Expected Outcomes Short Term: Achieves understanding of medications use. Understands that oxygen is a medication prescribed by physician. Demonstrates appropriate use of inhaler and oxygen therapy.;Long Term: Maintain appropriate use of medications, inhalers, and oxygen therapy.    Hypertension Yes    Intervention Provide education on lifestyle modifcations including regular physical activity/exercise, weight management, moderate sodium restriction and increased consumption of fresh fruit, vegetables, and low fat dairy, alcohol moderation, and smoking cessation.;Monitor prescription use compliance.    Expected Outcomes Short Term: Continued assessment and intervention until BP is < 140/62mm HG in hypertensive participants. < 130/29mm HG in hypertensive participants with diabetes, heart failure or chronic kidney disease.;Long Term: Maintenance of blood pressure at goal levels.    Lipids Yes    Intervention Provide education and support for participant on nutrition & aerobic/resistive exercise along with prescribed medications to achieve LDL 70mg , HDL >40mg .    Expected Outcomes Short Term: Participant states understanding of desired cholesterol values and is compliant with medications prescribed. Participant is following exercise prescription and nutrition guidelines.;Long Term: Cholesterol controlled with  medications as prescribed, with individualized exercise RX and with personalized nutrition plan. Value goals: LDL < 70mg , HDL > 40 mg.             Core Components/Risk Factors/Patient Goals Review:   Goals and Risk Factor Review     Row Name 06/03/23 1144 06/23/23 1118           Core Components/Risk Factors/Patient Goals Review   Personal Goals Review Weight Management/Obesity;Improve shortness of breath with ADL's;Increase knowledge of  respiratory medications and ability to use respiratory devices properly.;Hypertension Weight Management/Obesity;Improve shortness of breath with ADL's;Increase knowledge of respiratory medications and ability to use respiratory devices properly.;Hypertension      Review Donnie is doing well in rehab.  He still wants to lose weight but steriods are making it tough.  He had lost on wengovy previously but insurance stopped paying for it and he started steroirs and up his weight went again.  He is pressures are good and his breathing has been improving.  He has good days and bad days with breathing but more good since starting rehab. Donnie is doing well in rehab.  His breathing is getting better and he is able to do more at home now than when he started the program.  His weight is holding stready still.  He wants it to go down more.  His pressures are doing well and he is doing well with his medications as well.      Expected Outcomes Short: COnitnue to work on weight loss Long: Conitnue to monitor risk factors. Short: Conintue to work on getting weight down Long: Conitnue to work on breathing.               Core Components/Risk Factors/Patient Goals at Discharge (Final Review):   Goals and Risk Factor Review - 06/23/23 1118       Core Components/Risk Factors/Patient Goals Review   Personal Goals Review Weight Management/Obesity;Improve shortness of breath with ADL's;Increase knowledge of respiratory medications and ability to use respiratory devices  properly.;Hypertension    Review Donnie is doing well in rehab.  His breathing is getting better and he is able to do more at home now than when he started the program.  His weight is holding stready still.  He wants it to go down more.  His pressures are doing well and he is doing well with his medications as well.    Expected Outcomes Short: Conintue to work on getting weight down Long: Conitnue to work on breathing.             ITP Comments:  ITP Comments     Row Name 05/12/23 1606 05/13/23 1504 05/14/23 1306 06/10/23 1441 07/09/23 1144   ITP Comments Patient attend orientation today.  Patient is attending Pulmonary Rehabilitation Program.  Documentation for diagnosis can be found in [facility] media tab for Cardiac Rehab.  Reviewed medical chart, RPE/RPD, gym safety, and program guidelines.  Patient was fitted to equipment they will be using during rehab.  Patient is scheduled to start exercise on Tuesday 05/13/23 at 3pm.   Initial ITP created and sent for review and signature by Dr. Erick Blinks, Medical Director for Pulmonary Rehabilitation Program. irst full day of exercise!  Patient was oriented to gym and equipment including functions, settings, policies, and procedures.  Patient's individual exercise prescription and treatment plan were reviewed.  All starting workloads were established based on the results of the 6 minute walk test done at initial orientation visit.  The plan for exercise progression was also introduced and progression will be customized based on patient's performance and goals. 30 day review completed. ITP sent to Dr.Jehanzeb Memon, Medical Director of  Pulmonary Rehab. Continue with ITP unless changes are made by physician.  New to program only completed orientation and 1 exercise session thus far. 30 day review completed. ITP sent to Dr.Jehanzeb Memon, Medical Director of  Pulmonary Rehab. Continue with ITP unless changes are made by physician. 30 day review completed. ITP sent  to Dr.Jehanzeb Memon, Wellsite geologist of  Pulmonary Rehab. Continue with ITP unless changes are made by physician.            Comments: 30 day review

## 2023-07-10 ENCOUNTER — Encounter (HOSPITAL_COMMUNITY): Payer: BC Managed Care – PPO

## 2023-07-14 ENCOUNTER — Encounter (HOSPITAL_COMMUNITY): Payer: BC Managed Care – PPO

## 2023-07-14 DIAGNOSIS — J4531 Mild persistent asthma with (acute) exacerbation: Secondary | ICD-10-CM | POA: Insufficient documentation

## 2023-07-15 ENCOUNTER — Encounter (HOSPITAL_COMMUNITY): Payer: BC Managed Care – PPO

## 2023-07-17 ENCOUNTER — Encounter (HOSPITAL_COMMUNITY)
Admission: RE | Admit: 2023-07-17 | Discharge: 2023-07-17 | Disposition: A | Discharge status: Home / Self Care | Payer: BC Managed Care – PPO | Source: Ambulatory Visit | Attending: Pulmonary Disease | Admitting: Pulmonary Disease

## 2023-07-17 DIAGNOSIS — J4531 Mild persistent asthma with (acute) exacerbation: Secondary | ICD-10-CM

## 2023-07-17 NOTE — Progress Notes (Signed)
 Daily Session Note  Patient Details  Name: ALPHONSE ASBRIDGE MRN: [id number] Date of Birth: 06-26-1960 Referring Provider:   Flowsheet Row PULMONARY REHAB OTHER RESP ORIENTATION from 05/12/2023 in [facility] CARDIAC REHABILITATION  Referring Provider Ronnette Cools MD       Encounter Date: 07/17/2023  Check In:  Session Check In - 07/17/23 1315       Check-In   Supervising physician immediately available to respond to emergencies See telemetry face sheet for immediately available ER MD    Location [facility]    Staff Present Powell Benders, BS, Exercise Physiologist;Salvatore Shear Vicci, RN, BSN    Virtual Visit No    Medication changes reported     No    Fall or balance concerns reported    No    Warm-up and Cool-down Performed on first and last piece of equipment    Resistance Training Performed Yes    VAD Patient? No    PAD/SET Patient? No      Pain Assessment   Currently in Pain? No/denies    Multiple Pain Sites No             Capillary Blood Glucose: No results found for this or any previous visit (from the past 24 hours).    Social History   Tobacco Use  Smoking Status Former   Current packs/day: 0.00   Average packs/day: 1 pack/day for 15.0 years (15.0 ttl pk-yrs)   Types: Cigarettes   Start date: 1   Quit date: 19   Years since quitting: 35.1   Passive exposure: Never  Smokeless Tobacco Never    Goals Met:  Proper associated with RPD/PD & O2 Sat Independence with exercise equipment Using PLB without cueing & demonstrates good technique Exercise tolerated well No report of concerns or symptoms today Strength training completed today  Goals Unmet:  Not Applicable  Comments: Pt able to follow exercise prescription today without complaint.  Will continue to monitor for progression.

## 2023-07-22 ENCOUNTER — Encounter (HOSPITAL_COMMUNITY): Payer: BC Managed Care – PPO

## 2023-07-24 ENCOUNTER — Encounter (HOSPITAL_COMMUNITY): Payer: BC Managed Care – PPO

## 2023-07-29 ENCOUNTER — Encounter (HOSPITAL_COMMUNITY): Payer: BC Managed Care – PPO

## 2023-07-31 ENCOUNTER — Encounter (HOSPITAL_COMMUNITY): Payer: BC Managed Care – PPO

## 2023-08-05 ENCOUNTER — Encounter (HOSPITAL_COMMUNITY): Payer: BC Managed Care – PPO

## 2023-08-06 ENCOUNTER — Encounter (HOSPITAL_COMMUNITY): Payer: Self-pay | Admitting: *Deleted

## 2023-08-06 DIAGNOSIS — J4531 Mild persistent asthma with (acute) exacerbation: Secondary | ICD-10-CM

## 2023-08-06 NOTE — Progress Notes (Signed)
 Pulmonary Individual Treatment Plan  Patient Details  Name: Nicholas Andersen MRN: [id number] Date of Birth: December 20, 1960 Referring Provider:   Flowsheet Row PULMONARY REHAB OTHER RESP ORIENTATION from 05/12/2023 in [facility] CARDIAC REHABILITATION  Referring Provider Merlyn Albert MD       Initial Encounter Date:  Flowsheet Row PULMONARY REHAB OTHER RESP ORIENTATION from 05/12/2023 in [facility] CARDIAC REHABILITATION  Date 05/12/23       Visit Diagnosis: Mild persistent asthma with acute exacerbation  Patient's Home Medications on Admission:   Current Outpatient Medications:    allopurinol (ZYLOPRIM) 100 MG tablet, Take 100 mg by mouth daily., Disp: , Rfl:    ALPRAZolam (XANAX) 1 MG tablet, 1 mg as needed., Disp: , Rfl:    amLODipine-benazepril (LOTREL) 10-20 MG capsule, Take 1 capsule by mouth daily., Disp: , Rfl:    aspirin EC 81 MG tablet, Take 81 mg by mouth daily. Swallow whole., Disp: , Rfl:    atorvastatin (LIPITOR) 80 MG tablet, Take 80 mg by mouth daily., Disp: , Rfl:    baclofen (LIORESAL) 10 MG tablet, Take 10 mg by mouth 2 (two) times daily as needed., Disp: , Rfl:    BREO ELLIPTA 200-25 MCG/ACT AEPB, Inhale 1 puff into the lungs daily., Disp: , Rfl:    cetirizine (ZYRTEC) 10 MG tablet, Take 10 mg by mouth as needed., Disp: , Rfl:    Cholecalciferol (VITAMIN D3) 50 MCG (2000 UT) TABS, Take 1 tablet by mouth daily., Disp: , Rfl:    Efgartigimod alfa-fcab (VYVGART IV), Inject into the vein every 30 (thirty) days., Disp: , Rfl:    famotidine (PEPCID) 20 MG tablet, Take 1 tablet by mouth 2 (two) times daily., Disp: , Rfl:    Misc. Devices MISC, by Does not apply route. CPAP, Disp: , Rfl:    Multiple Vitamin (MULTIVITAMIN WITH MINERALS) TABS tablet, Take 1 tablet by mouth daily., Disp: , Rfl:    mycophenolate (CELLCEPT) 500 MG tablet, Take 3 tabs by mouth every morning & 2 tabs every evening, Disp: , Rfl:    nitroGLYCERIN (NITROSTAT) 0.4 MG SL tablet, Place 1 tablet (0.4  mg total) under the tongue every 5 (five) minutes x 3 doses as needed for chest pain., Disp: 25 tablet, Rfl: 2   predniSONE (DELTASONE) 10 MG tablet, Take 10 mg by mouth daily., Disp: , Rfl:    pyridostigmine (MESTINON) 60 MG tablet, Take 30 mg by mouth 2 (two) times daily., Disp: , Rfl:    spironolactone (ALDACTONE) 25 MG tablet, Take 1 tablet (25 mg total) by mouth daily., Disp: 90 tablet, Rfl: 3  Past Medical History: Past Medical History:  Diagnosis Date   Asthma    Coronary artery disease    Gout    Hypercholesterolemia    Hypertension     Tobacco Use: Social History   Tobacco Use  Smoking Status Former   Current packs/day: 0.00   Average packs/day: 1 pack/day for 15.0 years (15.0 ttl pk-yrs)   Types: Cigarettes   Start date: 20   Quit date: 41   Years since quitting: 35.1   Passive exposure: Never  Smokeless Tobacco Never    Labs: Review Flowsheet       Latest Ref Rng & Units 12/19/2020 04/18/2021  Labs for ITP Cardiac and Pulmonary Rehab  Cholestrol 0 - 200 mg/dL 782  956   LDL (calc) 0 - 99 mg/dL 92  70   HDL-C >21 mg/dL 47  44   Trlycerides <308 mg/dL 657  62   Hemoglobin A1c 4.8 - 5.6 % 5.5  -    Capillary Blood Glucose: No results found for: "GLUCAP"   Pulmonary Assessment Scores:  Pulmonary Assessment Scores     Row Name 05/12/23 1622         ADL UCSD   ADL Phase Entry     SOB Score total 53     Rest 1     Walk 2     Stairs 4     Bath 4     Dress 4     Shop 2       CAT Score   CAT Score 23       mMRC Score   mMRC Score 1             UCSD: Self-administered rating of dyspnea associated with activities of daily living (ADLs) 6-point scale (0 = "not at all" to 5 = "maximal or unable to do because of breathlessness")  Scoring Scores range from 0 to 120.  Minimally important difference is 5 units  CAT: CAT can identify the health impairment of COPD patients and is better correlated with disease progression.  CAT has a scoring  range of zero to 40. The CAT score is classified into four groups of low (less than 10), medium (10 - 20), high (21-30) and very high (31-40) based on the impact level of disease on health status. A CAT score over 10 suggests significant symptoms.  A worsening CAT score could be explained by an exacerbation, poor medication adherence, poor inhaler technique, or progression of COPD or comorbid conditions.  CAT MCID is 2 points  mMRC: mMRC (Modified Medical Research Council) Dyspnea Scale is used to assess the degree of baseline functional disability in patients of respiratory disease due to dyspnea. No minimal important difference is established. A decrease in score of 1 point or greater is considered a positive change.   Pulmonary Function Assessment:  Pulmonary Function Assessment - 05/12/23 1622       Breath   Shortness of Breath Yes;Fear of Shortness of Breath;Limiting activity             Exercise Target Goals: Exercise Program Goal: Individual exercise prescription set using results from initial 6 min walk test and THRR while considering  patient's activity barriers and safety.   Exercise Prescription Goal: Initial exercise prescription builds to 30-45 minutes a day of aerobic activity, 2-3 days per week.  Home exercise guidelines will be given to patient during program as part of exercise prescription that the participant will acknowledge.  Activity Barriers & Risk Stratification:  Activity Barriers & Cardiac Risk Stratification - 05/12/23 1239       Activity Barriers & Cardiac Risk Stratification   Activity Barriers Deconditioning;Muscular Weakness;Shortness of Breath;Back Problems;Neck/Spine Problems;History of Falls;Other (comment)   buldging disc in low back   Comments Myasthenia Gravis (x2 yrs)             6 Minute Walk:  6 Minute Walk     Row Name 05/12/23 1608         6 Minute Walk   Phase Initial     Distance 1280 feet     Walk Time 6 minutes     # of  Rest Breaks 0     MPH 2.42     METS 3.05     RPE 13     Perceived Dyspnea  2     VO2 Peak 10.67  Symptoms No     Resting HR 53 bpm     Resting BP 132/74     Resting Oxygen Saturation  97 %     Exercise Oxygen Saturation  during 6 min walk 94 %     Max Ex. HR 122 bpm     Max Ex. BP 164/84     2 Minute Post BP 152/70       Interval HR   1 Minute HR 84     2 Minute HR 96     3 Minute HR 115     4 Minute HR 122     5 Minute HR 118     6 Minute HR 113     2 Minute Post HR 53     Interval Heart Rate? Yes       Interval Oxygen   Interval Oxygen? Yes     Baseline Oxygen Saturation % 97 %     1 Minute Oxygen Saturation % 94 %     1 Minute Liters of Oxygen 0 L  Room Air     2 Minute Oxygen Saturation % 94 %     2 Minute Liters of Oxygen 0 L     3 Minute Oxygen Saturation % 95 %     3 Minute Liters of Oxygen 0 L     4 Minute Oxygen Saturation % 95 %     4 Minute Liters of Oxygen 0 L     5 Minute Oxygen Saturation % 96 %     5 Minute Liters of Oxygen 0 L     6 Minute Oxygen Saturation % 96 %     6 Minute Liters of Oxygen 0 L     2 Minute Post Oxygen Saturation % 97 %     2 Minute Post Liters of Oxygen 0 L              Oxygen Initial Assessment:  Oxygen Initial Assessment - 05/12/23 1244       Home Oxygen   Home Oxygen Device None    Sleep Oxygen Prescription CPAP;None    Home Exercise Oxygen Prescription None    Home Resting Oxygen Prescription None    Compliance with Home Oxygen Use Yes      Initial 6 min Walk   Oxygen Used None      Program Oxygen Prescription   Program Oxygen Prescription None      Intervention   Short Term Goals To learn and exhibit compliance with exercise, home and travel O2 prescription;To learn and understand importance of monitoring SPO2 with pulse oximeter and demonstrate accurate use of the pulse oximeter.;To learn and understand importance of maintaining oxygen saturations>88%;To learn and demonstrate proper use of respiratory  medications;To learn and demonstrate proper pursed lip breathing techniques or other breathing techniques.     Long  Term Goals Exhibits compliance with exercise, home  and travel O2 prescription;Maintenance of O2 saturations>88%;Compliance with respiratory medication;Verbalizes importance of monitoring SPO2 with pulse oximeter and return demonstration;Exhibits proper breathing techniques, such as pursed lip breathing or other method taught during program session;Demonstrates proper use of MDI's             Oxygen Re-Evaluation:  Oxygen Re-Evaluation     Row Name 06/03/23 1145 06/23/23 1119           Program Oxygen Prescription   Program Oxygen Prescription None None        Home Oxygen   Home Oxygen  Device None None      Sleep Oxygen Prescription None;CPAP None;CPAP      Home Exercise Oxygen Prescription None None      Home Resting Oxygen Prescription None None      Compliance with Home Oxygen Use Yes Yes        Goals/Expected Outcomes   Short Term Goals To learn and exhibit compliance with exercise, home and travel O2 prescription;To learn and understand importance of monitoring SPO2 with pulse oximeter and demonstrate accurate use of the pulse oximeter.;To learn and understand importance of maintaining oxygen saturations>88%;To learn and demonstrate proper use of respiratory medications;To learn and demonstrate proper pursed lip breathing techniques or other breathing techniques.  To learn and exhibit compliance with exercise, home and travel O2 prescription;To learn and understand importance of monitoring SPO2 with pulse oximeter and demonstrate accurate use of the pulse oximeter.;To learn and understand importance of maintaining oxygen saturations>88%;To learn and demonstrate proper use of respiratory medications;To learn and demonstrate proper pursed lip breathing techniques or other breathing techniques.       Long  Term Goals Exhibits compliance with exercise, home  and travel O2  prescription;Maintenance of O2 saturations>88%;Compliance with respiratory medication;Verbalizes importance of monitoring SPO2 with pulse oximeter and return demonstration;Exhibits proper breathing techniques, such as pursed lip breathing or other method taught during program session;Demonstrates proper use of MDI's Exhibits compliance with exercise, home  and travel O2 prescription;Maintenance of O2 saturations>88%;Compliance with respiratory medication;Verbalizes importance of monitoring SPO2 with pulse oximeter and return demonstration;Exhibits proper breathing techniques, such as pursed lip breathing or other method taught during program session;Demonstrates proper use of MDI's      Comments Nicholas Andersen is doing well in rehab.  He is compliant wiht his CPAP at night, but still doesn't sleep well.  His breathing is getting better and he uses his inhalers as needed.  He is feeling better overall. Nicholas Andersen is doing well in rehab.  He is compliant with his CPAP and uses it each night.  He as been working on his breathing and it is getting better.  He continues to monitor his saturations at home and will take it with him to exercise.      Goals/Expected Outcomes Short: Continue to use PLB Long: Continued to improve breathing. Short: Continue to use PLB  long Continued compliance with CPAP               Oxygen Discharge (Final Oxygen Re-Evaluation):  Oxygen Re-Evaluation - 06/23/23 1119       Program Oxygen Prescription   Program Oxygen Prescription None      Home Oxygen   Home Oxygen Device None    Sleep Oxygen Prescription None;CPAP    Home Exercise Oxygen Prescription None    Home Resting Oxygen Prescription None    Compliance with Home Oxygen Use Yes      Goals/Expected Outcomes   Short Term Goals To learn and exhibit compliance with exercise, home and travel O2 prescription;To learn and understand importance of monitoring SPO2 with pulse oximeter and demonstrate accurate use of the pulse  oximeter.;To learn and understand importance of maintaining oxygen saturations>88%;To learn and demonstrate proper use of respiratory medications;To learn and demonstrate proper pursed lip breathing techniques or other breathing techniques.     Long  Term Goals Exhibits compliance with exercise, home  and travel O2 prescription;Maintenance of O2 saturations>88%;Compliance with respiratory medication;Verbalizes importance of monitoring SPO2 with pulse oximeter and return demonstration;Exhibits proper breathing techniques, such as pursed lip breathing or other  method taught during program session;Demonstrates proper use of MDI's    Comments Nicholas Andersen is doing well in rehab.  He is compliant with his CPAP and uses it each night.  He as been working on his breathing and it is getting better.  He continues to monitor his saturations at home and will take it with him to exercise.    Goals/Expected Outcomes Short: Continue to use PLB  long Continued compliance with CPAP             Initial Exercise Prescription:  Initial Exercise Prescription - 05/12/23 1600       Date of Initial Exercise RX and Referring Provider   Date 05/12/23    Referring Provider Alghrim, Rondall Allegra MD      Oxygen   Maintain Oxygen Saturation 88% or higher      Treadmill   MPH 2.3    Grade 0.5    Minutes 15    METs 2.92      REL-XR   Level 3    Speed 50    Minutes 15    METs 2.5      Prescription Details   Frequency (times per week) 2    Duration Progress to 30 minutes of continuous aerobic without signs/symptoms of physical distress      Intensity   THRR 40-80% of Max Heartrate 95-137    Ratings of Perceived Exertion 11-13    Perceived Dyspnea 0-4      Progression   Progression Continue to progress workloads to maintain intensity without signs/symptoms of physical distress.      Resistance Training   Training Prescription Yes    Weight 3 lb    Reps 10-15             Perform Capillary Blood Glucose  checks as needed.  Exercise Prescription Changes:   Exercise Prescription Changes     Row Name 05/12/23 1600 05/22/23 1500 06/03/23 1200 06/10/23 1500 06/23/23 1100     Response to Exercise   Blood Pressure (Admit) 132/74 120/68 122/65 126/72 --   Blood Pressure (Exercise) 164/84 162/68 -- 124/65 --   Blood Pressure (Exit) 142/72 126/68 126/64 132/78 --   Heart Rate (Admit) 53 bpm 65 bpm 70 bpm 84 bpm --   Heart Rate (Exercise) 122 bpm 110 bpm 110 bpm 95 bpm --   Heart Rate (Exit) 63 bpm 85 bpm 85 bpm 77 bpm --   Oxygen Saturation (Admit) 97 % 96 % 95 % 96 % --   Oxygen Saturation (Exercise) 94 % 95 % 96 % 94 % --   Oxygen Saturation (Exit) 96 % 97 % 97 % 94 % --   Rating of Perceived Exertion (Exercise) 13 13 13 13  --   Perceived Dyspnea (Exercise) 2 3 2 1  --   Symptoms SOB sob -- -- --   Comments walk test results -- -- -- --   Duration -- Continue with 30 min of aerobic exercise without signs/symptoms of physical distress. Continue with 30 min of aerobic exercise without signs/symptoms of physical distress. Continue with 30 min of aerobic exercise without signs/symptoms of physical distress. --   Intensity -- THRR unchanged THRR unchanged THRR unchanged --     Progression   Progression -- Continue to progress workloads to maintain intensity without signs/symptoms of physical distress. Continue to progress workloads to maintain intensity without signs/symptoms of physical distress. Continue to progress workloads to maintain intensity without signs/symptoms of physical distress. --     Resistance  Training   Training Prescription -- Yes Yes Yes --   Weight -- 4 lbs 4 lbs 4 lbs --   Reps -- 10-15 10-15 10-15 --     Treadmill   MPH -- 1.7 1.8 1.8 --   Grade -- 0 0 0 --   Minutes -- 15 15 15  --   METs -- 2.3 2.38 2.38 --     REL-XR   Level -- 4 6 5  --   Speed -- 60 58 66 --   Minutes -- 15 15 15  --   METs -- 2.9 5 4.5 --     Home Exercise Plan   Plans to continue exercise  at -- -- -- -- Home (comment)  walking with dog and brother at mall   Frequency -- -- -- -- Add 3 additional days to program exercise sessions.   Initial Home Exercises Provided -- -- -- -- 06/23/23     Oxygen   Maintain Oxygen Saturation -- 88% or higher 88% or higher 88% or higher --    Row Name 06/23/23 1300 07/03/23 1500           Response to Exercise   Blood Pressure (Admit) 136/62 102/60      Blood Pressure (Exit) 116/62 120/60      Heart Rate (Admit) 70 bpm 71 bpm      Heart Rate (Exercise) 94 bpm 109 bpm      Heart Rate (Exit) 72 bpm 47 bpm      Oxygen Saturation (Admit) 97 % 97 %      Oxygen Saturation (Exercise) 95 % 95 %      Oxygen Saturation (Exit) 96 % 96 %      Rating of Perceived Exertion (Exercise) 13 13      Perceived Dyspnea (Exercise) 2 3      Duration Continue with 30 min of aerobic exercise without signs/symptoms of physical distress. Continue with 30 min of aerobic exercise without signs/symptoms of physical distress.      Intensity THRR unchanged THRR unchanged        Progression   Progression Continue to progress workloads to maintain intensity without signs/symptoms of physical distress. Continue to progress workloads to maintain intensity without signs/symptoms of physical distress.        Resistance Training   Training Prescription Yes Yes      Weight 4 4      Reps 10-15 10-15        Treadmill   MPH 2 1.6      Grade 2 0      Minutes 15 15      METs 3.08 2.23        REL-XR   Level 5 5      Speed 48 46      Minutes 15 15      METs 1.7 4.1        Home Exercise Plan   Plans to continue exercise at Home (comment) Home (comment)      Frequency Add 3 additional days to program exercise sessions. Add 3 additional days to program exercise sessions.        Oxygen   Maintain Oxygen Saturation 88% or higher 88% or higher               Exercise Comments:   Exercise Goals and Review:   Exercise Goals     Row Name 05/12/23 1611  Exercise Goals   Increase Physical Activity Yes       Intervention Provide advice, education, support and counseling about physical activity/exercise needs.;Develop an individualized exercise prescription for aerobic and resistive training based on initial evaluation findings, risk stratification, comorbidities and participant's personal goals.       Expected Outcomes Short Term: Attend rehab on a regular basis to increase amount of physical activity.;Long Term: Add in home exercise to make exercise part of routine and to increase amount of physical activity.;Long Term: Exercising regularly at least 3-5 days a week.       Increase Strength and Stamina Yes       Intervention Provide advice, education, support and counseling about physical activity/exercise needs.;Develop an individualized exercise prescription for aerobic and resistive training based on initial evaluation findings, risk stratification, comorbidities and participant's personal goals.       Expected Outcomes Short Term: Increase workloads from initial exercise prescription for resistance, speed, and METs.;Short Term: Perform resistance training exercises routinely during rehab and add in resistance training at home;Long Term: Improve cardiorespiratory fitness, muscular endurance and strength as measured by increased METs and functional capacity ( )       Able to understand and use rate of perceived exertion (RPE) scale Yes       Intervention Provide education and explanation on how to use RPE scale       Expected Outcomes Short Term: Able to use RPE daily in rehab to express subjective intensity level;Long Term:  Able to use RPE to guide intensity level when exercising independently       Able to understand and use Dyspnea scale Yes       Intervention Provide education and explanation on how to use Dyspnea scale       Expected Outcomes Short Term: Able to use Dyspnea scale daily in rehab to express subjective sense of shortness  of breath during exertion;Long Term: Able to use Dyspnea scale to guide intensity level when exercising independently       Knowledge and understanding of Target Heart Rate Range (THRR) Yes       Intervention Provide education and explanation of THRR including how the numbers were predicted and where they are located for reference       Expected Outcomes Short Term: Able to state/look up THRR;Long Term: Able to use THRR to govern intensity when exercising independently;Short Term: Able to use daily as guideline for intensity in rehab       Able to check pulse independently Yes       Intervention Review the importance of being able to check your own pulse for safety during independent exercise;Provide education and demonstration on how to check pulse in carotid and radial arteries.       Expected Outcomes Short Term: Able to explain why pulse checking is important during independent exercise;Long Term: Able to check pulse independently and accurately       Understanding of Exercise Prescription Yes       Intervention Provide education, explanation, and written materials on patient's individual exercise prescription       Expected Outcomes Short Term: Able to explain program exercise prescription;Long Term: Able to explain home exercise prescription to exercise independently                Exercise Goals Re-Evaluation :  Exercise Goals Re-Evaluation     Row Name 06/03/23 1114 06/23/23 1107           Exercise Goal Re-Evaluation   Exercise  Goals Review Increase Physical Activity;Increase Strength and Stamina;Understanding of Exercise Prescription Increase Physical Activity;Increase Strength and Stamina;Able to understand and use rate of perceived exertion (RPE) scale;Able to understand and use Dyspnea scale;Knowledge and understanding of Target Heart Rate Range (THRR);Able to check pulse independently;Understanding of Exercise Prescription      Comments Nicholas Andersen is doing well in rehab.  He is  not doing much at home yet.  We will review home exercise guidelines with him soon.  He is starting to feel like his stamina is improving and he is gettting his strength back. Nicholas Andersen is doing well in rehab.  He is feeling pretty good overall.  He has been staying active at home with ADLs and walking his dog.  He has already noticed a difference in his ability to do his ADLs at home.  Pt plans to walk at home with dog and join brother at mall to walk for exercise.  Reviewed THR, pulse, RPE, sign and symptoms, pulse oximetery and when to call 911 or MD.  Also discussed weather considerations and indoor options.  Pt voiced understanding.      Expected Outcomes Short; review home exercise Long: Conitnue to exercise to build stamina Short: Start to walk with brother at mall Long; Continue to improve stamina.               Discharge Exercise Prescription (Final Exercise Prescription Changes):  Exercise Prescription Changes - 07/03/23 1500       Response to Exercise   Blood Pressure (Admit) 102/60    Blood Pressure (Exit) 120/60    Heart Rate (Admit) 71 bpm    Heart Rate (Exercise) 109 bpm    Heart Rate (Exit) 47 bpm    Oxygen Saturation (Admit) 97 %    Oxygen Saturation (Exercise) 95 %    Oxygen Saturation (Exit) 96 %    Rating of Perceived Exertion (Exercise) 13    Perceived Dyspnea (Exercise) 3    Duration Continue with 30 min of aerobic exercise without signs/symptoms of physical distress.    Intensity THRR unchanged      Progression   Progression Continue to progress workloads to maintain intensity without signs/symptoms of physical distress.      Resistance Training   Training Prescription Yes    Weight 4    Reps 10-15      Treadmill   MPH 1.6    Grade 0    Minutes 15    METs 2.23      REL-XR   Level 5    Speed 46    Minutes 15    METs 4.1      Home Exercise Plan   Plans to continue exercise at Home (comment)    Frequency Add 3 additional days to program exercise  sessions.      Oxygen   Maintain Oxygen Saturation 88% or higher             Nutrition:  Target Goals: Understanding of nutrition guidelines, daily intake of sodium 1500mg , cholesterol 200mg , calories 30% from fat and 7% or less from saturated fats, daily to have 5 or more servings of fruits and vegetables.  Biometrics:  Pre Biometrics - 05/12/23 1612       Pre Biometrics   Height 5' 10.5" (1.791 m)    Weight 285 lb 9.6 oz (129.5 kg)    Waist Circumference 48 inches    Hip Circumference 45.5 inches    Waist to Hip Ratio 1.05 %  BMI (Calculated) 40.39    Grip Strength 47.3 kg    Single Leg Stand 23.8 seconds              Nutrition Therapy Plan and Nutrition Goals:  Nutrition Therapy & Goals - 05/12/23 1621       Intervention Plan   Intervention Prescribe, educate and counsel regarding individualized specific dietary modifications aiming towards targeted core components such as weight, hypertension, lipid management, diabetes, heart failure and other comorbidities.;Nutrition handout(s) given to patient.    Expected Outcomes Short Term Goal: Understand basic principles of dietary content, such as calories, fat, sodium, cholesterol and nutrients.;Long Term Goal: Adherence to prescribed nutrition plan.             Nutrition Assessments:  MEDIFICTS Score Key: >=70 Need to make dietary changes  40-70 Heart Healthy Diet <= 40 Therapeutic Level Cholesterol Diet  Flowsheet Row PULMONARY REHAB OTHER RESP ORIENTATION from 05/12/2023 in [facility] CARDIAC REHABILITATION  Picture Your Plate Total Score on Admission 58      Picture Your Plate Scores: <16 Unhealthy dietary pattern with much room for improvement. 41-50 Dietary pattern unlikely to meet recommendations for good health and room for improvement. 51-60 More healthful dietary pattern, with some room for improvement.  >60 Healthy dietary pattern, although there may be some specific behaviors that could be  improved.    Nutrition Goals Re-Evaluation:  Nutrition Goals Re-Evaluation     Row Name 06/03/23 1142 06/23/23 1116           Goals   Nutrition Goal Healthy Eating Short: Enjoy Christmas gooides Long: Focus on healthy variety      Comment Nicholas Andersen is doing well in rehab.  He has been working on his diet but steriods are making him eat and keeping his weight up.  He has been baking lots of pies and cookies for CHristmas.  He plans to get back to diet after holidays and enjoys cooking all his meals at home.  He aims for a good variety. Nicholas Andersen is doing well in rehab.  He enjoyed Christmas, but is now not feeling like he can eat.  He just does not feel like he is hungry.  He is back to cooking more at home again.  He is still trying for a good variety.      Expected Outcome Short: Enjoy Christmas gooides Long: Focus on healthy variety Short: Continue to cook more at home Long: continue to improve variety.               Nutrition Goals Discharge (Final Nutrition Goals Re-Evaluation):  Nutrition Goals Re-Evaluation - 06/23/23 1116       Goals   Nutrition Goal Short: Enjoy Christmas gooides Long: Focus on healthy variety    Comment Nicholas Andersen is doing well in rehab.  He enjoyed Christmas, but is now not feeling like he can eat.  He just does not feel like he is hungry.  He is back to cooking more at home again.  He is still trying for a good variety.    Expected Outcome Short: Continue to cook more at home Long: continue to improve variety.             Psychosocial: Target Goals: Acknowledge presence or absence of significant depression and/or stress, maximize coping skills, provide positive support system. Participant is able to verbalize types and ability to use techniques and skills needed for reducing stress and depression.  Initial Review & Psychosocial Screening:  Initial Psych Review &  Screening - 05/12/23 1237       Initial Review   Current issues with History of  Depression;Current Stress Concerns    Source of Stress Concerns Chronic Illness;Financial;Occupation;Unable to participate in former interests or hobbies;Unable to perform yard/household activities    Comments worried about future with myastenia and job, currently filing for disability, breathing keeping him from doing activities, uses CPAP at night usually 7 hrs at night      Family Dynamics   Good Support System? Yes   wife (nurse), 3 brothers, kids, neighbors     Barriers   Psychosocial barriers to participate in program Psychosocial barriers identified (see note);The patient should benefit from training in stress management and relaxation.      Screening Interventions   Interventions Encouraged to exercise;Provide feedback about the scores to participant;To provide support and resources with identified psychosocial needs    Expected Outcomes Short Term goal: Utilizing psychosocial counselor, staff and physician to assist with identification of specific Stressors or current issues interfering with healing process. Setting desired goal for each stressor or current issue identified.;Long Term Goal: Stressors or current issues are controlled or eliminated.;Short Term goal: Identification and review with participant of any Quality of Life or Depression concerns found by scoring the questionnaire.;Long Term goal: The participant improves quality of Life and PHQ9 Scores as seen by post scores and/or verbalization of changes             Quality of Life Scores:  Scores of 19 and below usually indicate a poorer quality of life in these areas.  A difference of  2-3 points is a clinically meaningful difference.  A difference of 2-3 points in the total score of the Quality of Life Index has been associated with significant improvement in overall quality of life, self-image, physical symptoms, and general health in studies assessing change in quality of life.   PHQ-9: Review Flowsheet        06/23/2023 05/12/2023  Depression screen PHQ 2/9  Decreased Interest 0 2  Down, Depressed, Hopeless 0 0  PHQ - 2 Score 0 2  Altered sleeping 2 2  Tired, decreased energy 1 2  Change in appetite 1 2  Feeling bad or failure about yourself  0 2  Trouble concentrating 1 1  Moving slowly or fidgety/restless 0 1  Suicidal thoughts 0 0  PHQ-9 Score 5 12  Difficult doing work/chores Somewhat difficult Somewhat difficult   Interpretation of Total Score  Total Score Depression Severity:  1-4 = Minimal depression, 5-9 = Mild depression, 10-14 = Moderate depression, 15-19 = Moderately severe depression, 20-27 = Severe depression   Psychosocial Evaluation and Intervention:  Psychosocial Evaluation - 05/12/23 1612       Psychosocial Evaluation & Interventions   Interventions Stress management education;Relaxation education;Encouraged to exercise with the program and follow exercise prescription    Comments Nicholas Andersen is coming into Pulmonary Rehab with asthma.  He had done rehab before after his heart attack.  He is worried about finances and affording gas to get here.  He is currently applying for disability but has yet to get any payment yet.  He has also been out of work so no money has been coming in recently.  He is starting with 2 days a week to help with affordability.  If he has to go back to work, that will interfere with his scheduling as well.  He has a great support system in his wife, kids, 3 brothers, and neighbors.  He states  that everyone keeps checking up on him to make sure he is good.  He sleeps well with his CPAP and is very consistent with it.  He usually sleeps about 6-7 hrs but recently only been getting 4-5 hrs from being up with prednisone again.  He has gained almost 60 lbs since starting prednisone in February and really would like to lose the weight again.  He is looking forward to improving his breathing and overall mobility, especially if he can get the weight back under better  control.  Back in his 30s when this flared up the first time, he gained 80 lb and was able to get it back off.    Expected Outcomes Short: Attend rehab regularly to build up strength and breathing Long: Work on weight loss with exercise    Continue Psychosocial Services  Follow up required by staff             Psychosocial Re-Evaluation:  Psychosocial Re-Evaluation     Row Name 06/03/23 1140 06/23/23 1113           Psychosocial Re-Evaluation   Current issues with Current Sleep Concerns;Current Stress Concerns Current Sleep Concerns;Current Stress Concerns      Comments Nicholas Andersen is doing well in rehab.  He rates his stress around medium due to his finances.  Since applying for disability he is not able to get paid as much and it is hard on him.  He also continues to have issues with sleep but did sleep well last night.  He is enjoying Christmas baking and discovering his mom's recipes that had been forgotten.  He is baking a lot currently and shares that love wiht his brothers. Nicholas Andersen is doing well in rehab.  He is feeling better overall.  He is still struggling with sleep and got 6 hrs with his CPAP last night, but it was not continuos.  He did not feel great yesterday.  His PHQ has greatly improved from a 12 down to a 5.  A lot comes from not sleeping well and not feeling like eating.  He is still dealing with work disability issues and not getting paid.  Mena Pauls is not going to pay for his cancer treatments and he is working on getting his coverage back.      Expected Outcomes Short: Enjoy Christmas baking Long; Conitnue to work on sleep Short: Continue to sort through insurance issues Long: Conitnue to work on sleep      Interventions Encouraged to attend Pulmonary Rehabilitation for the exercise Encouraged to attend Pulmonary Rehabilitation for the exercise;Stress management education      Continue Psychosocial Services  Follow up required by staff --               Psychosocial  Discharge (Final Psychosocial Re-Evaluation):  Psychosocial Re-Evaluation - 06/23/23 1113       Psychosocial Re-Evaluation   Current issues with Current Sleep Concerns;Current Stress Concerns    Comments Nicholas Andersen is doing well in rehab.  He is feeling better overall.  He is still struggling with sleep and got 6 hrs with his CPAP last night, but it was not continuos.  He did not feel great yesterday.  His PHQ has greatly improved from a 12 down to a 5.  A lot comes from not sleeping well and not feeling like eating.  He is still dealing with work disability issues and not getting paid.  Mena Pauls is not going to pay for his cancer treatments and he is working  on getting his coverage back.    Expected Outcomes Short: Continue to sort through insurance issues Long: Conitnue to work on sleep    Interventions Encouraged to attend Pulmonary Rehabilitation for the exercise;Stress management education              Education: Education Goals: Education classes will be provided on a weekly basis, covering required topics. Participant will state understanding/return demonstration of topics presented.  Learning Barriers/Preferences:  Learning Barriers/Preferences - 05/12/23 1233       Learning Barriers/Preferences   Learning Barriers Sight   glasses for reading   Learning Preferences Skilled Demonstration             Education Topics: How Lungs Work and Diseases: - Discuss the anatomy of the lungs and diseases that can affect the lungs, such as COPD.   Exercise: -Discuss the importance of exercise, FITT principles of exercise, normal and abnormal responses to exercise, and how to exercise safely.   Environmental Irritants: -Discuss types of environmental irritants and how to limit exposure to environmental irritants.   Meds/Inhalers and oxygen: - Discuss respiratory medications, definition of an inhaler and oxygen, and the proper way to use an inhaler and oxygen.   Energy Saving  Techniques: - Discuss methods to conserve energy and decrease shortness of breath when performing activities of daily living.    Bronchial Hygiene / Breathing Techniques: - Discuss breathing mechanics, pursed-lip breathing technique,  proper posture, effective ways to clear airways, and other functional breathing techniques   Cleaning Equipment: - Provides group verbal and written instruction about the health risks of elevated stress, cause of high stress, and healthy ways to reduce stress.   Nutrition I: Fats: - Discuss the types of cholesterol, what cholesterol does to the body, and how cholesterol levels can be controlled. Flowsheet Row PULMONARY REHAB OTHER RESPIRATORY from 07/17/2023 in [facility]  Date 05/29/23  Educator hb.  Instruction Review Code 1- Verbalizes Understanding       Nutrition II: Labels: -Discuss the different components of food labels and how to read food labels. Flowsheet Row PULMONARY REHAB OTHER RESPIRATORY from 07/17/2023 in [place] [facility]  Date 05/29/23  Educator HB  Instruction Review Code 1- Verbalizes Understanding       Respiratory Infections: - Discuss the signs and symptoms of respiratory infections, ways to prevent respiratory infections, and the importance of seeking medical treatment when having a respiratory infection. Flowsheet Row PULMONARY REHAB OTHER RESPIRATORY from 07/17/2023 in [place] [facility]  Date 07/17/23  Educator HB  Instruction Review Code 1- Verbalizes Understanding       Stress I: Signs and Symptoms: - Discuss the causes of stress, how stress may lead to anxiety and depression, and ways to limit stress. Flowsheet Row PULMONARY REHAB OTHER RESPIRATORY from 07/17/2023 in [place] [facility]  Date 06/19/23  Educator hb  Instruction Review Code 1- Verbalizes Understanding       Stress II: Relaxation: -Discuss relaxation techniques to limit  stress.   Oxygen for Home/Travel: - Discuss how to prepare for travel when on oxygen and proper ways to transport and store oxygen to ensure safety.   Knowledge Questionnaire Score:  Knowledge Questionnaire Score - 05/12/23 1621       Knowledge Questionnaire Score   Pre Score 16/18             Core Components/Risk Factors/Patient Goals at Admission:  Personal Goals and Risk Factors at Admission - 05/12/23 1621  Core Components/Risk Factors/Patient Goals on Admission    Weight Management Yes;Obesity;Weight Loss    Intervention Weight Management: Develop a combined nutrition and exercise program designed to reach desired caloric intake, while maintaining appropriate intake of nutrient and fiber, sodium and fats, and appropriate energy expenditure required for the weight goal.;Weight Management: Provide education and appropriate resources to help participant work on and attain dietary goals.;Weight Management/Obesity: Establish reasonable short term and long term weight goals.;Obesity: Provide education and appropriate resources to help participant work on and attain dietary goals.    Admit Weight 285 lb 9.6 oz (129.5 kg)    Goal Weight: Short Term 280 lb (127 kg)    Goal Weight: Long Term 275 lb (124.7 kg)    Expected Outcomes Short Term: Continue to assess and modify interventions until short term weight is achieved;Long Term: Adherence to nutrition and physical activity/exercise program aimed toward attainment of established weight goal;Weight Loss: Understanding of general recommendations for a balanced deficit meal plan, which promotes 1-2 lb weight loss per week and includes a negative energy balance of [phone] kcal/d;Understanding recommendations for meals to include 15-35% energy as protein, 25-35% energy from fat, 35-60% energy from carbohydrates, less than 200mg  of dietary cholesterol, 20-35 gm of total fiber daily;Understanding of distribution of calorie intake throughout  the day with the consumption of 4-5 meals/snacks    Improve shortness of breath with ADL's Yes    Intervention Provide education, individualized exercise plan and daily activity instruction to help decrease symptoms of SOB with activities of daily living.    Expected Outcomes Short Term: Improve cardiorespiratory fitness to achieve a reduction of symptoms when performing ADLs;Long Term: Be able to perform more ADLs without symptoms or delay the onset of symptoms    Increase knowledge of respiratory medications and ability to use respiratory devices properly  Yes    Intervention Provide education and demonstration as needed of appropriate use of medications, inhalers, and oxygen therapy.    Expected Outcomes Short Term: Achieves understanding of medications use. Understands that oxygen is a medication prescribed by physician. Demonstrates appropriate use of inhaler and oxygen therapy.;Long Term: Maintain appropriate use of medications, inhalers, and oxygen therapy.    Hypertension Yes    Intervention Provide education on lifestyle modifcations including regular physical activity/exercise, weight management, moderate sodium restriction and increased consumption of fresh fruit, vegetables, and low fat dairy, alcohol moderation, and smoking cessation.;Monitor prescription use compliance.    Expected Outcomes Short Term: Continued assessment and intervention until BP is < 140/34mm HG in hypertensive participants. < 130/6mm HG in hypertensive participants with diabetes, heart failure or chronic kidney disease.;Long Term: Maintenance of blood pressure at goal levels.    Lipids Yes    Intervention Provide education and support for participant on nutrition & aerobic/resistive exercise along with prescribed medications to achieve LDL 70mg , HDL >40mg .    Expected Outcomes Short Term: Participant states understanding of desired cholesterol values and is compliant with medications prescribed. Participant is  following exercise prescription and nutrition guidelines.;Long Term: Cholesterol controlled with medications as prescribed, with individualized exercise RX and with personalized nutrition plan. Value goals: LDL < 70mg , HDL > 40 mg.             Core Components/Risk Factors/Patient Goals Review:   Goals and Risk Factor Review     Row Name 06/03/23 1144 06/23/23 1118           Core Components/Risk Factors/Patient Goals Review   Personal Goals Review Weight Management/Obesity;Improve shortness of breath  with ADL's;Increase knowledge of respiratory medications and ability to use respiratory devices properly.;Hypertension Weight Management/Obesity;Improve shortness of breath with ADL's;Increase knowledge of respiratory medications and ability to use respiratory devices properly.;Hypertension      Review Nicholas Andersen is doing well in rehab.  He still wants to lose weight but steriods are making it tough.  He had lost on wengovy previously but insurance stopped paying for it and he started steroirs and up his weight went again.  He is pressures are good and his breathing has been improving.  He has good days and bad days with breathing but more good since starting rehab. Nicholas Andersen is doing well in rehab.  His breathing is getting better and he is able to do more at home now than when he started the program.  His weight is holding stready still.  He wants it to go down more.  His pressures are doing well and he is doing well with his medications as well.      Expected Outcomes Short: COnitnue to work on weight loss Long: Conitnue to monitor risk factors. Short: Conintue to work on getting weight down Long: Conitnue to work on breathing.               Core Components/Risk Factors/Patient Goals at Discharge (Final Review):   Goals and Risk Factor Review - 06/23/23 1118       Core Components/Risk Factors/Patient Goals Review   Personal Goals Review Weight Management/Obesity;Improve shortness of breath  with ADL's;Increase knowledge of respiratory medications and ability to use respiratory devices properly.;Hypertension    Review Nicholas Andersen is doing well in rehab.  His breathing is getting better and he is able to do more at home now than when he started the program.  His weight is holding stready still.  He wants it to go down more.  His pressures are doing well and he is doing well with his medications as well.    Expected Outcomes Short: Conintue to work on getting weight down Long: Conitnue to work on breathing.             ITP Comments:  ITP Comments     Row Name 05/12/23 1606 05/13/23 1504 05/14/23 1306 06/10/23 1441 07/09/23 1144   ITP Comments Patient attend orientation today.  Patient is attending Pulmonary Rehabilitation Program.  Documentation for diagnosis can be found in [facility] media tab for Cardiac Rehab.  Reviewed medical chart, RPE/RPD, gym safety, and program guidelines.  Patient was fitted to equipment they will be using during rehab.  Patient is scheduled to start exercise on Tuesday 05/13/23 at 3pm.   Initial ITP created and sent for review and signature by Dr. Erick Blinks, Medical Director for Pulmonary Rehabilitation Program. irst full day of exercise!  Patient was oriented to gym and equipment including functions, settings, policies, and procedures.  Patient's individual exercise prescription and treatment plan were reviewed.  All starting workloads were established based on the results of the 6 minute walk test done at initial orientation visit.  The plan for exercise progression was also introduced and progression will be customized based on patient's performance and goals. 30 day review completed. ITP sent to Dr.Jehanzeb Memon, Medical Director of  Pulmonary Rehab. Continue with ITP unless changes are made by physician.  New to program only completed orientation and 1 exercise session thus far. 30 day review completed. ITP sent to Dr.Jehanzeb Memon, Medical Director of  Pulmonary  Rehab. Continue with ITP unless changes are made by physician. 30 day  review completed. ITP sent to Dr.Jehanzeb Memon, Medical Director of  Pulmonary Rehab. Continue with ITP unless changes are made by physician.    Row Name 08/06/23 1405           ITP Comments 30 day review completed. ITP sent to Dr.Jehanzeb Memon, Medical Director of  Pulmonary Rehab. Continue with ITP unless changes are made by physician. Patient has not attended since 07/17/23.  His MD notes indicate that he should continue to attend rehab.  Call attempts have been made regaurding return to rehab.                Comments: 30 day review

## 2023-08-07 ENCOUNTER — Encounter (HOSPITAL_COMMUNITY): Payer: BC Managed Care – PPO

## 2023-08-12 ENCOUNTER — Encounter (HOSPITAL_COMMUNITY): Payer: BC Managed Care – PPO | Attending: Pulmonary Disease

## 2023-08-12 DIAGNOSIS — J4531 Mild persistent asthma with (acute) exacerbation: Secondary | ICD-10-CM | POA: Insufficient documentation

## 2023-08-14 ENCOUNTER — Encounter (HOSPITAL_COMMUNITY): Payer: BC Managed Care – PPO

## 2023-08-19 ENCOUNTER — Telehealth (HOSPITAL_COMMUNITY): Payer: Self-pay

## 2023-08-19 ENCOUNTER — Encounter (HOSPITAL_COMMUNITY): Payer: BC Managed Care – PPO

## 2023-08-21 ENCOUNTER — Encounter (HOSPITAL_COMMUNITY): Payer: BC Managed Care – PPO

## 2023-08-26 ENCOUNTER — Encounter (HOSPITAL_COMMUNITY): Payer: BC Managed Care – PPO

## 2023-08-28 ENCOUNTER — Encounter (HOSPITAL_COMMUNITY): Payer: BC Managed Care – PPO

## 2023-09-02 ENCOUNTER — Telehealth: Payer: Self-pay | Admitting: Nurse Practitioner

## 2023-09-02 ENCOUNTER — Encounter (HOSPITAL_COMMUNITY): Payer: BC Managed Care – PPO

## 2023-09-02 NOTE — Telephone Encounter (Signed)
 I have short-term disability papers for this patient.  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  I recommend he follow-up with his PCP and neurologist if he is wanting short-term disability from them.   Thanks!   Best,  Sharlene Dory, NP

## 2023-09-03 ENCOUNTER — Encounter (HOSPITAL_COMMUNITY): Payer: Self-pay | Admitting: *Deleted

## 2023-09-03 DIAGNOSIS — J4531 Mild persistent asthma with (acute) exacerbation: Secondary | ICD-10-CM

## 2023-09-03 NOTE — Progress Notes (Signed)
 Pulmonary Individual Treatment Plan  Patient Details  Name: Nicholas Andersen MRN: [id number] Date of Birth: 1961-02-27 Referring Provider:   Flowsheet Row PULMONARY REHAB OTHER RESP ORIENTATION from 05/12/2023 in [facility]  Referring Provider Merlyn Albert MD       Initial Encounter Date:  Flowsheet Row PULMONARY REHAB OTHER RESP ORIENTATION from 05/12/2023 in [place] CARDIAC REHABILITATION  Date 05/12/23       Visit Diagnosis: Mild persistent asthma with acute exacerbation  Patient's Home Medications on Admission:   Current Outpatient Medications:    allopurinol (ZYLOPRIM) 100 MG tablet, Take 100 mg by mouth daily., Disp: , Rfl:    ALPRAZolam (XANAX) 1 MG tablet, 1 mg as needed., Disp: , Rfl:    amLODipine-benazepril (LOTREL) 10-20 MG capsule, Take 1 capsule by mouth daily., Disp: , Rfl:    aspirin EC 81 MG tablet, Take 81 mg by mouth daily. Swallow whole., Disp: , Rfl:    atorvastatin (LIPITOR) 80 MG tablet, Take 80 mg by mouth daily., Disp: , Rfl:    baclofen (LIORESAL) 10 MG tablet, Take 10 mg by mouth 2 (two) times daily as needed., Disp: , Rfl:    BREO ELLIPTA 200-25 MCG/ACT AEPB, Inhale 1 puff into the lungs daily., Disp: , Rfl:    cetirizine (ZYRTEC) 10 MG tablet, Take 10 mg by mouth as needed., Disp: , Rfl:    Cholecalciferol (VITAMIN D3) 50 MCG (2000 UT) TABS, Take 1 tablet by mouth daily., Disp: , Rfl:    Efgartigimod alfa-fcab (VYVGART IV), Inject into the vein every 30 (thirty) days., Disp: , Rfl:    famotidine (PEPCID) 20 MG tablet, Take 1 tablet by mouth 2 (two) times daily., Disp: , Rfl:    Misc. Devices MISC, by Does not apply route. CPAP, Disp: , Rfl:    Multiple Vitamin (MULTIVITAMIN WITH MINERALS) TABS tablet, Take 1 tablet by mouth daily., Disp: , Rfl:    mycophenolate (CELLCEPT) 500 MG tablet, Take 3 tabs by mouth every morning & 2 tabs every evening, Disp: , Rfl:    nitroGLYCERIN (NITROSTAT) 0.4 MG SL tablet, Place 1 tablet (0.4  mg total) under the tongue every 5 (five) minutes x 3 doses as needed for chest pain., Disp: 25 tablet, Rfl: 2   predniSONE (DELTASONE) 10 MG tablet, Take 10 mg by mouth daily., Disp: , Rfl:    pyridostigmine (MESTINON) 60 MG tablet, Take 30 mg by mouth 2 (two) times daily., Disp: , Rfl:    spironolactone (ALDACTONE) 25 MG tablet, Take 1 tablet (25 mg total) by mouth daily., Disp: 90 tablet, Rfl: 3  Past Medical History: Past Medical History:  Diagnosis Date   Asthma    Coronary artery disease    Gout    Hypercholesterolemia    Hypertension     Tobacco Use: Social History   Tobacco Use  Smoking Status Former   Current packs/day: 0.00   Average packs/day: 1 pack/day for 15.0 years (15.0 ttl pk-yrs)   Types: Cigarettes   Start date: 59   Quit date: 3   Years since quitting: 35.2   Passive exposure: Never  Smokeless Tobacco Never    Labs: Review Flowsheet       Latest Ref Rng & Units 12/19/2020 04/18/2021  Labs for ITP Cardiac and Pulmonary Rehab  Cholestrol 0 - 200 mg/dL 469  629   LDL (calc) 0 - 99 mg/dL 92  70   HDL-C >52 mg/dL 47  44   Trlycerides <841 mg/dL 324  62   Hemoglobin A1c 4.8 - 5.6 % 5.5  -    Capillary Blood Glucose: No results found for: "GLUCAP"   Pulmonary Assessment Scores:  Pulmonary Assessment Scores     Row Name 05/12/23 1622         ADL UCSD   ADL Phase Entry     SOB Score total 53     Rest 1     Walk 2     Stairs 4     Bath 4     Dress 4     Shop 2       CAT Score   CAT Score 23       mMRC Score   mMRC Score 1             UCSD: Self-administered rating of dyspnea associated with activities of daily living (ADLs) 6-point scale (0 = "not at all" to 5 = "maximal or unable to do because of breathlessness")  Scoring Scores range from 0 to 120.  Minimally important difference is 5 units  CAT: CAT can identify the health impairment of COPD patients and is better correlated with disease progression.  CAT has a scoring  range of zero to 40. The CAT score is classified into four groups of low (less than 10), medium (10 - 20), high (21-30) and very high (31-40) based on the impact level of disease on health status. A CAT score over 10 suggests significant symptoms.  A worsening CAT score could be explained by an exacerbation, poor medication adherence, poor inhaler technique, or progression of COPD or comorbid conditions.  CAT MCID is 2 points  mMRC: mMRC (Modified Medical Research Council) Dyspnea Scale is used to assess the degree of baseline functional disability in patients of respiratory disease due to dyspnea. No minimal important difference is established. A decrease in score of 1 point or greater is considered a positive change.   Pulmonary Function Assessment:  Pulmonary Function Assessment - 05/12/23 1622       Breath   Shortness of Breath Yes;Fear of Shortness of Breath;Limiting activity             Exercise Target Goals: Exercise Program Goal: Individual exercise prescription set using results from initial 6 min walk test and THRR while considering  patient's activity barriers and safety.   Exercise Prescription Goal: Initial exercise prescription builds to 30-45 minutes a day of aerobic activity, 2-3 days per week.  Home exercise guidelines will be given to patient during program as part of exercise prescription that the participant will acknowledge.  Activity Barriers & Risk Stratification:  Activity Barriers & Cardiac Risk Stratification - 05/12/23 1239       Activity Barriers & Cardiac Risk Stratification   Activity Barriers Deconditioning;Muscular Weakness;Shortness of Breath;Back Problems;Neck/Spine Problems;History of Falls;Other (comment)   buldging disc in low back   Comments Myasthenia Gravis (x2 yrs)             6 Minute Walk:  6 Minute Walk     Row Name 05/12/23 1608         6 Minute Walk   Phase Initial     Distance 1280 feet     Walk Time 6 minutes     # of  Rest Breaks 0     MPH 2.42     METS 3.05     RPE 13     Perceived Dyspnea  2     VO2 Peak 10.67  Symptoms No     Resting HR 53 bpm     Resting BP 132/74     Resting Oxygen Saturation  97 %     Exercise Oxygen Saturation  during 6 min walk 94 %     Max Ex. HR 122 bpm     Max Ex. BP 164/84     2 Minute Post BP 152/70       Interval HR   1 Minute HR 84     2 Minute HR 96     3 Minute HR 115     4 Minute HR 122     5 Minute HR 118     6 Minute HR 113     2 Minute Post HR 53     Interval Heart Rate? Yes       Interval Oxygen   Interval Oxygen? Yes     Baseline Oxygen Saturation % 97 %     1 Minute Oxygen Saturation % 94 %     1 Minute Liters of Oxygen 0 L  Room Air     2 Minute Oxygen Saturation % 94 %     2 Minute Liters of Oxygen 0 L     3 Minute Oxygen Saturation % 95 %     3 Minute Liters of Oxygen 0 L     4 Minute Oxygen Saturation % 95 %     4 Minute Liters of Oxygen 0 L     5 Minute Oxygen Saturation % 96 %     5 Minute Liters of Oxygen 0 L     6 Minute Oxygen Saturation % 96 %     6 Minute Liters of Oxygen 0 L     2 Minute Post Oxygen Saturation % 97 %     2 Minute Post Liters of Oxygen 0 L              Oxygen Initial Assessment:  Oxygen Initial Assessment - 05/12/23 1244       Home Oxygen   Home Oxygen Device None    Sleep Oxygen Prescription CPAP;None    Home Exercise Oxygen Prescription None    Home Resting Oxygen Prescription None    Compliance with Home Oxygen Use Yes      Initial 6 min Walk   Oxygen Used None      Program Oxygen Prescription   Program Oxygen Prescription None      Intervention   Short Term Goals To learn and exhibit compliance with exercise, home and travel O2 prescription;To learn and understand importance of monitoring SPO2 with pulse oximeter and demonstrate accurate use of the pulse oximeter.;To learn and understand importance of maintaining oxygen saturations>88%;To learn and demonstrate proper use of respiratory  medications;To learn and demonstrate proper pursed lip breathing techniques or other breathing techniques.     Long  Term Goals Exhibits compliance with exercise, home  and travel O2 prescription;Maintenance of O2 saturations>88%;Compliance with respiratory medication;Verbalizes importance of monitoring SPO2 with pulse oximeter and return demonstration;Exhibits proper breathing techniques, such as pursed lip breathing or other method taught during program session;Demonstrates proper use of MDI's             Oxygen Re-Evaluation:  Oxygen Re-Evaluation     Row Name 06/03/23 1145 06/23/23 1119           Program Oxygen Prescription   Program Oxygen Prescription None None        Home Oxygen   Home Oxygen  Device None None      Sleep Oxygen Prescription None;CPAP None;CPAP      Home Exercise Oxygen Prescription None None      Home Resting Oxygen Prescription None None      Compliance with Home Oxygen Use Yes Yes        Goals/Expected Outcomes   Short Term Goals To learn and exhibit compliance with exercise, home and travel O2 prescription;To learn and understand importance of monitoring SPO2 with pulse oximeter and demonstrate accurate use of the pulse oximeter.;To learn and understand importance of maintaining oxygen saturations>88%;To learn and demonstrate proper use of respiratory medications;To learn and demonstrate proper pursed lip breathing techniques or other breathing techniques.  To learn and exhibit compliance with exercise, home and travel O2 prescription;To learn and understand importance of monitoring SPO2 with pulse oximeter and demonstrate accurate use of the pulse oximeter.;To learn and understand importance of maintaining oxygen saturations>88%;To learn and demonstrate proper use of respiratory medications;To learn and demonstrate proper pursed lip breathing techniques or other breathing techniques.       Long  Term Goals Exhibits compliance with exercise, home  and travel O2  prescription;Maintenance of O2 saturations>88%;Compliance with respiratory medication;Verbalizes importance of monitoring SPO2 with pulse oximeter and return demonstration;Exhibits proper breathing techniques, such as pursed lip breathing or other method taught during program session;Demonstrates proper use of MDI's Exhibits compliance with exercise, home  and travel O2 prescription;Maintenance of O2 saturations>88%;Compliance with respiratory medication;Verbalizes importance of monitoring SPO2 with pulse oximeter and return demonstration;Exhibits proper breathing techniques, such as pursed lip breathing or other method taught during program session;Demonstrates proper use of MDI's      Comments Nicholas Andersen is doing well in rehab.  He is compliant wiht his CPAP at night, but still doesn't sleep well.  His breathing is getting better and he uses his inhalers as needed.  He is feeling better overall. Nicholas Andersen is doing well in rehab.  He is compliant with his CPAP and uses it each night.  He as been working on his breathing and it is getting better.  He continues to monitor his saturations at home and will take it with him to exercise.      Goals/Expected Outcomes Short: Continue to use PLB Long: Continued to improve breathing. Short: Continue to use PLB  long Continued compliance with CPAP               Oxygen Discharge (Final Oxygen Re-Evaluation):  Oxygen Re-Evaluation - 06/23/23 1119       Program Oxygen Prescription   Program Oxygen Prescription None      Home Oxygen   Home Oxygen Device None    Sleep Oxygen Prescription None;CPAP    Home Exercise Oxygen Prescription None    Home Resting Oxygen Prescription None    Compliance with Home Oxygen Use Yes      Goals/Expected Outcomes   Short Term Goals To learn and exhibit compliance with exercise, home and travel O2 prescription;To learn and understand importance of monitoring SPO2 with pulse oximeter and demonstrate accurate use of the pulse  oximeter.;To learn and understand importance of maintaining oxygen saturations>88%;To learn and demonstrate proper use of respiratory medications;To learn and demonstrate proper pursed lip breathing techniques or other breathing techniques.     Long  Term Goals Exhibits compliance with exercise, home  and travel O2 prescription;Maintenance of O2 saturations>88%;Compliance with respiratory medication;Verbalizes importance of monitoring SPO2 with pulse oximeter and return demonstration;Exhibits proper breathing techniques, such as pursed lip breathing or other  method taught during program session;Demonstrates proper use of MDI's    Comments Nicholas Andersen is doing well in rehab.  He is compliant with his CPAP and uses it each night.  He as been working on his breathing and it is getting better.  He continues to monitor his saturations at home and will take it with him to exercise.    Goals/Expected Outcomes Short: Continue to use PLB  long Continued compliance with CPAP             Initial Exercise Prescription:  Initial Exercise Prescription - 05/12/23 1600       Date of Initial Exercise RX and Referring Provider   Date 05/12/23    Referring Provider Alghrim, Rondall Allegra MD      Oxygen   Maintain Oxygen Saturation 88% or higher      Treadmill   MPH 2.3    Grade 0.5    Minutes 15    METs 2.92      REL-XR   Level 3    Speed 50    Minutes 15    METs 2.5      Prescription Details   Frequency (times per week) 2    Duration Progress to 30 minutes of continuous aerobic without signs/symptoms of physical distress      Intensity   THRR 40-80% of Max Heartrate 95-137    Ratings of Perceived Exertion 11-13    Perceived Dyspnea 0-4      Progression   Progression Continue to progress workloads to maintain intensity without signs/symptoms of physical distress.      Resistance Training   Training Prescription Yes    Weight 3 lb    Reps 10-15             Perform Capillary Blood Glucose  checks as needed.  Exercise Prescription Changes:   Exercise Prescription Changes     Row Name 05/12/23 1600 05/22/23 1500 06/03/23 1200 06/10/23 1500 06/23/23 1100     Response to Exercise   Blood Pressure (Admit) 132/74 120/68 122/65 126/72 --   Blood Pressure (Exercise) 164/84 162/68 -- 124/65 --   Blood Pressure (Exit) 142/72 126/68 126/64 132/78 --   Heart Rate (Admit) 53 bpm 65 bpm 70 bpm 84 bpm --   Heart Rate (Exercise) 122 bpm 110 bpm 110 bpm 95 bpm --   Heart Rate (Exit) 63 bpm 85 bpm 85 bpm 77 bpm --   Oxygen Saturation (Admit) 97 % 96 % 95 % 96 % --   Oxygen Saturation (Exercise) 94 % 95 % 96 % 94 % --   Oxygen Saturation (Exit) 96 % 97 % 97 % 94 % --   Rating of Perceived Exertion (Exercise) 13 13 13 13  --   Perceived Dyspnea (Exercise) 2 3 2 1  --   Symptoms SOB sob -- -- --   Comments walk test results -- -- -- --   Duration -- Continue with 30 min of aerobic exercise without signs/symptoms of physical distress. Continue with 30 min of aerobic exercise without signs/symptoms of physical distress. Continue with 30 min of aerobic exercise without signs/symptoms of physical distress. --   Intensity -- THRR unchanged THRR unchanged THRR unchanged --     Progression   Progression -- Continue to progress workloads to maintain intensity without signs/symptoms of physical distress. Continue to progress workloads to maintain intensity without signs/symptoms of physical distress. Continue to progress workloads to maintain intensity without signs/symptoms of physical distress. --     Resistance  Training   Training Prescription -- Yes Yes Yes --   Weight -- 4 lbs 4 lbs 4 lbs --   Reps -- 10-15 10-15 10-15 --     Treadmill   MPH -- 1.7 1.8 1.8 --   Grade -- 0 0 0 --   Minutes -- 15 15 15  --   METs -- 2.3 2.38 2.38 --     REL-XR   Level -- 4 6 5  --   Speed -- 60 58 66 --   Minutes -- 15 15 15  --   METs -- 2.9 5 4.5 --     Home Exercise Plan   Plans to continue exercise  at -- -- -- -- Home (comment)  walking with dog and brother at mall   Frequency -- -- -- -- Add 3 additional days to program exercise sessions.   Initial Home Exercises Provided -- -- -- -- 06/23/23     Oxygen   Maintain Oxygen Saturation -- 88% or higher 88% or higher 88% or higher --    Row Name 06/23/23 1300 07/03/23 1500           Response to Exercise   Blood Pressure (Admit) 136/62 102/60      Blood Pressure (Exit) 116/62 120/60      Heart Rate (Admit) 70 bpm 71 bpm      Heart Rate (Exercise) 94 bpm 109 bpm      Heart Rate (Exit) 72 bpm 47 bpm      Oxygen Saturation (Admit) 97 % 97 %      Oxygen Saturation (Exercise) 95 % 95 %      Oxygen Saturation (Exit) 96 % 96 %      Rating of Perceived Exertion (Exercise) 13 13      Perceived Dyspnea (Exercise) 2 3      Duration Continue with 30 min of aerobic exercise without signs/symptoms of physical distress. Continue with 30 min of aerobic exercise without signs/symptoms of physical distress.      Intensity THRR unchanged THRR unchanged        Progression   Progression Continue to progress workloads to maintain intensity without signs/symptoms of physical distress. Continue to progress workloads to maintain intensity without signs/symptoms of physical distress.        Resistance Training   Training Prescription Yes Yes      Weight 4 4      Reps 10-15 10-15        Treadmill   MPH 2 1.6      Grade 2 0      Minutes 15 15      METs 3.08 2.23        REL-XR   Level 5 5      Speed 48 46      Minutes 15 15      METs 1.7 4.1        Home Exercise Plan   Plans to continue exercise at Home (comment) Home (comment)      Frequency Add 3 additional days to program exercise sessions. Add 3 additional days to program exercise sessions.        Oxygen   Maintain Oxygen Saturation 88% or higher 88% or higher               Exercise Comments:   Exercise Goals and Review:   Exercise Goals     Row Name 05/12/23 1611  Exercise Goals   Increase Physical Activity Yes       Intervention Provide advice, education, support and counseling about physical activity/exercise needs.;Develop an individualized exercise prescription for aerobic and resistive training based on initial evaluation findings, risk stratification, comorbidities and participant's personal goals.       Expected Outcomes Short Term: Attend rehab on a regular basis to increase amount of physical activity.;Long Term: Add in home exercise to make exercise part of routine and to increase amount of physical activity.;Long Term: Exercising regularly at least 3-5 days a week.       Increase Strength and Stamina Yes       Intervention Provide advice, education, support and counseling about physical activity/exercise needs.;Develop an individualized exercise prescription for aerobic and resistive training based on initial evaluation findings, risk stratification, comorbidities and participant's personal goals.       Expected Outcomes Short Term: Increase workloads from initial exercise prescription for resistance, speed, and METs.;Short Term: Perform resistance training exercises routinely during rehab and add in resistance training at home;Long Term: Improve cardiorespiratory fitness, muscular endurance and strength as measured by increased METs and functional capacity ( )       Able to understand and use rate of perceived exertion (RPE) scale Yes       Intervention Provide education and explanation on how to use RPE scale       Expected Outcomes Short Term: Able to use RPE daily in rehab to express subjective intensity level;Long Term:  Able to use RPE to guide intensity level when exercising independently       Able to understand and use Dyspnea scale Yes       Intervention Provide education and explanation on how to use Dyspnea scale       Expected Outcomes Short Term: Able to use Dyspnea scale daily in rehab to express subjective sense of shortness  of breath during exertion;Long Term: Able to use Dyspnea scale to guide intensity level when exercising independently       Knowledge and understanding of Target Heart Rate Range (THRR) Yes       Intervention Provide education and explanation of THRR including how the numbers were predicted and where they are located for reference       Expected Outcomes Short Term: Able to state/look up THRR;Long Term: Able to use THRR to govern intensity when exercising independently;Short Term: Able to use daily as guideline for intensity in rehab       Able to check pulse independently Yes       Intervention Review the importance of being able to check your own pulse for safety during independent exercise;Provide education and demonstration on how to check pulse in carotid and radial arteries.       Expected Outcomes Short Term: Able to explain why pulse checking is important during independent exercise;Long Term: Able to check pulse independently and accurately       Understanding of Exercise Prescription Yes       Intervention Provide education, explanation, and written materials on patient's individual exercise prescription       Expected Outcomes Short Term: Able to explain program exercise prescription;Long Term: Able to explain home exercise prescription to exercise independently                Exercise Goals Re-Evaluation :  Exercise Goals Re-Evaluation     Row Name 06/03/23 1114 06/23/23 1107           Exercise Goal Re-Evaluation   Exercise  Goals Review Increase Physical Activity;Increase Strength and Stamina;Understanding of Exercise Prescription Increase Physical Activity;Increase Strength and Stamina;Able to understand and use rate of perceived exertion (RPE) scale;Able to understand and use Dyspnea scale;Knowledge and understanding of Target Heart Rate Range (THRR);Able to check pulse independently;Understanding of Exercise Prescription      Comments Nicholas Andersen is doing well in rehab.  He is  not doing much at home yet.  We will review home exercise guidelines with him soon.  He is starting to feel like his stamina is improving and he is gettting his strength back. Nicholas Andersen is doing well in rehab.  He is feeling pretty good overall.  He has been staying active at home with ADLs and walking his dog.  He has already noticed a difference in his ability to do his ADLs at home.  Pt plans to walk at home with dog and join brother at mall to walk for exercise.  Reviewed THR, pulse, RPE, sign and symptoms, pulse oximetery and when to call 911 or MD.  Also discussed weather considerations and indoor options.  Pt voiced understanding.      Expected Outcomes Short; review home exercise Long: Conitnue to exercise to build stamina Short: Start to walk with brother at mall Long; Continue to improve stamina.               Discharge Exercise Prescription (Final Exercise Prescription Changes):  Exercise Prescription Changes - 07/03/23 1500       Response to Exercise   Blood Pressure (Admit) 102/60    Blood Pressure (Exit) 120/60    Heart Rate (Admit) 71 bpm    Heart Rate (Exercise) 109 bpm    Heart Rate (Exit) 47 bpm    Oxygen Saturation (Admit) 97 %    Oxygen Saturation (Exercise) 95 %    Oxygen Saturation (Exit) 96 %    Rating of Perceived Exertion (Exercise) 13    Perceived Dyspnea (Exercise) 3    Duration Continue with 30 min of aerobic exercise without signs/symptoms of physical distress.    Intensity THRR unchanged      Progression   Progression Continue to progress workloads to maintain intensity without signs/symptoms of physical distress.      Resistance Training   Training Prescription Yes    Weight 4    Reps 10-15      Treadmill   MPH 1.6    Grade 0    Minutes 15    METs 2.23      REL-XR   Level 5    Speed 46    Minutes 15    METs 4.1      Home Exercise Plan   Plans to continue exercise at Home (comment)    Frequency Add 3 additional days to program exercise  sessions.      Oxygen   Maintain Oxygen Saturation 88% or higher             Nutrition:  Target Goals: Understanding of nutrition guidelines, daily intake of sodium 1500mg , cholesterol 200mg , calories 30% from fat and 7% or less from saturated fats, daily to have 5 or more servings of fruits and vegetables.  Biometrics:  Pre Biometrics - 05/12/23 1612       Pre Biometrics   Height 5' 10.5" (1.791 m)    Weight 285 lb 9.6 oz (129.5 kg)    Waist Circumference 48 inches    Hip Circumference 45.5 inches    Waist to Hip Ratio 1.05 %  BMI (Calculated) 40.39    Grip Strength 47.3 kg    Single Leg Stand 23.8 seconds              Nutrition Therapy Plan and Nutrition Goals:  Nutrition Therapy & Goals - 05/12/23 1621       Intervention Plan   Intervention Prescribe, educate and counsel regarding individualized specific dietary modifications aiming towards targeted core components such as weight, hypertension, lipid management, diabetes, heart failure and other comorbidities.;Nutrition handout(s) given to patient.    Expected Outcomes Short Term Goal: Understand basic principles of dietary content, such as calories, fat, sodium, cholesterol and nutrients.;Long Term Goal: Adherence to prescribed nutrition plan.             Nutrition Assessments:  MEDIFICTS Score Key: >=70 Need to make dietary changes  40-70 Heart Healthy Diet <= 40 Therapeutic Level Cholesterol Diet  Flowsheet Row PULMONARY REHAB OTHER RESP ORIENTATION from 05/12/2023 in [facility] CARDIAC REHABILITATION  Picture Your Plate Total Score on Admission 58      Picture Your Plate Scores: <16 Unhealthy dietary pattern with much room for improvement. 41-50 Dietary pattern unlikely to meet recommendations for good health and room for improvement. 51-60 More healthful dietary pattern, with some room for improvement.  >60 Healthy dietary pattern, although there may be some specific behaviors that could be  improved.    Nutrition Goals Re-Evaluation:  Nutrition Goals Re-Evaluation     Row Name 06/03/23 1142 06/23/23 1116           Goals   Nutrition Goal Healthy Eating Short: Enjoy Christmas gooides Long: Focus on healthy variety      Comment Nicholas Andersen is doing well in rehab.  He has been working on his diet but steriods are making him eat and keeping his weight up.  He has been baking lots of pies and cookies for CHristmas.  He plans to get back to diet after holidays and enjoys cooking all his meals at home.  He aims for a good variety. Nicholas Andersen is doing well in rehab.  He enjoyed Christmas, but is now not feeling like he can eat.  He just does not feel like he is hungry.  He is back to cooking more at home again.  He is still trying for a good variety.      Expected Outcome Short: Enjoy Christmas gooides Long: Focus on healthy variety Short: Continue to cook more at home Long: continue to improve variety.               Nutrition Goals Discharge (Final Nutrition Goals Re-Evaluation):  Nutrition Goals Re-Evaluation - 06/23/23 1116       Goals   Nutrition Goal Short: Enjoy Christmas gooides Long: Focus on healthy variety    Comment Nicholas Andersen is doing well in rehab.  He enjoyed Christmas, but is now not feeling like he can eat.  He just does not feel like he is hungry.  He is back to cooking more at home again.  He is still trying for a good variety.    Expected Outcome Short: Continue to cook more at home Long: continue to improve variety.             Psychosocial: Target Goals: Acknowledge presence or absence of significant depression and/or stress, maximize coping skills, provide positive support system. Participant is able to verbalize types and ability to use techniques and skills needed for reducing stress and depression.  Initial Review & Psychosocial Screening:  Initial Psych Review &  Screening - 05/12/23 1237       Initial Review   Current issues with History of  Depression;Current Stress Concerns    Source of Stress Concerns Chronic Illness;Financial;Occupation;Unable to participate in former interests or hobbies;Unable to perform yard/household activities    Comments worried about future with myastenia and job, currently filing for disability, breathing keeping him from doing activities, uses CPAP at night usually 7 hrs at night      Family Dynamics   Good Support System? Yes   wife (nurse), 3 brothers, kids, neighbors     Barriers   Psychosocial barriers to participate in program Psychosocial barriers identified (see note);The patient should benefit from training in stress management and relaxation.      Screening Interventions   Interventions Encouraged to exercise;Provide feedback about the scores to participant;To provide support and resources with identified psychosocial needs    Expected Outcomes Short Term goal: Utilizing psychosocial counselor, staff and physician to assist with identification of specific Stressors or current issues interfering with healing process. Setting desired goal for each stressor or current issue identified.;Long Term Goal: Stressors or current issues are controlled or eliminated.;Short Term goal: Identification and review with participant of any Quality of Life or Depression concerns found by scoring the questionnaire.;Long Term goal: The participant improves quality of Life and PHQ9 Scores as seen by post scores and/or verbalization of changes             Quality of Life Scores:  Scores of 19 and below usually indicate a poorer quality of life in these areas.  A difference of  2-3 points is a clinically meaningful difference.  A difference of 2-3 points in the total score of the Quality of Life Index has been associated with significant improvement in overall quality of life, self-image, physical symptoms, and general health in studies assessing change in quality of life.   PHQ-9: Review Flowsheet        06/23/2023 05/12/2023  Depression screen PHQ 2/9  Decreased Interest 0 2  Down, Depressed, Hopeless 0 0  PHQ - 2 Score 0 2  Altered sleeping 2 2  Tired, decreased energy 1 2  Change in appetite 1 2  Feeling bad or failure about yourself  0 2  Trouble concentrating 1 1  Moving slowly or fidgety/restless 0 1  Suicidal thoughts 0 0  PHQ-9 Score 5 12  Difficult doing work/chores Somewhat difficult Somewhat difficult   Interpretation of Total Score  Total Score Depression Severity:  1-4 = Minimal depression, 5-9 = Mild depression, 10-14 = Moderate depression, 15-19 = Moderately severe depression, 20-27 = Severe depression   Psychosocial Evaluation and Intervention:  Psychosocial Evaluation - 05/12/23 1612       Psychosocial Evaluation & Interventions   Interventions Stress management education;Relaxation education;Encouraged to exercise with the program and follow exercise prescription    Comments Nicholas Andersen is coming into Pulmonary Rehab with asthma.  He had done rehab before after his heart attack.  He is worried about finances and affording gas to get here.  He is currently applying for disability but has yet to get any payment yet.  He has also been out of work so no money has been coming in recently.  He is starting with 2 days a week to help with affordability.  If he has to go back to work, that will interfere with his scheduling as well.  He has a great support system in his wife, kids, 3 brothers, and neighbors.  He states  that everyone keeps checking up on him to make sure he is good.  He sleeps well with his CPAP and is very consistent with it.  He usually sleeps about 6-7 hrs but recently only been getting 4-5 hrs from being up with prednisone again.  He has gained almost 60 lbs since starting prednisone in February and really would like to lose the weight again.  He is looking forward to improving his breathing and overall mobility, especially if he can get the weight back under better  control.  Back in his 30s when this flared up the first time, he gained 80 lb and was able to get it back off.    Expected Outcomes Short: Attend rehab regularly to build up strength and breathing Long: Work on weight loss with exercise    Continue Psychosocial Services  Follow up required by staff             Psychosocial Re-Evaluation:  Psychosocial Re-Evaluation     Row Name 06/03/23 1140 06/23/23 1113           Psychosocial Re-Evaluation   Current issues with Current Sleep Concerns;Current Stress Concerns Current Sleep Concerns;Current Stress Concerns      Comments Nicholas Andersen is doing well in rehab.  He rates his stress around medium due to his finances.  Since applying for disability he is not able to get paid as much and it is hard on him.  He also continues to have issues with sleep but did sleep well last night.  He is enjoying Christmas baking and discovering his mom's recipes that had been forgotten.  He is baking a lot currently and shares that love wiht his brothers. Nicholas Andersen is doing well in rehab.  He is feeling better overall.  He is still struggling with sleep and got 6 hrs with his CPAP last night, but it was not continuos.  He did not feel great yesterday.  His PHQ has greatly improved from a 12 down to a 5.  A lot comes from not sleeping well and not feeling like eating.  He is still dealing with work disability issues and not getting paid.  Mena Pauls is not going to pay for his cancer treatments and he is working on getting his coverage back.      Expected Outcomes Short: Enjoy Christmas baking Long; Conitnue to work on sleep Short: Continue to sort through insurance issues Long: Conitnue to work on sleep      Interventions Encouraged to attend Pulmonary Rehabilitation for the exercise Encouraged to attend Pulmonary Rehabilitation for the exercise;Stress management education      Continue Psychosocial Services  Follow up required by staff --               Psychosocial  Discharge (Final Psychosocial Re-Evaluation):  Psychosocial Re-Evaluation - 06/23/23 1113       Psychosocial Re-Evaluation   Current issues with Current Sleep Concerns;Current Stress Concerns    Comments Nicholas Andersen is doing well in rehab.  He is feeling better overall.  He is still struggling with sleep and got 6 hrs with his CPAP last night, but it was not continuos.  He did not feel great yesterday.  His PHQ has greatly improved from a 12 down to a 5.  A lot comes from not sleeping well and not feeling like eating.  He is still dealing with work disability issues and not getting paid.  Mena Pauls is not going to pay for his cancer treatments and he is working  on getting his coverage back.    Expected Outcomes Short: Continue to sort through insurance issues Long: Conitnue to work on sleep    Interventions Encouraged to attend Pulmonary Rehabilitation for the exercise;Stress management education              Education: Education Goals: Education classes will be provided on a weekly basis, covering required topics. Participant will state understanding/return demonstration of topics presented.  Learning Barriers/Preferences:  Learning Barriers/Preferences - 05/12/23 1233       Learning Barriers/Preferences   Learning Barriers Sight   glasses for reading   Learning Preferences Skilled Demonstration             Education Topics: How Lungs Work and Diseases: - Discuss the anatomy of the lungs and diseases that can affect the lungs, such as COPD.   Exercise: -Discuss the importance of exercise, FITT principles of exercise, normal and abnormal responses to exercise, and how to exercise safely.   Environmental Irritants: -Discuss types of environmental irritants and how to limit exposure to environmental irritants.   Meds/Inhalers and oxygen: - Discuss respiratory medications, definition of an inhaler and oxygen, and the proper way to use an inhaler and oxygen.   Energy Saving  Techniques: - Discuss methods to conserve energy and decrease shortness of breath when performing activities of daily living.    Bronchial Hygiene / Breathing Techniques: - Discuss breathing mechanics, pursed-lip breathing technique,  proper posture, effective ways to clear airways, and other functional breathing techniques   Cleaning Equipment: - Provides group verbal and written instruction about the health risks of elevated stress, cause of high stress, and healthy ways to reduce stress.   Nutrition I: Fats: - Discuss the types of cholesterol, what cholesterol does to the body, and how cholesterol levels can be controlled. Flowsheet Row PULMONARY REHAB OTHER RESPIRATORY from 07/17/2023 in [facility]  Date 05/29/23  Educator hb.  Instruction Review Code 1- Verbalizes Understanding       Nutrition II: Labels: -Discuss the different components of food labels and how to read food labels. Flowsheet Row PULMONARY REHAB OTHER RESPIRATORY from 07/17/2023 in [facility]  Date 05/29/23  Educator HB  Instruction Review Code 1- Verbalizes Understanding       Respiratory Infections: - Discuss the signs and symptoms of respiratory infections, ways to prevent respiratory infections, and the importance of seeking medical treatment when having a respiratory infection. Flowsheet Row PULMONARY REHAB OTHER RESPIRATORY from 07/17/2023 in [place] [facility]  Date 07/17/23  Educator HB  Instruction Review Code 1- Verbalizes Understanding       Stress I: Signs and Symptoms: - Discuss the causes of stress, how stress may lead to anxiety and depression, and ways to limit stress. Flowsheet Row PULMONARY REHAB OTHER RESPIRATORY from 07/17/2023 in [place] [facility]  Date 06/19/23  Educator hb  Instruction Review Code 1- Verbalizes Understanding       Stress II: Relaxation: -Discuss relaxation techniques to limit  stress.   Oxygen for Home/Travel: - Discuss how to prepare for travel when on oxygen and proper ways to transport and store oxygen to ensure safety.   Knowledge Questionnaire Score:  Knowledge Questionnaire Score - 05/12/23 1621       Knowledge Questionnaire Score   Pre Score 16/18             Core Components/Risk Factors/Patient Goals at Admission:  Personal Goals and Risk Factors at Admission - 05/12/23 1621  Core Components/Risk Factors/Patient Goals on Admission    Weight Management Yes;Obesity;Weight Loss    Intervention Weight Management: Develop a combined nutrition and exercise program designed to reach desired caloric intake, while maintaining appropriate intake of nutrient and fiber, sodium and fats, and appropriate energy expenditure required for the weight goal.;Weight Management: Provide education and appropriate resources to help participant work on and attain dietary goals.;Weight Management/Obesity: Establish reasonable short term and long term weight goals.;Obesity: Provide education and appropriate resources to help participant work on and attain dietary goals.    Admit Weight 285 lb 9.6 oz (129.5 kg)    Goal Weight: Short Term 280 lb (127 kg)    Goal Weight: Long Term 275 lb (124.7 kg)    Expected Outcomes Short Term: Continue to assess and modify interventions until short term weight is achieved;Long Term: Adherence to nutrition and physical activity/exercise program aimed toward attainment of established weight goal;Weight Loss: Understanding of general recommendations for a balanced deficit meal plan, which promotes 1-2 lb weight loss per week and includes a negative energy balance of [phone] kcal/d;Understanding recommendations for meals to include 15-35% energy as protein, 25-35% energy from fat, 35-60% energy from carbohydrates, less than 200mg  of dietary cholesterol, 20-35 gm of total fiber daily;Understanding of distribution of calorie intake throughout  the day with the consumption of 4-5 meals/snacks    Improve shortness of breath with ADL's Yes    Intervention Provide education, individualized exercise plan and daily activity instruction to help decrease symptoms of SOB with activities of daily living.    Expected Outcomes Short Term: Improve cardiorespiratory fitness to achieve a reduction of symptoms when performing ADLs;Long Term: Be able to perform more ADLs without symptoms or delay the onset of symptoms    Increase knowledge of respiratory medications and ability to use respiratory devices properly  Yes    Intervention Provide education and demonstration as needed of appropriate use of medications, inhalers, and oxygen therapy.    Expected Outcomes Short Term: Achieves understanding of medications use. Understands that oxygen is a medication prescribed by physician. Demonstrates appropriate use of inhaler and oxygen therapy.;Long Term: Maintain appropriate use of medications, inhalers, and oxygen therapy.    Hypertension Yes    Intervention Provide education on lifestyle modifcations including regular physical activity/exercise, weight management, moderate sodium restriction and increased consumption of fresh fruit, vegetables, and low fat dairy, alcohol moderation, and smoking cessation.;Monitor prescription use compliance.    Expected Outcomes Short Term: Continued assessment and intervention until BP is < 140/59mm HG in hypertensive participants. < 130/38mm HG in hypertensive participants with diabetes, heart failure or chronic kidney disease.;Long Term: Maintenance of blood pressure at goal levels.    Lipids Yes    Intervention Provide education and support for participant on nutrition & aerobic/resistive exercise along with prescribed medications to achieve LDL 70mg , HDL >40mg .    Expected Outcomes Short Term: Participant states understanding of desired cholesterol values and is compliant with medications prescribed. Participant is  following exercise prescription and nutrition guidelines.;Long Term: Cholesterol controlled with medications as prescribed, with individualized exercise RX and with personalized nutrition plan. Value goals: LDL < 70mg , HDL > 40 mg.             Core Components/Risk Factors/Patient Goals Review:   Goals and Risk Factor Review     Row Name 06/03/23 1144 06/23/23 1118           Core Components/Risk Factors/Patient Goals Review   Personal Goals Review Weight Management/Obesity;Improve shortness of breath  with ADL's;Increase knowledge of respiratory medications and ability to use respiratory devices properly.;Hypertension Weight Management/Obesity;Improve shortness of breath with ADL's;Increase knowledge of respiratory medications and ability to use respiratory devices properly.;Hypertension      Review Nicholas Andersen is doing well in rehab.  He still wants to lose weight but steriods are making it tough.  He had lost on wengovy previously but insurance stopped paying for it and he started steroirs and up his weight went again.  He is pressures are good and his breathing has been improving.  He has good days and bad days with breathing but more good since starting rehab. Nicholas Andersen is doing well in rehab.  His breathing is getting better and he is able to do more at home now than when he started the program.  His weight is holding stready still.  He wants it to go down more.  His pressures are doing well and he is doing well with his medications as well.      Expected Outcomes Short: COnitnue to work on weight loss Long: Conitnue to monitor risk factors. Short: Conintue to work on getting weight down Long: Conitnue to work on breathing.               Core Components/Risk Factors/Patient Goals at Discharge (Final Review):   Goals and Risk Factor Review - 06/23/23 1118       Core Components/Risk Factors/Patient Goals Review   Personal Goals Review Weight Management/Obesity;Improve shortness of breath  with ADL's;Increase knowledge of respiratory medications and ability to use respiratory devices properly.;Hypertension    Review Nicholas Andersen is doing well in rehab.  His breathing is getting better and he is able to do more at home now than when he started the program.  His weight is holding stready still.  He wants it to go down more.  His pressures are doing well and he is doing well with his medications as well.    Expected Outcomes Short: Conintue to work on getting weight down Long: Conitnue to work on breathing.             ITP Comments:  ITP Comments     Row Name 05/12/23 1606 05/13/23 1504 05/14/23 1306 06/10/23 1441 07/09/23 1144   ITP Comments Patient attend orientation today.  Patient is attending Pulmonary Rehabilitation Program.  Documentation for diagnosis can be found in [facility] media tab for Cardiac Rehab.  Reviewed medical chart, RPE/RPD, gym safety, and program guidelines.  Patient was fitted to equipment they will be using during rehab.  Patient is scheduled to start exercise on Tuesday 05/13/23 at 3pm.   Initial ITP created and sent for review and signature by Dr. Erick Blinks, Medical Director for Pulmonary Rehabilitation Program. irst full day of exercise!  Patient was oriented to gym and equipment including functions, settings, policies, and procedures.  Patient's individual exercise prescription and treatment plan were reviewed.  All starting workloads were established based on the results of the 6 minute walk test done at initial orientation visit.  The plan for exercise progression was also introduced and progression will be customized based on patient's performance and goals. 30 day review completed. ITP sent to Dr.Jehanzeb Memon, Medical Director of  Pulmonary Rehab. Continue with ITP unless changes are made by physician.  New to program only completed orientation and 1 exercise session thus far. 30 day review completed. ITP sent to Dr.Jehanzeb Memon, Medical Director of  Pulmonary  Rehab. Continue with ITP unless changes are made by physician. 30 day  review completed. ITP sent to Dr.Jehanzeb Memon, Medical Director of  Pulmonary Rehab. Continue with ITP unless changes are made by physician.    Row Name 08/06/23 1405 09/03/23 1254         ITP Comments 30 day review completed. ITP sent to Dr.Jehanzeb Memon, Medical Director of  Pulmonary Rehab. Continue with ITP unless changes are made by physician. Patient has not attended since 07/17/23.  His MD notes indicate that he should continue to attend rehab.  Call attempts have been made regaurding return to rehab. 30 day review completed. ITP sent to Dr.Jehanzeb Memon, Medical Director of  Pulmonary Rehab. Continue with ITP unless changes are made by physician.               Comments: 30 day review

## 2023-09-04 ENCOUNTER — Encounter (HOSPITAL_COMMUNITY): Payer: BC Managed Care – PPO

## 2023-09-09 ENCOUNTER — Encounter (HOSPITAL_COMMUNITY): Payer: BC Managed Care – PPO

## 2023-09-16 ENCOUNTER — Encounter (HOSPITAL_COMMUNITY): Payer: Self-pay

## 2023-09-16 DIAGNOSIS — J4531 Mild persistent asthma with (acute) exacerbation: Secondary | ICD-10-CM

## 2023-09-16 NOTE — Progress Notes (Signed)
 Discharge Progress Report  Patient Details  Name: Nicholas Andersen MRN: [id number] Date of Birth: 08-16-1960 Referring Provider:   Flowsheet Row PULMONARY REHAB OTHER RESP ORIENTATION from 05/12/2023 in [facility]  Referring Provider Alghrim, Rondall Allegra MD        Number of Visits: 15  Reason for Discharge:  Early Exit:  Lack of attendance  Smoking History:  Social History   Tobacco Use  Smoking Status Former   Current packs/day: 0.00   Average packs/day: 1 pack/day for 15.0 years (15.0 ttl pk-yrs)   Types: Cigarettes   Start date: 19   Quit date: 97   Years since quitting: 35.2   Passive exposure: Never  Smokeless Tobacco Never    Diagnosis:  Mild persistent asthma with acute exacerbation  ADL UCSD:  Pulmonary Assessment Scores     Row Name 05/12/23 1622         ADL UCSD   ADL Phase Entry     SOB Score total 53     Rest 1     Walk 2     Stairs 4     Bath 4     Dress 4     Shop 2       CAT Score   CAT Score 23       mMRC Score   mMRC Score 1              Initial Exercise Prescription:  Initial Exercise Prescription - 05/12/23 1600       Date of Initial Exercise RX and Referring Provider   Date 05/12/23    Referring Provider Alghrim, Rondall Allegra MD      Oxygen   Maintain Oxygen Saturation 88% or higher      Treadmill   MPH 2.3    Grade 0.5    Minutes 15    METs 2.92      REL-XR   Level 3    Speed 50    Minutes 15    METs 2.5      Prescription Details   Frequency (times per week) 2    Duration Progress to 30 minutes of continuous aerobic without signs/symptoms of physical distress      Intensity   THRR 40-80% of Max Heartrate 95-137    Ratings of Perceived Exertion 11-13    Perceived Dyspnea 0-4      Progression   Progression Continue to progress workloads to maintain intensity without signs/symptoms of physical distress.      Resistance Training   Training Prescription Yes    Weight 3 lb    Reps 10-15              Discharge Exercise Prescription (Final Exercise Prescription Changes):  Exercise Prescription Changes - 07/03/23 1500       Response to Exercise   Blood Pressure (Admit) 102/60    Blood Pressure (Exit) 120/60    Heart Rate (Admit) 71 bpm    Heart Rate (Exercise) 109 bpm    Heart Rate (Exit) 47 bpm    Oxygen Saturation (Admit) 97 %    Oxygen Saturation (Exercise) 95 %    Oxygen Saturation (Exit) 96 %    Rating of Perceived Exertion (Exercise) 13    Perceived Dyspnea (Exercise) 3    Duration Continue with 30 min of aerobic exercise without signs/symptoms of physical distress.    Intensity THRR unchanged      Progression   Progression Continue to progress workloads to maintain intensity  without signs/symptoms of physical distress.      Resistance Training   Training Prescription Yes    Weight 4    Reps 10-15      Treadmill   MPH 1.6    Grade 0    Minutes 15    METs 2.23      REL-XR   Level 5    Speed 46    Minutes 15    METs 4.1      Home Exercise Plan   Plans to continue exercise at Home (comment)    Frequency Add 3 additional days to program exercise sessions.      Oxygen   Maintain Oxygen Saturation 88% or higher             Functional Capacity:  6 Minute Walk     Row Name 05/12/23 1608         6 Minute Walk   Phase Initial     Distance 1280 feet     Walk Time 6 minutes     # of Rest Breaks 0     MPH 2.42     METS 3.05     RPE 13     Perceived Dyspnea  2     VO2 Peak 10.67     Symptoms No     Resting HR 53 bpm     Resting BP 132/74     Resting Oxygen Saturation  97 %     Exercise Oxygen Saturation  during 6 min walk 94 %     Max Ex. HR 122 bpm     Max Ex. BP 164/84     2 Minute Post BP 152/70       Interval HR   1 Minute HR 84     2 Minute HR 96     3 Minute HR 115     4 Minute HR 122     5 Minute HR 118     6 Minute HR 113     2 Minute Post HR 53     Interval Heart Rate? Yes       Interval Oxygen   Interval  Oxygen? Yes     Baseline Oxygen Saturation % 97 %     1 Minute Oxygen Saturation % 94 %     1 Minute Liters of Oxygen 0 L  Room Air     2 Minute Oxygen Saturation % 94 %     2 Minute Liters of Oxygen 0 L     3 Minute Oxygen Saturation % 95 %     3 Minute Liters of Oxygen 0 L     4 Minute Oxygen Saturation % 95 %     4 Minute Liters of Oxygen 0 L     5 Minute Oxygen Saturation % 96 %     5 Minute Liters of Oxygen 0 L     6 Minute Oxygen Saturation % 96 %     6 Minute Liters of Oxygen 0 L     2 Minute Post Oxygen Saturation % 97 %     2 Minute Post Liters of Oxygen 0 L              Psychological, QOL, Others - Outcomes: PHQ 2/9:    06/23/2023   11:13 AM 05/12/2023   12:36 PM  Depression screen PHQ 2/9  Decreased Interest 0 2  Down, Depressed, Hopeless 0 0  PHQ - 2 Score 0 2  Altered sleeping 2 2  Tired, decreased energy 1 2  Change in appetite 1 2  Feeling bad or failure about yourself  0 2  Trouble concentrating 1 1  Moving slowly or fidgety/restless 0 1  Suicidal thoughts 0 0  PHQ-9 Score 5 12  Difficult doing work/chores Somewhat difficult Somewhat difficult       Nutrition & Weight - Outcomes:  Pre Biometrics - 05/12/23 1612       Pre Biometrics   Height 5' 10.5" (1.791 m)    Weight 285 lb 9.6 oz (129.5 kg)    Waist Circumference 48 inches    Hip Circumference 45.5 inches    Waist to Hip Ratio 1.05 %    BMI (Calculated) 40.39    Grip Strength 47.3 kg    Single Leg Stand 23.8 seconds               Education Questionnaire Score:  Knowledge Questionnaire Score - 05/12/23 1621       Knowledge Questionnaire Score   Pre Score 16/18             Goals reviewed with patient; copy given to patient.

## 2023-09-16 NOTE — Progress Notes (Signed)
 Pulmonary Individual Treatment Plan  Patient Details  Name: Nicholas Andersen MRN: [id number] Date of Birth: September 05, 1960 Referring Provider:   Flowsheet Row PULMONARY REHAB OTHER RESP ORIENTATION from 05/12/2023 in [facility]  Referring Provider Merlyn Albert MD       Initial Encounter Date:  Flowsheet Row PULMONARY REHAB OTHER RESP ORIENTATION from 05/12/2023 in [facility] [place] CARDIAC REHABILITATION  Date 05/12/23       Visit Diagnosis: Mild persistent asthma with acute exacerbation  Patient's Home Medications on Admission:   Current Outpatient Medications:    allopurinol (ZYLOPRIM) 100 MG tablet, Take 100 mg by mouth daily., Disp: , Rfl:    ALPRAZolam (XANAX) 1 MG tablet, 1 mg as needed., Disp: , Rfl:    amLODipine-benazepril (LOTREL) 10-20 MG capsule, Take 1 capsule by mouth daily., Disp: , Rfl:    aspirin EC 81 MG tablet, Take 81 mg by mouth daily. Swallow whole., Disp: , Rfl:    atorvastatin (LIPITOR) 80 MG tablet, Take 80 mg by mouth daily., Disp: , Rfl:    baclofen (LIORESAL) 10 MG tablet, Take 10 mg by mouth 2 (two) times daily as needed., Disp: , Rfl:    BREO ELLIPTA 200-25 MCG/ACT AEPB, Inhale 1 puff into the lungs daily., Disp: , Rfl:    cetirizine (ZYRTEC) 10 MG tablet, Take 10 mg by mouth as needed., Disp: , Rfl:    Cholecalciferol (VITAMIN D3) 50 MCG (2000 UT) TABS, Take 1 tablet by mouth daily., Disp: , Rfl:    Efgartigimod alfa-fcab (VYVGART IV), Inject into the vein every 30 (thirty) days., Disp: , Rfl:    famotidine (PEPCID) 20 MG tablet, Take 1 tablet by mouth 2 (two) times daily., Disp: , Rfl:    Misc. Devices MISC, by Does not apply route. CPAP, Disp: , Rfl:    Multiple Vitamin (MULTIVITAMIN WITH MINERALS) TABS tablet, Take 1 tablet by mouth daily., Disp: , Rfl:    mycophenolate (CELLCEPT) 500 MG tablet, Take 3 tabs by mouth every morning & 2 tabs every evening, Disp: , Rfl:    nitroGLYCERIN (NITROSTAT) 0.4 MG SL tablet, Place 1 tablet (0.4  mg total) under the tongue every 5 (five) minutes x 3 doses as needed for chest pain., Disp: 25 tablet, Rfl: 2   predniSONE (DELTASONE) 10 MG tablet, Take 10 mg by mouth daily., Disp: , Rfl:    pyridostigmine (MESTINON) 60 MG tablet, Take 30 mg by mouth 2 (two) times daily., Disp: , Rfl:    spironolactone (ALDACTONE) 25 MG tablet, Take 1 tablet (25 mg total) by mouth daily., Disp: 90 tablet, Rfl: 3  Past Medical History: Past Medical History:  Diagnosis Date   Asthma    Coronary artery disease    Gout    Hypercholesterolemia    Hypertension     Tobacco Use: Social History   Tobacco Use  Smoking Status Former   Current packs/day: 0.00   Average packs/day: 1 pack/day for 15.0 years (15.0 ttl pk-yrs)   Types: Cigarettes   Start date: 52   Quit date: 53   Years since quitting: 35.2   Passive exposure: Never  Smokeless Tobacco Never    Labs: Review Flowsheet       Latest Ref Rng & Units 12/19/2020 04/18/2021  Labs for ITP Cardiac and Pulmonary Rehab  Cholestrol 0 - 200 mg/dL 409  811   LDL (calc) 0 - 99 mg/dL 92  70   HDL-C >91 mg/dL 47  44   Trlycerides <478 mg/dL 295  62   Hemoglobin A1c 4.8 - 5.6 % 5.5  -    Capillary Blood Glucose: No results found for: "GLUCAP"   Pulmonary Assessment Scores:  Pulmonary Assessment Scores     Row Name 05/12/23 1622         ADL UCSD   ADL Phase Entry     SOB Score total 53     Rest 1     Walk 2     Stairs 4     Bath 4     Dress 4     Shop 2       CAT Score   CAT Score 23       mMRC Score   mMRC Score 1             UCSD: Self-administered rating of dyspnea associated with activities of daily living (ADLs) 6-point scale (0 = "not at all" to 5 = "maximal or unable to do because of breathlessness")  Scoring Scores range from 0 to 120.  Minimally important difference is 5 units  CAT: CAT can identify the health impairment of COPD patients and is better correlated with disease progression.  CAT has a scoring  range of zero to 40. The CAT score is classified into four groups of low (less than 10), medium (10 - 20), high (21-30) and very high (31-40) based on the impact level of disease on health status. A CAT score over 10 suggests significant symptoms.  A worsening CAT score could be explained by an exacerbation, poor medication adherence, poor inhaler technique, or progression of COPD or comorbid conditions.  CAT MCID is 2 points  mMRC: mMRC (Modified Medical Research Council) Dyspnea Scale is used to assess the degree of baseline functional disability in patients of respiratory disease due to dyspnea. No minimal important difference is established. A decrease in score of 1 point or greater is considered a positive change.   Pulmonary Function Assessment:  Pulmonary Function Assessment - 05/12/23 1622       Breath   Shortness of Breath Yes;Fear of Shortness of Breath;Limiting activity             Exercise Target Goals: Exercise Program Goal: Individual exercise prescription set using results from initial 6 min walk test and THRR while considering  patient's activity barriers and safety.   Exercise Prescription Goal: Initial exercise prescription builds to 30-45 minutes a day of aerobic activity, 2-3 days per week.  Home exercise guidelines will be given to patient during program as part of exercise prescription that the participant will acknowledge.  Activity Barriers & Risk Stratification:  Activity Barriers & Cardiac Risk Stratification - 05/12/23 1239       Activity Barriers & Cardiac Risk Stratification   Activity Barriers Deconditioning;Muscular Weakness;Shortness of Breath;Back Problems;Neck/Spine Problems;History of Falls;Other (comment)   buldging disc in low back   Comments Myasthenia Gravis (x2 yrs)             6 Minute Walk:  6 Minute Walk     Row Name 05/12/23 1608         6 Minute Walk   Phase Initial     Distance 1280 feet     Walk Time 6 minutes     # of  Rest Breaks 0     MPH 2.42     METS 3.05     RPE 13     Perceived Dyspnea  2     VO2 Peak 10.67  Symptoms No     Resting HR 53 bpm     Resting BP 132/74     Resting Oxygen Saturation  97 %     Exercise Oxygen Saturation  during 6 min walk 94 %     Max Ex. HR 122 bpm     Max Ex. BP 164/84     2 Minute Post BP 152/70       Interval HR   1 Minute HR 84     2 Minute HR 96     3 Minute HR 115     4 Minute HR 122     5 Minute HR 118     6 Minute HR 113     2 Minute Post HR 53     Interval Heart Rate? Yes       Interval Oxygen   Interval Oxygen? Yes     Baseline Oxygen Saturation % 97 %     1 Minute Oxygen Saturation % 94 %     1 Minute Liters of Oxygen 0 L  Room Air     2 Minute Oxygen Saturation % 94 %     2 Minute Liters of Oxygen 0 L     3 Minute Oxygen Saturation % 95 %     3 Minute Liters of Oxygen 0 L     4 Minute Oxygen Saturation % 95 %     4 Minute Liters of Oxygen 0 L     5 Minute Oxygen Saturation % 96 %     5 Minute Liters of Oxygen 0 L     6 Minute Oxygen Saturation % 96 %     6 Minute Liters of Oxygen 0 L     2 Minute Post Oxygen Saturation % 97 %     2 Minute Post Liters of Oxygen 0 L              Oxygen Initial Assessment:  Oxygen Initial Assessment - 05/12/23 1244       Home Oxygen   Home Oxygen Device None    Sleep Oxygen Prescription CPAP;None    Home Exercise Oxygen Prescription None    Home Resting Oxygen Prescription None    Compliance with Home Oxygen Use Yes      Initial 6 min Walk   Oxygen Used None      Program Oxygen Prescription   Program Oxygen Prescription None      Intervention   Short Term Goals To learn and exhibit compliance with exercise, home and travel O2 prescription;To learn and understand importance of monitoring SPO2 with pulse oximeter and demonstrate accurate use of the pulse oximeter.;To learn and understand importance of maintaining oxygen saturations>88%;To learn and demonstrate proper use of respiratory  medications;To learn and demonstrate proper pursed lip breathing techniques or other breathing techniques.     Long  Term Goals Exhibits compliance with exercise, home  and travel O2 prescription;Maintenance of O2 saturations>88%;Compliance with respiratory medication;Verbalizes importance of monitoring SPO2 with pulse oximeter and return demonstration;Exhibits proper breathing techniques, such as pursed lip breathing or other method taught during program session;Demonstrates proper use of MDI's             Oxygen Re-Evaluation:  Oxygen Re-Evaluation     Row Name 06/03/23 1145 06/23/23 1119           Program Oxygen Prescription   Program Oxygen Prescription None None        Home Oxygen   Home Oxygen  Device None None      Sleep Oxygen Prescription None;CPAP None;CPAP      Home Exercise Oxygen Prescription None None      Home Resting Oxygen Prescription None None      Compliance with Home Oxygen Use Yes Yes        Goals/Expected Outcomes   Short Term Goals To learn and exhibit compliance with exercise, home and travel O2 prescription;To learn and understand importance of monitoring SPO2 with pulse oximeter and demonstrate accurate use of the pulse oximeter.;To learn and understand importance of maintaining oxygen saturations>88%;To learn and demonstrate proper use of respiratory medications;To learn and demonstrate proper pursed lip breathing techniques or other breathing techniques.  To learn and exhibit compliance with exercise, home and travel O2 prescription;To learn and understand importance of monitoring SPO2 with pulse oximeter and demonstrate accurate use of the pulse oximeter.;To learn and understand importance of maintaining oxygen saturations>88%;To learn and demonstrate proper use of respiratory medications;To learn and demonstrate proper pursed lip breathing techniques or other breathing techniques.       Long  Term Goals Exhibits compliance with exercise, home  and travel O2  prescription;Maintenance of O2 saturations>88%;Compliance with respiratory medication;Verbalizes importance of monitoring SPO2 with pulse oximeter and return demonstration;Exhibits proper breathing techniques, such as pursed lip breathing or other method taught during program session;Demonstrates proper use of MDI's Exhibits compliance with exercise, home  and travel O2 prescription;Maintenance of O2 saturations>88%;Compliance with respiratory medication;Verbalizes importance of monitoring SPO2 with pulse oximeter and return demonstration;Exhibits proper breathing techniques, such as pursed lip breathing or other method taught during program session;Demonstrates proper use of MDI's      Comments Nicholas Andersen is doing well in rehab.  He is compliant wiht his CPAP at night, but still doesn't sleep well.  His breathing is getting better and he uses his inhalers as needed.  He is feeling better overall. Nicholas Andersen is doing well in rehab.  He is compliant with his CPAP and uses it each night.  He as been working on his breathing and it is getting better.  He continues to monitor his saturations at home and will take it with him to exercise.      Goals/Expected Outcomes Short: Continue to use PLB Long: Continued to improve breathing. Short: Continue to use PLB  long Continued compliance with CPAP               Oxygen Discharge (Final Oxygen Re-Evaluation):  Oxygen Re-Evaluation - 06/23/23 1119       Program Oxygen Prescription   Program Oxygen Prescription None      Home Oxygen   Home Oxygen Device None    Sleep Oxygen Prescription None;CPAP    Home Exercise Oxygen Prescription None    Home Resting Oxygen Prescription None    Compliance with Home Oxygen Use Yes      Goals/Expected Outcomes   Short Term Goals To learn and exhibit compliance with exercise, home and travel O2 prescription;To learn and understand importance of monitoring SPO2 with pulse oximeter and demonstrate accurate use of the pulse  oximeter.;To learn and understand importance of maintaining oxygen saturations>88%;To learn and demonstrate proper use of respiratory medications;To learn and demonstrate proper pursed lip breathing techniques or other breathing techniques.     Long  Term Goals Exhibits compliance with exercise, home  and travel O2 prescription;Maintenance of O2 saturations>88%;Compliance with respiratory medication;Verbalizes importance of monitoring SPO2 with pulse oximeter and return demonstration;Exhibits proper breathing techniques, such as pursed lip breathing or other  method taught during program session;Demonstrates proper use of MDI's    Comments Nicholas Andersen is doing well in rehab.  He is compliant with his CPAP and uses it each night.  He as been working on his breathing and it is getting better.  He continues to monitor his saturations at home and will take it with him to exercise.    Goals/Expected Outcomes Short: Continue to use PLB  long Continued compliance with CPAP             Initial Exercise Prescription:  Initial Exercise Prescription - 05/12/23 1600       Date of Initial Exercise RX and Referring Provider   Date 05/12/23    Referring Provider Alghrim, Rondall Allegra MD      Oxygen   Maintain Oxygen Saturation 88% or higher      Treadmill   MPH 2.3    Grade 0.5    Minutes 15    METs 2.92      REL-XR   Level 3    Speed 50    Minutes 15    METs 2.5      Prescription Details   Frequency (times per week) 2    Duration Progress to 30 minutes of continuous aerobic without signs/symptoms of physical distress      Intensity   THRR 40-80% of Max Heartrate 95-137    Ratings of Perceived Exertion 11-13    Perceived Dyspnea 0-4      Progression   Progression Continue to progress workloads to maintain intensity without signs/symptoms of physical distress.      Resistance Training   Training Prescription Yes    Weight 3 lb    Reps 10-15             Perform Capillary Blood Glucose  checks as needed.  Exercise Prescription Changes:   Exercise Prescription Changes     Row Name 05/12/23 1600 05/22/23 1500 06/03/23 1200 06/10/23 1500 06/23/23 1100     Response to Exercise   Blood Pressure (Admit) 132/74 120/68 122/65 126/72 --   Blood Pressure (Exercise) 164/84 162/68 -- 124/65 --   Blood Pressure (Exit) 142/72 126/68 126/64 132/78 --   Heart Rate (Admit) 53 bpm 65 bpm 70 bpm 84 bpm --   Heart Rate (Exercise) 122 bpm 110 bpm 110 bpm 95 bpm --   Heart Rate (Exit) 63 bpm 85 bpm 85 bpm 77 bpm --   Oxygen Saturation (Admit) 97 % 96 % 95 % 96 % --   Oxygen Saturation (Exercise) 94 % 95 % 96 % 94 % --   Oxygen Saturation (Exit) 96 % 97 % 97 % 94 % --   Rating of Perceived Exertion (Exercise) 13 13 13 13  --   Perceived Dyspnea (Exercise) 2 3 2 1  --   Symptoms SOB sob -- -- --   Comments walk test results -- -- -- --   Duration -- Continue with 30 min of aerobic exercise without signs/symptoms of physical distress. Continue with 30 min of aerobic exercise without signs/symptoms of physical distress. Continue with 30 min of aerobic exercise without signs/symptoms of physical distress. --   Intensity -- THRR unchanged THRR unchanged THRR unchanged --     Progression   Progression -- Continue to progress workloads to maintain intensity without signs/symptoms of physical distress. Continue to progress workloads to maintain intensity without signs/symptoms of physical distress. Continue to progress workloads to maintain intensity without signs/symptoms of physical distress. --     Resistance  Training   Training Prescription -- Yes Yes Yes --   Weight -- 4 lbs 4 lbs 4 lbs --   Reps -- 10-15 10-15 10-15 --     Treadmill   MPH -- 1.7 1.8 1.8 --   Grade -- 0 0 0 --   Minutes -- 15 15 15  --   METs -- 2.3 2.38 2.38 --     REL-XR   Level -- 4 6 5  --   Speed -- 60 58 66 --   Minutes -- 15 15 15  --   METs -- 2.9 5 4.5 --     Home Exercise Plan   Plans to continue exercise  at -- -- -- -- Home (comment)  walking with dog and brother at mall   Frequency -- -- -- -- Add 3 additional days to program exercise sessions.   Initial Home Exercises Provided -- -- -- -- 06/23/23     Oxygen   Maintain Oxygen Saturation -- 88% or higher 88% or higher 88% or higher --    Row Name 06/23/23 1300 07/03/23 1500           Response to Exercise   Blood Pressure (Admit) 136/62 102/60      Blood Pressure (Exit) 116/62 120/60      Heart Rate (Admit) 70 bpm 71 bpm      Heart Rate (Exercise) 94 bpm 109 bpm      Heart Rate (Exit) 72 bpm 47 bpm      Oxygen Saturation (Admit) 97 % 97 %      Oxygen Saturation (Exercise) 95 % 95 %      Oxygen Saturation (Exit) 96 % 96 %      Rating of Perceived Exertion (Exercise) 13 13      Perceived Dyspnea (Exercise) 2 3      Duration Continue with 30 min of aerobic exercise without signs/symptoms of physical distress. Continue with 30 min of aerobic exercise without signs/symptoms of physical distress.      Intensity THRR unchanged THRR unchanged        Progression   Progression Continue to progress workloads to maintain intensity without signs/symptoms of physical distress. Continue to progress workloads to maintain intensity without signs/symptoms of physical distress.        Resistance Training   Training Prescription Yes Yes      Weight 4 4      Reps 10-15 10-15        Treadmill   MPH 2 1.6      Grade 2 0      Minutes 15 15      METs 3.08 2.23        REL-XR   Level 5 5      Speed 48 46      Minutes 15 15      METs 1.7 4.1        Home Exercise Plan   Plans to continue exercise at Home (comment) Home (comment)      Frequency Add 3 additional days to program exercise sessions. Add 3 additional days to program exercise sessions.        Oxygen   Maintain Oxygen Saturation 88% or higher 88% or higher               Exercise Comments:   Exercise Goals and Review:   Exercise Goals     Row Name 05/12/23 1611  Exercise Goals   Increase Physical Activity Yes       Intervention Provide advice, education, support and counseling about physical activity/exercise needs.;Develop an individualized exercise prescription for aerobic and resistive training based on initial evaluation findings, risk stratification, comorbidities and participant's personal goals.       Expected Outcomes Short Term: Attend rehab on a regular basis to increase amount of physical activity.;Long Term: Add in home exercise to make exercise part of routine and to increase amount of physical activity.;Long Term: Exercising regularly at least 3-5 days a week.       Increase Strength and Stamina Yes       Intervention Provide advice, education, support and counseling about physical activity/exercise needs.;Develop an individualized exercise prescription for aerobic and resistive training based on initial evaluation findings, risk stratification, comorbidities and participant's personal goals.       Expected Outcomes Short Term: Increase workloads from initial exercise prescription for resistance, speed, and METs.;Short Term: Perform resistance training exercises routinely during rehab and add in resistance training at home;Long Term: Improve cardiorespiratory fitness, muscular endurance and strength as measured by increased METs and functional capacity ( )       Able to understand and use rate of perceived exertion (RPE) scale Yes       Intervention Provide education and explanation on how to use RPE scale       Expected Outcomes Short Term: Able to use RPE daily in rehab to express subjective intensity level;Long Term:  Able to use RPE to guide intensity level when exercising independently       Able to understand and use Dyspnea scale Yes       Intervention Provide education and explanation on how to use Dyspnea scale       Expected Outcomes Short Term: Able to use Dyspnea scale daily in rehab to express subjective sense of shortness  of breath during exertion;Long Term: Able to use Dyspnea scale to guide intensity level when exercising independently       Knowledge and understanding of Target Heart Rate Range (THRR) Yes       Intervention Provide education and explanation of THRR including how the numbers were predicted and where they are located for reference       Expected Outcomes Short Term: Able to state/look up THRR;Long Term: Able to use THRR to govern intensity when exercising independently;Short Term: Able to use daily as guideline for intensity in rehab       Able to check pulse independently Yes       Intervention Review the importance of being able to check your own pulse for safety during independent exercise;Provide education and demonstration on how to check pulse in carotid and radial arteries.       Expected Outcomes Short Term: Able to explain why pulse checking is important during independent exercise;Long Term: Able to check pulse independently and accurately       Understanding of Exercise Prescription Yes       Intervention Provide education, explanation, and written materials on patient's individual exercise prescription       Expected Outcomes Short Term: Able to explain program exercise prescription;Long Term: Able to explain home exercise prescription to exercise independently                Exercise Goals Re-Evaluation :  Exercise Goals Re-Evaluation     Row Name 06/03/23 1114 06/23/23 1107           Exercise Goal Re-Evaluation   Exercise  Goals Review Increase Physical Activity;Increase Strength and Stamina;Understanding of Exercise Prescription Increase Physical Activity;Increase Strength and Stamina;Able to understand and use rate of perceived exertion (RPE) scale;Able to understand and use Dyspnea scale;Knowledge and understanding of Target Heart Rate Range (THRR);Able to check pulse independently;Understanding of Exercise Prescription      Comments Nicholas Andersen is doing well in rehab.  He is  not doing much at home yet.  We will review home exercise guidelines with him soon.  He is starting to feel like his stamina is improving and he is gettting his strength back. Nicholas Andersen is doing well in rehab.  He is feeling pretty good overall.  He has been staying active at home with ADLs and walking his dog.  He has already noticed a difference in his ability to do his ADLs at home.  Pt plans to walk at home with dog and join brother at mall to walk for exercise.  Reviewed THR, pulse, RPE, sign and symptoms, pulse oximetery and when to call 911 or MD.  Also discussed weather considerations and indoor options.  Pt voiced understanding.      Expected Outcomes Short; review home exercise Long: Conitnue to exercise to build stamina Short: Start to walk with brother at mall Long; Continue to improve stamina.               Discharge Exercise Prescription (Final Exercise Prescription Changes):  Exercise Prescription Changes - 07/03/23 1500       Response to Exercise   Blood Pressure (Admit) 102/60    Blood Pressure (Exit) 120/60    Heart Rate (Admit) 71 bpm    Heart Rate (Exercise) 109 bpm    Heart Rate (Exit) 47 bpm    Oxygen Saturation (Admit) 97 %    Oxygen Saturation (Exercise) 95 %    Oxygen Saturation (Exit) 96 %    Rating of Perceived Exertion (Exercise) 13    Perceived Dyspnea (Exercise) 3    Duration Continue with 30 min of aerobic exercise without signs/symptoms of physical distress.    Intensity THRR unchanged      Progression   Progression Continue to progress workloads to maintain intensity without signs/symptoms of physical distress.      Resistance Training   Training Prescription Yes    Weight 4    Reps 10-15      Treadmill   MPH 1.6    Grade 0    Minutes 15    METs 2.23      REL-XR   Level 5    Speed 46    Minutes 15    METs 4.1      Home Exercise Plan   Plans to continue exercise at Home (comment)    Frequency Add 3 additional days to program exercise  sessions.      Oxygen   Maintain Oxygen Saturation 88% or higher             Nutrition:  Target Goals: Understanding of nutrition guidelines, daily intake of sodium 1500mg , cholesterol 200mg , calories 30% from fat and 7% or less from saturated fats, daily to have 5 or more servings of fruits and vegetables.  Biometrics:  Pre Biometrics - 05/12/23 1612       Pre Biometrics   Height 5' 10.5" (1.791 m)    Weight 285 lb 9.6 oz (129.5 kg)    Waist Circumference 48 inches    Hip Circumference 45.5 inches    Waist to Hip Ratio 1.05 %  BMI (Calculated) 40.39    Grip Strength 47.3 kg    Single Leg Stand 23.8 seconds              Nutrition Therapy Plan and Nutrition Goals:  Nutrition Therapy & Goals - 05/12/23 1621       Intervention Plan   Intervention Prescribe, educate and counsel regarding individualized specific dietary modifications aiming towards targeted core components such as weight, hypertension, lipid management, diabetes, heart failure and other comorbidities.;Nutrition handout(s) given to patient.    Expected Outcomes Short Term Goal: Understand basic principles of dietary content, such as calories, fat, sodium, cholesterol and nutrients.;Long Term Goal: Adherence to prescribed nutrition plan.             Nutrition Assessments:  MEDIFICTS Score Key: >=70 Need to make dietary changes  40-70 Heart Healthy Diet <= 40 Therapeutic Level Cholesterol Diet  Flowsheet Row PULMONARY REHAB OTHER RESP ORIENTATION from 05/12/2023 in [facility] CARDIAC REHABILITATION  Picture Your Plate Total Score on Admission 58      Picture Your Plate Scores: <40 Unhealthy dietary pattern with much room for improvement. 41-50 Dietary pattern unlikely to meet recommendations for good health and room for improvement. 51-60 More healthful dietary pattern, with some room for improvement.  >60 Healthy dietary pattern, although there may be some specific behaviors that could be  improved.    Nutrition Goals Re-Evaluation:  Nutrition Goals Re-Evaluation     Row Name 06/03/23 1142 06/23/23 1116           Goals   Nutrition Goal Healthy Eating Short: Enjoy Christmas gooides Long: Focus on healthy variety      Comment Nicholas Andersen is doing well in rehab.  He has been working on his diet but steriods are making him eat and keeping his weight up.  He has been baking lots of pies and cookies for CHristmas.  He plans to get back to diet after holidays and enjoys cooking all his meals at home.  He aims for a good variety. Nicholas Andersen is doing well in rehab.  He enjoyed Christmas, but is now not feeling like he can eat.  He just does not feel like he is hungry.  He is back to cooking more at home again.  He is still trying for a good variety.      Expected Outcome Short: Enjoy Christmas gooides Long: Focus on healthy variety Short: Continue to cook more at home Long: continue to improve variety.               Nutrition Goals Discharge (Final Nutrition Goals Re-Evaluation):  Nutrition Goals Re-Evaluation - 06/23/23 1116       Goals   Nutrition Goal Short: Enjoy Christmas gooides Long: Focus on healthy variety    Comment Nicholas Andersen is doing well in rehab.  He enjoyed Christmas, but is now not feeling like he can eat.  He just does not feel like he is hungry.  He is back to cooking more at home again.  He is still trying for a good variety.    Expected Outcome Short: Continue to cook more at home Long: continue to improve variety.             Psychosocial: Target Goals: Acknowledge presence or absence of significant depression and/or stress, maximize coping skills, provide positive support system. Participant is able to verbalize types and ability to use techniques and skills needed for reducing stress and depression.  Initial Review & Psychosocial Screening:  Initial Psych Review &  Screening - 05/12/23 1237       Initial Review   Current issues with History of  Depression;Current Stress Concerns    Source of Stress Concerns Chronic Illness;Financial;Occupation;Unable to participate in former interests or hobbies;Unable to perform yard/household activities    Comments worried about future with myastenia and job, currently filing for disability, breathing keeping him from doing activities, uses CPAP at night usually 7 hrs at night      Family Dynamics   Good Support System? Yes   wife (nurse), 3 brothers, kids, neighbors     Barriers   Psychosocial barriers to participate in program Psychosocial barriers identified (see note);The patient should benefit from training in stress management and relaxation.      Screening Interventions   Interventions Encouraged to exercise;Provide feedback about the scores to participant;To provide support and resources with identified psychosocial needs    Expected Outcomes Short Term goal: Utilizing psychosocial counselor, staff and physician to assist with identification of specific Stressors or current issues interfering with healing process. Setting desired goal for each stressor or current issue identified.;Long Term Goal: Stressors or current issues are controlled or eliminated.;Short Term goal: Identification and review with participant of any Quality of Life or Depression concerns found by scoring the questionnaire.;Long Term goal: The participant improves quality of Life and PHQ9 Scores as seen by post scores and/or verbalization of changes             Quality of Life Scores:  Scores of 19 and below usually indicate a poorer quality of life in these areas.  A difference of  2-3 points is a clinically meaningful difference.  A difference of 2-3 points in the total score of the Quality of Life Index has been associated with significant improvement in overall quality of life, self-image, physical symptoms, and general health in studies assessing change in quality of life.   PHQ-9: Review Flowsheet        06/23/2023 05/12/2023  Depression screen PHQ 2/9  Decreased Interest 0 2  Down, Depressed, Hopeless 0 0  PHQ - 2 Score 0 2  Altered sleeping 2 2  Tired, decreased energy 1 2  Change in appetite 1 2  Feeling bad or failure about yourself  0 2  Trouble concentrating 1 1  Moving slowly or fidgety/restless 0 1  Suicidal thoughts 0 0  PHQ-9 Score 5 12  Difficult doing work/chores Somewhat difficult Somewhat difficult   Interpretation of Total Score  Total Score Depression Severity:  1-4 = Minimal depression, 5-9 = Mild depression, 10-14 = Moderate depression, 15-19 = Moderately severe depression, 20-27 = Severe depression   Psychosocial Evaluation and Intervention:  Psychosocial Evaluation - 05/12/23 1612       Psychosocial Evaluation & Interventions   Interventions Stress management education;Relaxation education;Encouraged to exercise with the program and follow exercise prescription    Comments Nicholas Andersen is coming into Pulmonary Rehab with asthma.  He had done rehab before after his heart attack.  He is worried about finances and affording gas to get here.  He is currently applying for disability but has yet to get any payment yet.  He has also been out of work so no money has been coming in recently.  He is starting with 2 days a week to help with affordability.  If he has to go back to work, that will interfere with his scheduling as well.  He has a great support system in his wife, kids, 3 brothers, and neighbors.  He states  that everyone keeps checking up on him to make sure he is good.  He sleeps well with his CPAP and is very consistent with it.  He usually sleeps about 6-7 hrs but recently only been getting 4-5 hrs from being up with prednisone again.  He has gained almost 60 lbs since starting prednisone in February and really would like to lose the weight again.  He is looking forward to improving his breathing and overall mobility, especially if he can get the weight back under better  control.  Back in his 30s when this flared up the first time, he gained 80 lb and was able to get it back off.    Expected Outcomes Short: Attend rehab regularly to build up strength and breathing Long: Work on weight loss with exercise    Continue Psychosocial Services  Follow up required by staff             Psychosocial Re-Evaluation:  Psychosocial Re-Evaluation     Row Name 06/03/23 1140 06/23/23 1113           Psychosocial Re-Evaluation   Current issues with Current Sleep Concerns;Current Stress Concerns Current Sleep Concerns;Current Stress Concerns      Comments Nicholas Andersen is doing well in rehab.  He rates his stress around medium due to his finances.  Since applying for disability he is not able to get paid as much and it is hard on him.  He also continues to have issues with sleep but did sleep well last night.  He is enjoying Christmas baking and discovering his mom's recipes that had been forgotten.  He is baking a lot currently and shares that love wiht his brothers. Nicholas Andersen is doing well in rehab.  He is feeling better overall.  He is still struggling with sleep and got 6 hrs with his CPAP last night, but it was not continuos.  He did not feel great yesterday.  His PHQ has greatly improved from a 12 down to a 5.  A lot comes from not sleeping well and not feeling like eating.  He is still dealing with work disability issues and not getting paid.  Mena Pauls is not going to pay for his cancer treatments and he is working on getting his coverage back.      Expected Outcomes Short: Enjoy Christmas baking Long; Conitnue to work on sleep Short: Continue to sort through insurance issues Long: Conitnue to work on sleep      Interventions Encouraged to attend Pulmonary Rehabilitation for the exercise Encouraged to attend Pulmonary Rehabilitation for the exercise;Stress management education      Continue Psychosocial Services  Follow up required by staff --               Psychosocial  Discharge (Final Psychosocial Re-Evaluation):  Psychosocial Re-Evaluation - 06/23/23 1113       Psychosocial Re-Evaluation   Current issues with Current Sleep Concerns;Current Stress Concerns    Comments Nicholas Andersen is doing well in rehab.  He is feeling better overall.  He is still struggling with sleep and got 6 hrs with his CPAP last night, but it was not continuos.  He did not feel great yesterday.  His PHQ has greatly improved from a 12 down to a 5.  A lot comes from not sleeping well and not feeling like eating.  He is still dealing with work disability issues and not getting paid.  Mena Pauls is not going to pay for his cancer treatments and he is working  on getting his coverage back.    Expected Outcomes Short: Continue to sort through insurance issues Long: Conitnue to work on sleep    Interventions Encouraged to attend Pulmonary Rehabilitation for the exercise;Stress management education              Education: Education Goals: Education classes will be provided on a weekly basis, covering required topics. Participant will state understanding/return demonstration of topics presented.  Learning Barriers/Preferences:  Learning Barriers/Preferences - 05/12/23 1233       Learning Barriers/Preferences   Learning Barriers Sight   glasses for reading   Learning Preferences Skilled Demonstration             Education Topics: How Lungs Work and Diseases: - Discuss the anatomy of the lungs and diseases that can affect the lungs, such as COPD.   Exercise: -Discuss the importance of exercise, FITT principles of exercise, normal and abnormal responses to exercise, and how to exercise safely.   Environmental Irritants: -Discuss types of environmental irritants and how to limit exposure to environmental irritants.   Meds/Inhalers and oxygen: - Discuss respiratory medications, definition of an inhaler and oxygen, and the proper way to use an inhaler and oxygen.   Energy Saving  Techniques: - Discuss methods to conserve energy and decrease shortness of breath when performing activities of daily living.    Bronchial Hygiene / Breathing Techniques: - Discuss breathing mechanics, pursed-lip breathing technique,  proper posture, effective ways to clear airways, and other functional breathing techniques   Cleaning Equipment: - Provides group verbal and written instruction about the health risks of elevated stress, cause of high stress, and healthy ways to reduce stress.   Nutrition I: Fats: - Discuss the types of cholesterol, what cholesterol does to the body, and how cholesterol levels can be controlled. Flowsheet Row PULMONARY REHAB OTHER RESPIRATORY from 07/17/2023 in [facility]  Date 05/29/23  Educator hb.  Instruction Review Code 1- Verbalizes Understanding       Nutrition II: Labels: -Discuss the different components of food labels and how to read food labels. Flowsheet Row PULMONARY REHAB OTHER RESPIRATORY from 07/17/2023 in [facility]  Date 05/29/23  Educator HB  Instruction Review Code 1- Verbalizes Understanding       Respiratory Infections: - Discuss the signs and symptoms of respiratory infections, ways to prevent respiratory infections, and the importance of seeking medical treatment when having a respiratory infection. Flowsheet Row PULMONARY REHAB OTHER RESPIRATORY from 07/17/2023 in [place] [facility]  Date 07/17/23  Educator HB  Instruction Review Code 1- Verbalizes Understanding       Stress I: Signs and Symptoms: - Discuss the causes of stress, how stress may lead to anxiety and depression, and ways to limit stress. Flowsheet Row PULMONARY REHAB OTHER RESPIRATORY from 07/17/2023 in [place] [facility]  Date 06/19/23  Educator hb  Instruction Review Code 1- Verbalizes Understanding       Stress II: Relaxation: -Discuss relaxation techniques to limit  stress.   Oxygen for Home/Travel: - Discuss how to prepare for travel when on oxygen and proper ways to transport and store oxygen to ensure safety.   Knowledge Questionnaire Score:  Knowledge Questionnaire Score - 05/12/23 1621       Knowledge Questionnaire Score   Pre Score 16/18             Core Components/Risk Factors/Patient Goals at Admission:  Personal Goals and Risk Factors at Admission - 05/12/23 1621  Core Components/Risk Factors/Patient Goals on Admission    Weight Management Yes;Obesity;Weight Loss    Intervention Weight Management: Develop a combined nutrition and exercise program designed to reach desired caloric intake, while maintaining appropriate intake of nutrient and fiber, sodium and fats, and appropriate energy expenditure required for the weight goal.;Weight Management: Provide education and appropriate resources to help participant work on and attain dietary goals.;Weight Management/Obesity: Establish reasonable short term and long term weight goals.;Obesity: Provide education and appropriate resources to help participant work on and attain dietary goals.    Admit Weight 285 lb 9.6 oz (129.5 kg)    Goal Weight: Short Term 280 lb (127 kg)    Goal Weight: Long Term 275 lb (124.7 kg)    Expected Outcomes Short Term: Continue to assess and modify interventions until short term weight is achieved;Long Term: Adherence to nutrition and physical activity/exercise program aimed toward attainment of established weight goal;Weight Loss: Understanding of general recommendations for a balanced deficit meal plan, which promotes 1-2 lb weight loss per week and includes a negative energy balance of [phone] kcal/d;Understanding recommendations for meals to include 15-35% energy as protein, 25-35% energy from fat, 35-60% energy from carbohydrates, less than 200mg  of dietary cholesterol, 20-35 gm of total fiber daily;Understanding of distribution of calorie intake throughout  the day with the consumption of 4-5 meals/snacks    Improve shortness of breath with ADL's Yes    Intervention Provide education, individualized exercise plan and daily activity instruction to help decrease symptoms of SOB with activities of daily living.    Expected Outcomes Short Term: Improve cardiorespiratory fitness to achieve a reduction of symptoms when performing ADLs;Long Term: Be able to perform more ADLs without symptoms or delay the onset of symptoms    Increase knowledge of respiratory medications and ability to use respiratory devices properly  Yes    Intervention Provide education and demonstration as needed of appropriate use of medications, inhalers, and oxygen therapy.    Expected Outcomes Short Term: Achieves understanding of medications use. Understands that oxygen is a medication prescribed by physician. Demonstrates appropriate use of inhaler and oxygen therapy.;Long Term: Maintain appropriate use of medications, inhalers, and oxygen therapy.    Hypertension Yes    Intervention Provide education on lifestyle modifcations including regular physical activity/exercise, weight management, moderate sodium restriction and increased consumption of fresh fruit, vegetables, and low fat dairy, alcohol moderation, and smoking cessation.;Monitor prescription use compliance.    Expected Outcomes Short Term: Continued assessment and intervention until BP is < 140/43mm HG in hypertensive participants. < 130/60mm HG in hypertensive participants with diabetes, heart failure or chronic kidney disease.;Long Term: Maintenance of blood pressure at goal levels.    Lipids Yes    Intervention Provide education and support for participant on nutrition & aerobic/resistive exercise along with prescribed medications to achieve LDL 70mg , HDL >40mg .    Expected Outcomes Short Term: Participant states understanding of desired cholesterol values and is compliant with medications prescribed. Participant is  following exercise prescription and nutrition guidelines.;Long Term: Cholesterol controlled with medications as prescribed, with individualized exercise RX and with personalized nutrition plan. Value goals: LDL < 70mg , HDL > 40 mg.             Core Components/Risk Factors/Patient Goals Review:   Goals and Risk Factor Review     Row Name 06/03/23 1144 06/23/23 1118           Core Components/Risk Factors/Patient Goals Review   Personal Goals Review Weight Management/Obesity;Improve shortness of breath  with ADL's;Increase knowledge of respiratory medications and ability to use respiratory devices properly.;Hypertension Weight Management/Obesity;Improve shortness of breath with ADL's;Increase knowledge of respiratory medications and ability to use respiratory devices properly.;Hypertension      Review Nicholas Andersen is doing well in rehab.  He still wants to lose weight but steriods are making it tough.  He had lost on wengovy previously but insurance stopped paying for it and he started steroirs and up his weight went again.  He is pressures are good and his breathing has been improving.  He has good days and bad days with breathing but more good since starting rehab. Nicholas Andersen is doing well in rehab.  His breathing is getting better and he is able to do more at home now than when he started the program.  His weight is holding stready still.  He wants it to go down more.  His pressures are doing well and he is doing well with his medications as well.      Expected Outcomes Short: COnitnue to work on weight loss Long: Conitnue to monitor risk factors. Short: Conintue to work on getting weight down Long: Conitnue to work on breathing.               Core Components/Risk Factors/Patient Goals at Discharge (Final Review):   Goals and Risk Factor Review - 06/23/23 1118       Core Components/Risk Factors/Patient Goals Review   Personal Goals Review Weight Management/Obesity;Improve shortness of breath  with ADL's;Increase knowledge of respiratory medications and ability to use respiratory devices properly.;Hypertension    Review Nicholas Andersen is doing well in rehab.  His breathing is getting better and he is able to do more at home now than when he started the program.  His weight is holding stready still.  He wants it to go down more.  His pressures are doing well and he is doing well with his medications as well.    Expected Outcomes Short: Conintue to work on getting weight down Long: Conitnue to work on breathing.             ITP Comments:  ITP Comments     Row Name 05/12/23 1606 05/13/23 1504 05/14/23 1306 06/10/23 1441 07/09/23 1144   ITP Comments Patient attend orientation today.  Patient is attending Pulmonary Rehabilitation Program.  Documentation for diagnosis can be found in [facility] media tab for Cardiac Rehab.  Reviewed medical chart, RPE/RPD, gym safety, and program guidelines.  Patient was fitted to equipment they will be using during rehab.  Patient is scheduled to start exercise on Tuesday 05/13/23 at 3pm.   Initial ITP created and sent for review and signature by Dr. Erick Blinks, Medical Director for Pulmonary Rehabilitation Program. irst full day of exercise!  Patient was oriented to gym and equipment including functions, settings, policies, and procedures.  Patient's individual exercise prescription and treatment plan were reviewed.  All starting workloads were established based on the results of the 6 minute walk test done at initial orientation visit.  The plan for exercise progression was also introduced and progression will be customized based on patient's performance and goals. 30 day review completed. ITP sent to Dr.Jehanzeb Memon, Medical Director of  Pulmonary Rehab. Continue with ITP unless changes are made by physician.  New to program only completed orientation and 1 exercise session thus far. 30 day review completed. ITP sent to Dr.Jehanzeb Memon, Medical Director of  Pulmonary  Rehab. Continue with ITP unless changes are made by physician. 30 day  review completed. ITP sent to Dr.Jehanzeb Memon, Medical Director of  Pulmonary Rehab. Continue with ITP unless changes are made by physician.    Row Name 08/06/23 1405 09/03/23 1254 09/16/23 0816       ITP Comments 30 day review completed. ITP sent to Dr.Jehanzeb Memon, Medical Director of  Pulmonary Rehab. Continue with ITP unless changes are made by physician. Patient has not attended since 07/17/23.  His MD notes indicate that he should continue to attend rehab.  Call attempts have been made regaurding return to rehab. 30 day review completed. ITP sent to Dr.Jehanzeb Memon, Medical Director of  Pulmonary Rehab. Continue with ITP unless changes are made by physician. Pt has not attend since 07/17/23. Attempted to contact, no response. Patient discharged from program.              Comments: Discharge ITP.

## 2023-09-16 NOTE — Progress Notes (Deleted)
 Cardiac Individual Treatment Plan  Patient Details  Name: Nicholas Andersen MRN: [id number] Date of Birth: [birth year] Referring Provider:   Flowsheet Row PULMONARY REHAB OTHER RESP ORIENTATION from 05/12/2023 in [facility] CARDIAC REHABILITATION  Referring Provider Merlyn Albert MD       Initial Encounter Date:  Flowsheet Row PULMONARY REHAB OTHER RESP ORIENTATION from 05/12/2023 in [place] CARDIAC REHABILITATION  Date 05/12/23       Visit Diagnosis: Mild persistent asthma with acute exacerbation  Patient's Home Medications on Admission:  Current Outpatient Medications:    allopurinol (ZYLOPRIM) 100 MG tablet, Take 100 mg by mouth daily., Disp: , Rfl:    ALPRAZolam (XANAX) 1 MG tablet, 1 mg as needed., Disp: , Rfl:    amLODipine-benazepril (LOTREL) 10-20 MG capsule, Take 1 capsule by mouth daily., Disp: , Rfl:    aspirin EC 81 MG tablet, Take 81 mg by mouth daily. Swallow whole., Disp: , Rfl:    atorvastatin (LIPITOR) 80 MG tablet, Take 80 mg by mouth daily., Disp: , Rfl:    baclofen (LIORESAL) 10 MG tablet, Take 10 mg by mouth 2 (two) times daily as needed., Disp: , Rfl:    BREO ELLIPTA 200-25 MCG/ACT AEPB, Inhale 1 puff into the lungs daily., Disp: , Rfl:    cetirizine (ZYRTEC) 10 MG tablet, Take 10 mg by mouth as needed., Disp: , Rfl:    Cholecalciferol (VITAMIN D3) 50 MCG (2000 UT) TABS, Take 1 tablet by mouth daily., Disp: , Rfl:    Efgartigimod alfa-fcab (VYVGART IV), Inject into the vein every 30 (thirty) days., Disp: , Rfl:    famotidine (PEPCID) 20 MG tablet, Take 1 tablet by mouth 2 (two) times daily., Disp: , Rfl:    Misc. Devices MISC, by Does not apply route. CPAP, Disp: , Rfl:    Multiple Vitamin (MULTIVITAMIN WITH MINERALS) TABS tablet, Take 1 tablet by mouth daily., Disp: , Rfl:    mycophenolate (CELLCEPT) 500 MG tablet, Take 3 tabs by mouth every morning & 2 tabs every evening, Disp: , Rfl:    nitroGLYCERIN (NITROSTAT) 0.4 MG SL tablet, Place 1 tablet (0.4 mg  total) under the tongue every 5 (five) minutes x 3 doses as needed for chest pain., Disp: 25 tablet, Rfl: 2   predniSONE (DELTASONE) 10 MG tablet, Take 10 mg by mouth daily., Disp: , Rfl:    pyridostigmine (MESTINON) 60 MG tablet, Take 30 mg by mouth 2 (two) times daily., Disp: , Rfl:    spironolactone (ALDACTONE) 25 MG tablet, Take 1 tablet (25 mg total) by mouth daily., Disp: 90 tablet, Rfl: 3  Past Medical History: Past Medical History:  Diagnosis Date   Asthma    Coronary artery disease    Gout    Hypercholesterolemia    Hypertension     Tobacco Use: Social History   Tobacco Use  Smoking Status Former   Current packs/day: 0.00   Average packs/day: 1 pack/day for 15.0 years (15.0 ttl pk-yrs)   Types: Cigarettes   Start date: 7   Quit date: 106   Years since quitting: 35.2   Passive exposure: Never  Smokeless Tobacco Never    Labs: Review Flowsheet       Latest Ref Rng & Units 12/19/2020 04/18/2021  Labs for ITP Cardiac and Pulmonary Rehab  Cholestrol 0 - 200 mg/dL 191  478   LDL (calc) 0 - 99 mg/dL 92  70   HDL-C >29 mg/dL 47  44   Trlycerides <562 mg/dL 130  62   Hemoglobin A1c 4.8 - 5.6 % 5.5  -    Capillary Blood Glucose: No results found for: "GLUCAP"   Exercise Target Goals: Exercise Program Goal: Individual exercise prescription set using results from initial 6 min walk test and THRR while considering  patient's activity barriers and safety.   Exercise Prescription Goal: Starting with aerobic activity 30 plus minutes a day, 3 days per week for initial exercise prescription. Provide home exercise prescription and guidelines that participant acknowledges understanding prior to discharge.  Activity Barriers & Risk Stratification:  Activity Barriers & Cardiac Risk Stratification - 05/12/23 1239       Activity Barriers & Cardiac Risk Stratification   Activity Barriers Deconditioning;Muscular Weakness;Shortness of Breath;Back Problems;Neck/Spine  Problems;History of Falls;Other (comment)   buldging disc in low back   Comments Myasthenia Gravis (x2 yrs)             6 Minute Walk:  6 Minute Walk     Row Name 05/12/23 1608         6 Minute Walk   Phase Initial     Distance 1280 feet     Walk Time 6 minutes     # of Rest Breaks 0     MPH 2.42     METS 3.05     RPE 13     Perceived Dyspnea  2     VO2 Peak 10.67     Symptoms No     Resting HR 53 bpm     Resting BP 132/74     Resting Oxygen Saturation  97 %     Exercise Oxygen Saturation  during 6 min walk 94 %     Max Ex. HR 122 bpm     Max Ex. BP 164/84     2 Minute Post BP 152/70       Interval HR   1 Minute HR 84     2 Minute HR 96     3 Minute HR 115     4 Minute HR 122     5 Minute HR 118     6 Minute HR 113     2 Minute Post HR 53     Interval Heart Rate? Yes       Interval Oxygen   Interval Oxygen? Yes     Baseline Oxygen Saturation % 97 %     1 Minute Oxygen Saturation % 94 %     1 Minute Liters of Oxygen 0 L  Room Air     2 Minute Oxygen Saturation % 94 %     2 Minute Liters of Oxygen 0 L     3 Minute Oxygen Saturation % 95 %     3 Minute Liters of Oxygen 0 L     4 Minute Oxygen Saturation % 95 %     4 Minute Liters of Oxygen 0 L     5 Minute Oxygen Saturation % 96 %     5 Minute Liters of Oxygen 0 L     6 Minute Oxygen Saturation % 96 %     6 Minute Liters of Oxygen 0 L     2 Minute Post Oxygen Saturation % 97 %     2 Minute Post Liters of Oxygen 0 L              Oxygen Initial Assessment:  Oxygen Initial Assessment - 05/12/23 1244       Home Oxygen  Home Oxygen Device None    Sleep Oxygen Prescription CPAP;None    Home Exercise Oxygen Prescription None    Home Resting Oxygen Prescription None    Compliance with Home Oxygen Use Yes      Initial 6 min Walk   Oxygen Used None      Program Oxygen Prescription   Program Oxygen Prescription None      Intervention   Short Term Goals To learn and exhibit compliance with  exercise, home and travel O2 prescription;To learn and understand importance of monitoring SPO2 with pulse oximeter and demonstrate accurate use of the pulse oximeter.;To learn and understand importance of maintaining oxygen saturations>88%;To learn and demonstrate proper use of respiratory medications;To learn and demonstrate proper pursed lip breathing techniques or other breathing techniques.     Long  Term Goals Exhibits compliance with exercise, home  and travel O2 prescription;Maintenance of O2 saturations>88%;Compliance with respiratory medication;Verbalizes importance of monitoring SPO2 with pulse oximeter and return demonstration;Exhibits proper breathing techniques, such as pursed lip breathing or other method taught during program session;Demonstrates proper use of MDI's             Oxygen Re-Evaluation:  Oxygen Re-Evaluation     Row Name 06/03/23 1145 06/23/23 1119           Program Oxygen Prescription   Program Oxygen Prescription None None        Home Oxygen   Home Oxygen Device None None      Sleep Oxygen Prescription None;CPAP None;CPAP      Home Exercise Oxygen Prescription None None      Home Resting Oxygen Prescription None None      Compliance with Home Oxygen Use Yes Yes        Goals/Expected Outcomes   Short Term Goals To learn and exhibit compliance with exercise, home and travel O2 prescription;To learn and understand importance of monitoring SPO2 with pulse oximeter and demonstrate accurate use of the pulse oximeter.;To learn and understand importance of maintaining oxygen saturations>88%;To learn and demonstrate proper use of respiratory medications;To learn and demonstrate proper pursed lip breathing techniques or other breathing techniques.  To learn and exhibit compliance with exercise, home and travel O2 prescription;To learn and understand importance of monitoring SPO2 with pulse oximeter and demonstrate accurate use of the pulse oximeter.;To learn and  understand importance of maintaining oxygen saturations>88%;To learn and demonstrate proper use of respiratory medications;To learn and demonstrate proper pursed lip breathing techniques or other breathing techniques.       Long  Term Goals Exhibits compliance with exercise, home  and travel O2 prescription;Maintenance of O2 saturations>88%;Compliance with respiratory medication;Verbalizes importance of monitoring SPO2 with pulse oximeter and return demonstration;Exhibits proper breathing techniques, such as pursed lip breathing or other method taught during program session;Demonstrates proper use of MDI's Exhibits compliance with exercise, home  and travel O2 prescription;Maintenance of O2 saturations>88%;Compliance with respiratory medication;Verbalizes importance of monitoring SPO2 with pulse oximeter and return demonstration;Exhibits proper breathing techniques, such as pursed lip breathing or other method taught during program session;Demonstrates proper use of MDI's      Comments Nicholas Andersen is doing well in rehab.  He is compliant wiht his CPAP at night, but still doesn't sleep well.  His breathing is getting better and he uses his inhalers as needed.  He is feeling better overall. Nicholas Andersen is doing well in rehab.  He is compliant with his CPAP and uses it each night.  He as been working on his breathing and it is  getting better.  He continues to monitor his saturations at home and will take it with him to exercise.      Goals/Expected Outcomes Short: Continue to use PLB Long: Continued to improve breathing. Short: Continue to use PLB  long Continued compliance with CPAP               Oxygen Discharge (Final Oxygen Re-Evaluation):  Oxygen Re-Evaluation - 06/23/23 1119       Program Oxygen Prescription   Program Oxygen Prescription None      Home Oxygen   Home Oxygen Device None    Sleep Oxygen Prescription None;CPAP    Home Exercise Oxygen Prescription None    Home Resting Oxygen Prescription  None    Compliance with Home Oxygen Use Yes      Goals/Expected Outcomes   Short Term Goals To learn and exhibit compliance with exercise, home and travel O2 prescription;To learn and understand importance of monitoring SPO2 with pulse oximeter and demonstrate accurate use of the pulse oximeter.;To learn and understand importance of maintaining oxygen saturations>88%;To learn and demonstrate proper use of respiratory medications;To learn and demonstrate proper pursed lip breathing techniques or other breathing techniques.     Long  Term Goals Exhibits compliance with exercise, home  and travel O2 prescription;Maintenance of O2 saturations>88%;Compliance with respiratory medication;Verbalizes importance of monitoring SPO2 with pulse oximeter and return demonstration;Exhibits proper breathing techniques, such as pursed lip breathing or other method taught during program session;Demonstrates proper use of MDI's    Comments Nicholas Andersen is doing well in rehab.  He is compliant with his CPAP and uses it each night.  He as been working on his breathing and it is getting better.  He continues to monitor his saturations at home and will take it with him to exercise.    Goals/Expected Outcomes Short: Continue to use PLB  long Continued compliance with CPAP             Initial Exercise Prescription:  Initial Exercise Prescription - 05/12/23 1600       Date of Initial Exercise RX and Referring Provider   Date 05/12/23    Referring Provider Alghrim, Rondall Allegra MD      Oxygen   Maintain Oxygen Saturation 88% or higher      Treadmill   MPH 2.3    Grade 0.5    Minutes 15    METs 2.92      REL-XR   Level 3    Speed 50    Minutes 15    METs 2.5      Prescription Details   Frequency (times per week) 2    Duration Progress to 30 minutes of continuous aerobic without signs/symptoms of physical distress      Intensity   THRR 40-80% of Max Heartrate 95-137    Ratings of Perceived Exertion 11-13     Perceived Dyspnea 0-4      Progression   Progression Continue to progress workloads to maintain intensity without signs/symptoms of physical distress.      Resistance Training   Training Prescription Yes    Weight 3 lb    Reps 10-15             Perform Capillary Blood Glucose checks as needed.  Exercise Prescription Changes:   Exercise Prescription Changes     Row Name 05/12/23 1600 05/22/23 1500 06/03/23 1200 06/10/23 1500 06/23/23 1100     Response to Exercise   Blood Pressure (Admit) 132/74 120/68 122/65 126/72 --  Blood Pressure (Exercise) 164/84 162/68 -- 124/65 --   Blood Pressure (Exit) 142/72 126/68 126/64 132/78 --   Heart Rate (Admit) 53 bpm 65 bpm 70 bpm 84 bpm --   Heart Rate (Exercise) 122 bpm 110 bpm 110 bpm 95 bpm --   Heart Rate (Exit) 63 bpm 85 bpm 85 bpm 77 bpm --   Oxygen Saturation (Admit) 97 % 96 % 95 % 96 % --   Oxygen Saturation (Exercise) 94 % 95 % 96 % 94 % --   Oxygen Saturation (Exit) 96 % 97 % 97 % 94 % --   Rating of Perceived Exertion (Exercise) 13 13 13 13  --   Perceived Dyspnea (Exercise) 2 3 2 1  --   Symptoms SOB sob -- -- --   Comments walk test results -- -- -- --   Duration -- Continue with 30 min of aerobic exercise without signs/symptoms of physical distress. Continue with 30 min of aerobic exercise without signs/symptoms of physical distress. Continue with 30 min of aerobic exercise without signs/symptoms of physical distress. --   Intensity -- THRR unchanged THRR unchanged THRR unchanged --     Progression   Progression -- Continue to progress workloads to maintain intensity without signs/symptoms of physical distress. Continue to progress workloads to maintain intensity without signs/symptoms of physical distress. Continue to progress workloads to maintain intensity without signs/symptoms of physical distress. --     Paramedic Prescription -- Yes Yes Yes --   Weight -- 4 lbs 4 lbs 4 lbs --   Reps -- 10-15  10-15 10-15 --     Treadmill   MPH -- 1.7 1.8 1.8 --   Grade -- 0 0 0 --   Minutes -- 15 15 15  --   METs -- 2.3 2.38 2.38 --     REL-XR   Level -- 4 6 5  --   Speed -- 60 58 66 --   Minutes -- 15 15 15  --   METs -- 2.9 5 4.5 --     Home Exercise Plan   Plans to continue exercise at -- -- -- -- Home (comment)  walking with dog and brother at mall   Frequency -- -- -- -- Add 3 additional days to program exercise sessions.   Initial Home Exercises Provided -- -- -- -- 06/23/23     Oxygen   Maintain Oxygen Saturation -- 88% or higher 88% or higher 88% or higher --    Row Name 06/23/23 1300 07/03/23 1500           Response to Exercise   Blood Pressure (Admit) 136/62 102/60      Blood Pressure (Exit) 116/62 120/60      Heart Rate (Admit) 70 bpm 71 bpm      Heart Rate (Exercise) 94 bpm 109 bpm      Heart Rate (Exit) 72 bpm 47 bpm      Oxygen Saturation (Admit) 97 % 97 %      Oxygen Saturation (Exercise) 95 % 95 %      Oxygen Saturation (Exit) 96 % 96 %      Rating of Perceived Exertion (Exercise) 13 13      Perceived Dyspnea (Exercise) 2 3      Duration Continue with 30 min of aerobic exercise without signs/symptoms of physical distress. Continue with 30 min of aerobic exercise without signs/symptoms of physical distress.      Intensity THRR unchanged THRR unchanged  Progression   Progression Continue to progress workloads to maintain intensity without signs/symptoms of physical distress. Continue to progress workloads to maintain intensity without signs/symptoms of physical distress.        Resistance Training   Training Prescription Yes Yes      Weight 4 4      Reps 10-15 10-15        Treadmill   MPH 2 1.6      Grade 2 0      Minutes 15 15      METs 3.08 2.23        REL-XR   Level 5 5      Speed 48 46      Minutes 15 15      METs 1.7 4.1        Home Exercise Plan   Plans to continue exercise at Home (comment) Home (comment)      Frequency Add 3  additional days to program exercise sessions. Add 3 additional days to program exercise sessions.        Oxygen   Maintain Oxygen Saturation 88% or higher 88% or higher               Exercise Comments:   Exercise Goals and Review:   Exercise Goals     Row Name 05/12/23 1611             Exercise Goals   Increase Physical Activity Yes       Intervention Provide advice, education, support and counseling about physical activity/exercise needs.;Develop an individualized exercise prescription for aerobic and resistive training based on initial evaluation findings, risk stratification, comorbidities and participant's personal goals.       Expected Outcomes Short Term: Attend rehab on a regular basis to increase amount of physical activity.;Long Term: Add in home exercise to make exercise part of routine and to increase amount of physical activity.;Long Term: Exercising regularly at least 3-5 days a week.       Increase Strength and Stamina Yes       Intervention Provide advice, education, support and counseling about physical activity/exercise needs.;Develop an individualized exercise prescription for aerobic and resistive training based on initial evaluation findings, risk stratification, comorbidities and participant's personal goals.       Expected Outcomes Short Term: Increase workloads from initial exercise prescription for resistance, speed, and METs.;Short Term: Perform resistance training exercises routinely during rehab and add in resistance training at home;Long Term: Improve cardiorespiratory fitness, muscular endurance and strength as measured by increased METs and functional capacity ( )       Able to understand and use rate of perceived exertion (RPE) scale Yes       Intervention Provide education and explanation on how to use RPE scale       Expected Outcomes Short Term: Able to use RPE daily in rehab to express subjective intensity level;Long Term:  Able to use RPE to  guide intensity level when exercising independently       Able to understand and use Dyspnea scale Yes       Intervention Provide education and explanation on how to use Dyspnea scale       Expected Outcomes Short Term: Able to use Dyspnea scale daily in rehab to express subjective sense of shortness of breath during exertion;Long Term: Able to use Dyspnea scale to guide intensity level when exercising independently       Knowledge and understanding of Target Heart Rate Range (THRR) Yes  Intervention Provide education and explanation of THRR including how the numbers were predicted and where they are located for reference       Expected Outcomes Short Term: Able to state/look up THRR;Long Term: Able to use THRR to govern intensity when exercising independently;Short Term: Able to use daily as guideline for intensity in rehab       Able to check pulse independently Yes       Intervention Review the importance of being able to check your own pulse for safety during independent exercise;Provide education and demonstration on how to check pulse in carotid and radial arteries.       Expected Outcomes Short Term: Able to explain why pulse checking is important during independent exercise;Long Term: Able to check pulse independently and accurately       Understanding of Exercise Prescription Yes       Intervention Provide education, explanation, and written materials on patient's individual exercise prescription       Expected Outcomes Short Term: Able to explain program exercise prescription;Long Term: Able to explain home exercise prescription to exercise independently                Exercise Goals Re-Evaluation :  Exercise Goals Re-Evaluation     Row Name 06/03/23 1114 06/23/23 1107           Exercise Goal Re-Evaluation   Exercise Goals Review Increase Physical Activity;Increase Strength and Stamina;Understanding of Exercise Prescription Increase Physical Activity;Increase Strength and  Stamina;Able to understand and use rate of perceived exertion (RPE) scale;Able to understand and use Dyspnea scale;Knowledge and understanding of Target Heart Rate Range (THRR);Able to check pulse independently;Understanding of Exercise Prescription      Comments Nicholas Andersen is doing well in rehab.  He is not doing much at home yet.  We will review home exercise guidelines with him soon.  He is starting to feel like his stamina is improving and he is gettting his strength back. Nicholas Andersen is doing well in rehab.  He is feeling pretty good overall.  He has been staying active at home with ADLs and walking his dog.  He has already noticed a difference in his ability to do his ADLs at home.  Pt plans to walk at home with dog and join brother at mall to walk for exercise.  Reviewed THR, pulse, RPE, sign and symptoms, pulse oximetery and when to call 911 or MD.  Also discussed weather considerations and indoor options.  Pt voiced understanding.      Expected Outcomes Short; review home exercise Long: Conitnue to exercise to build stamina Short: Start to walk with brother at mall Long; Continue to improve stamina.                Discharge Exercise Prescription (Final Exercise Prescription Changes):  Exercise Prescription Changes - 07/03/23 1500       Response to Exercise   Blood Pressure (Admit) 102/60    Blood Pressure (Exit) 120/60    Heart Rate (Admit) 71 bpm    Heart Rate (Exercise) 109 bpm    Heart Rate (Exit) 47 bpm    Oxygen Saturation (Admit) 97 %    Oxygen Saturation (Exercise) 95 %    Oxygen Saturation (Exit) 96 %    Rating of Perceived Exertion (Exercise) 13    Perceived Dyspnea (Exercise) 3    Duration Continue with 30 min of aerobic exercise without signs/symptoms of physical distress.    Intensity THRR unchanged      Progression  Progression Continue to progress workloads to maintain intensity without signs/symptoms of physical distress.      Resistance Training   Training  Prescription Yes    Weight 4    Reps 10-15      Treadmill   MPH 1.6    Grade 0    Minutes 15    METs 2.23      REL-XR   Level 5    Speed 46    Minutes 15    METs 4.1      Home Exercise Plan   Plans to continue exercise at Home (comment)    Frequency Add 3 additional days to program exercise sessions.      Oxygen   Maintain Oxygen Saturation 88% or higher             Nutrition:  Target Goals: Understanding of nutrition guidelines, daily intake of sodium 1500mg , cholesterol 200mg , calories 30% from fat and 7% or less from saturated fats, daily to have 5 or more servings of fruits and vegetables.  Biometrics:  Pre Biometrics - 05/12/23 1612       Pre Biometrics   Height 5' 10.5" (1.791 m)    Weight 285 lb 9.6 oz (129.5 kg)    Waist Circumference 48 inches    Hip Circumference 45.5 inches    Waist to Hip Ratio 1.05 %    BMI (Calculated) 40.39    Grip Strength 47.3 kg    Single Leg Stand 23.8 seconds              Nutrition Therapy Plan and Nutrition Goals:  Nutrition Therapy & Goals - 05/12/23 1621       Intervention Plan   Intervention Prescribe, educate and counsel regarding individualized specific dietary modifications aiming towards targeted core components such as weight, hypertension, lipid management, diabetes, heart failure and other comorbidities.;Nutrition handout(s) given to patient.    Expected Outcomes Short Term Goal: Understand basic principles of dietary content, such as calories, fat, sodium, cholesterol and nutrients.;Long Term Goal: Adherence to prescribed nutrition plan.             Nutrition Assessments:  MEDIFICTS Score Key: >=70 Need to make dietary changes  40-70 Heart Healthy Diet <= 40 Therapeutic Level Cholesterol Diet  Flowsheet Row PULMONARY REHAB OTHER RESP ORIENTATION from 05/12/2023 in [facility] CARDIAC REHABILITATION  Picture Your Plate Total Score on Admission 58      Picture Your Plate Scores: <65  Unhealthy dietary pattern with much room for improvement. 41-50 Dietary pattern unlikely to meet recommendations for good health and room for improvement. 51-60 More healthful dietary pattern, with some room for improvement.  >60 Healthy dietary pattern, although there may be some specific behaviors that could be improved.    Nutrition Goals Re-Evaluation:  Nutrition Goals Re-Evaluation     Row Name 06/03/23 1142 06/23/23 1116           Goals   Nutrition Goal Healthy Eating Short: Enjoy Christmas gooides Long: Focus on healthy variety      Comment Nicholas Andersen is doing well in rehab.  He has been working on his diet but steriods are making him eat and keeping his weight up.  He has been baking lots of pies and cookies for CHristmas.  He plans to get back to diet after holidays and enjoys cooking all his meals at home.  He aims for a good variety. Nicholas Andersen is doing well in rehab.  He enjoyed Christmas, but is now not feeling like  he can eat.  He just does not feel like he is hungry.  He is back to cooking more at home again.  He is still trying for a good variety.      Expected Outcome Short: Enjoy Christmas gooides Long: Focus on healthy variety Short: Continue to cook more at home Long: continue to improve variety.               Nutrition Goals Discharge (Final Nutrition Goals Re-Evaluation):  Nutrition Goals Re-Evaluation - 06/23/23 1116       Goals   Nutrition Goal Short: Enjoy Christmas gooides Long: Focus on healthy variety    Comment Nicholas Andersen is doing well in rehab.  He enjoyed Christmas, but is now not feeling like he can eat.  He just does not feel like he is hungry.  He is back to cooking more at home again.  He is still trying for a good variety.    Expected Outcome Short: Continue to cook more at home Long: continue to improve variety.             Psychosocial: Target Goals: Acknowledge presence or absence of significant depression and/or stress, maximize coping skills,  provide positive support system. Participant is able to verbalize types and ability to use techniques and skills needed for reducing stress and depression.  Initial Review & Psychosocial Screening:  Initial Psych Review & Screening - 05/12/23 1237       Initial Review   Current issues with History of Depression;Current Stress Concerns    Source of Stress Concerns Chronic Illness;Financial;Occupation;Unable to participate in former interests or hobbies;Unable to perform yard/household activities    Comments worried about future with myastenia and job, currently filing for disability, breathing keeping him from doing activities, uses CPAP at night usually 7 hrs at night      Family Dynamics   Good Support System? Yes   wife (nurse), 3 brothers, kids, neighbors     Barriers   Psychosocial barriers to participate in program Psychosocial barriers identified (see note);The patient should benefit from training in stress management and relaxation.      Screening Interventions   Interventions Encouraged to exercise;Provide feedback about the scores to participant;To provide support and resources with identified psychosocial needs    Expected Outcomes Short Term goal: Utilizing psychosocial counselor, staff and physician to assist with identification of specific Stressors or current issues interfering with healing process. Setting desired goal for each stressor or current issue identified.;Long Term Goal: Stressors or current issues are controlled or eliminated.;Short Term goal: Identification and review with participant of any Quality of Life or Depression concerns found by scoring the questionnaire.;Long Term goal: The participant improves quality of Life and PHQ9 Scores as seen by post scores and/or verbalization of changes             Quality of Life Scores:  Scores of 19 and below usually indicate a poorer quality of life in these areas.  A difference of  2-3 points is a clinically meaningful  difference.  A difference of 2-3 points in the total score of the Quality of Life Index has been associated with significant improvement in overall quality of life, self-image, physical symptoms, and general health in studies assessing change in quality of life.  PHQ-9: Review Flowsheet       06/23/2023 05/12/2023  Depression screen PHQ 2/9  Decreased Interest 0 2  Down, Depressed, Hopeless 0 0  PHQ - 2 Score 0 2  Altered sleeping 2  2  Tired, decreased energy 1 2  Change in appetite 1 2  Feeling bad or failure about yourself  0 2  Trouble concentrating 1 1  Moving slowly or fidgety/restless 0 1  Suicidal thoughts 0 0  PHQ-9 Score 5 12  Difficult doing work/chores Somewhat difficult Somewhat difficult   Interpretation of Total Score  Total Score Depression Severity:  1-4 = Minimal depression, 5-9 = Mild depression, 10-14 = Moderate depression, 15-19 = Moderately severe depression, 20-27 = Severe depression   Psychosocial Evaluation and Intervention:  Psychosocial Evaluation - 05/12/23 1612       Psychosocial Evaluation & Interventions   Interventions Stress management education;Relaxation education;Encouraged to exercise with the program and follow exercise prescription    Comments Nicholas Andersen is coming into Pulmonary Rehab with asthma.  He had done rehab before after his heart attack.  He is worried about finances and affording gas to get here.  He is currently applying for disability but has yet to get any payment yet.  He has also been out of work so no money has been coming in recently.  He is starting with 2 days a week to help with affordability.  If he has to go back to work, that will interfere with his scheduling as well.  He has a great support system in his wife, kids, 3 brothers, and neighbors.  He states that everyone keeps checking up on him to make sure he is good.  He sleeps well with his CPAP and is very consistent with it.  He usually sleeps about 6-7 hrs but recently only  been getting 4-5 hrs from being up with prednisone again.  He has gained almost 60 lbs since starting prednisone in February and really would like to lose the weight again.  He is looking forward to improving his breathing and overall mobility, especially if he can get the weight back under better control.  Back in his 30s when this flared up the first time, he gained 80 lb and was able to get it back off.    Expected Outcomes Short: Attend rehab regularly to build up strength and breathing Long: Work on weight loss with exercise    Continue Psychosocial Services  Follow up required by staff             Psychosocial Re-Evaluation:  Psychosocial Re-Evaluation     Row Name 06/03/23 1140 06/23/23 1113           Psychosocial Re-Evaluation   Current issues with Current Sleep Concerns;Current Stress Concerns Current Sleep Concerns;Current Stress Concerns      Comments Nicholas Andersen is doing well in rehab.  He rates his stress around medium due to his finances.  Since applying for disability he is not able to get paid as much and it is hard on him.  He also continues to have issues with sleep but did sleep well last night.  He is enjoying Christmas baking and discovering his mom's recipes that had been forgotten.  He is baking a lot currently and shares that love wiht his brothers. Nicholas Andersen is doing well in rehab.  He is feeling better overall.  He is still struggling with sleep and got 6 hrs with his CPAP last night, but it was not continuos.  He did not feel great yesterday.  His PHQ has greatly improved from a 12 down to a 5.  A lot comes from not sleeping well and not feeling like eating.  He is still dealing with work  disability issues and not getting paid.  Nicholas Andersen is not going to pay for his cancer treatments and he is working on getting his coverage back.      Expected Outcomes Short: Enjoy Christmas baking Long; Conitnue to work on sleep Short: Continue to sort through insurance issues Long: Conitnue  to work on sleep      Interventions Encouraged to attend Pulmonary Rehabilitation for the exercise Encouraged to attend Pulmonary Rehabilitation for the exercise;Stress management education      Continue Psychosocial Services  Follow up required by staff --               Psychosocial Discharge (Final Psychosocial Re-Evaluation):  Psychosocial Re-Evaluation - 06/23/23 1113       Psychosocial Re-Evaluation   Current issues with Current Sleep Concerns;Current Stress Concerns    Comments Nicholas Andersen is doing well in rehab.  He is feeling better overall.  He is still struggling with sleep and got 6 hrs with his CPAP last night, but it was not continuos.  He did not feel great yesterday.  His PHQ has greatly improved from a 12 down to a 5.  A lot comes from not sleeping well and not feeling like eating.  He is still dealing with work disability issues and not getting paid.  Nicholas Andersen is not going to pay for his cancer treatments and he is working on getting his coverage back.    Expected Outcomes Short: Continue to sort through insurance issues Long: Conitnue to work on sleep    Interventions Encouraged to attend Pulmonary Rehabilitation for the exercise;Stress management education             Vocational Rehabilitation: Provide vocational rehab assistance to qualifying candidates.   Vocational Rehab Evaluation & Intervention:  Vocational Rehab - 05/12/23 1246       Initial Vocational Rehab Evaluation & Intervention   Assessment shows need for Vocational Rehabilitation No (P)    filing for disability            Education: Education Goals: Education classes will be provided on a weekly basis, covering required topics. Participant will state understanding/return demonstration of topics presented.  Learning Barriers/Preferences:  Learning Barriers/Preferences - 05/12/23 1233       Learning Barriers/Preferences   Learning Barriers Sight   glasses for reading   Learning Preferences  Skilled Demonstration             Education Topics: Hypertension, Hypertension Reduction -Define heart disease and high blood pressure. Discus how high blood pressure affects the body and ways to reduce high blood pressure.   Exercise and Your Heart -Discuss why it is important to exercise, the FITT principles of exercise, normal and abnormal responses to exercise, and how to exercise safely. Flowsheet Row PULMONARY REHAB OTHER RESPIRATORY from 07/17/2023 in [place] [facility]  Date 05/22/23  Educator hb  Instruction Review Code 1- Verbalizes Understanding       Angina -Discuss definition of angina, causes of angina, treatment of angina, and how to decrease risk of having angina.   Cardiac Medications -Review what the following cardiac medications are used for, how they affect the body, and side effects that may occur when taking the medications.  Medications include Aspirin, Beta blockers, calcium channel blockers, ACE Inhibitors, angiotensin receptor blockers, diuretics, digoxin, and antihyperlipidemics. Flowsheet Row PULMONARY REHAB OTHER RESPIRATORY from 07/17/2023 in [place] [facility]  Date 07/03/23  Educator HB  Instruction Review Code 1- Verbalizes Understanding  Congestive Heart Failure -Discuss the definition of CHF, how to live with CHF, the signs and symptoms of CHF, and how keep track of weight and sodium intake.   Heart Disease and Intimacy -Discus the effect sexual activity has on the heart, how changes occur during intimacy as we age, and safety during sexual activity. Flowsheet Row PULMONARY REHAB OTHER RESPIRATORY from 07/17/2023 in [place] [facility]  Date 05/15/23  Educator HB  Instruction Review Code 1- Verbalizes Understanding       Smoking Cessation / COPD -Discuss different methods to quit smoking, the health benefits of quitting smoking, and the definition of COPD.   Nutrition I: Fats -Discuss  the types of cholesterol, what cholesterol does to the heart, and how cholesterol levels can be controlled. Flowsheet Row PULMONARY REHAB OTHER RESPIRATORY from 07/17/2023 in [place] [facility]  Date 05/29/23  Educator hb.  Instruction Review Code 1- Verbalizes Understanding       Nutrition II: Labels -Discuss the different components of food labels and how to read food label Flowsheet Row PULMONARY REHAB OTHER RESPIRATORY from 07/17/2023 in [place] [facility]  Date 05/29/23  Educator HB  Instruction Review Code 1- Verbalizes Understanding       Heart Parts/Heart Disease and PAD -Discuss the anatomy of the heart, the pathway of blood circulation through the heart, and these are affected by heart disease.   Stress I: Signs and Symptoms -Discuss the causes of stress, how stress may lead to anxiety and depression, and ways to limit stress. Flowsheet Row PULMONARY REHAB OTHER RESPIRATORY from 07/17/2023 in [place] [facility]  Date 06/19/23  Educator hb  Instruction Review Code 1- Verbalizes Understanding       Stress II: Relaxation -Discuss different types of relaxation techniques to limit stress.   Warning Signs of Stroke / TIA -Discuss definition of a stroke, what the signs and symptoms are of a stroke, and how to identify when someone is having stroke.   Knowledge Questionnaire Score:  Knowledge Questionnaire Score - 05/12/23 1621       Knowledge Questionnaire Score   Pre Score 16/18             Core Components/Risk Factors/Patient Goals at Admission:  Personal Goals and Risk Factors at Admission - 05/12/23 1621       Core Components/Risk Factors/Patient Goals on Admission    Weight Management Yes;Obesity;Weight Loss    Intervention Weight Management: Develop a combined nutrition and exercise program designed to reach desired caloric intake, while maintaining appropriate intake of nutrient and fiber, sodium and fats,  and appropriate energy expenditure required for the weight goal.;Weight Management: Provide education and appropriate resources to help participant work on and attain dietary goals.;Weight Management/Obesity: Establish reasonable short term and long term weight goals.;Obesity: Provide education and appropriate resources to help participant work on and attain dietary goals.    Admit Weight 285 lb 9.6 oz (129.5 kg)    Goal Weight: Short Term 280 lb (127 kg)    Goal Weight: Long Term 275 lb (124.7 kg)    Expected Outcomes Short Term: Continue to assess and modify interventions until short term weight is achieved;Long Term: Adherence to nutrition and physical activity/exercise program aimed toward attainment of established weight goal;Weight Loss: Understanding of general recommendations for a balanced deficit meal plan, which promotes 1-2 lb weight loss per week and includes a negative energy balance of [phone] kcal/d;Understanding recommendations for meals to include 15-35% energy as protein, 25-35% energy from fat,  35-60% energy from carbohydrates, less than 200mg  of dietary cholesterol, 20-35 gm of total fiber daily;Understanding of distribution of calorie intake throughout the day with the consumption of 4-5 meals/snacks    Improve shortness of breath with ADL's Yes    Intervention Provide education, individualized exercise plan and daily activity instruction to help decrease symptoms of SOB with activities of daily living.    Expected Outcomes Short Term: Improve cardiorespiratory fitness to achieve a reduction of symptoms when performing ADLs;Long Term: Be able to perform more ADLs without symptoms or delay the onset of symptoms    Increase knowledge of respiratory medications and ability to use respiratory devices properly  Yes    Intervention Provide education and demonstration as needed of appropriate use of medications, inhalers, and oxygen therapy.    Expected Outcomes Short Term: Achieves  understanding of medications use. Understands that oxygen is a medication prescribed by physician. Demonstrates appropriate use of inhaler and oxygen therapy.;Long Term: Maintain appropriate use of medications, inhalers, and oxygen therapy.    Hypertension Yes    Intervention Provide education on lifestyle modifcations including regular physical activity/exercise, weight management, moderate sodium restriction and increased consumption of fresh fruit, vegetables, and low fat dairy, alcohol moderation, and smoking cessation.;Monitor prescription use compliance.    Expected Outcomes Short Term: Continued assessment and intervention until BP is < 140/60mm HG in hypertensive participants. < 130/20mm HG in hypertensive participants with diabetes, heart failure or chronic kidney disease.;Long Term: Maintenance of blood pressure at goal levels.    Lipids Yes    Intervention Provide education and support for participant on nutrition & aerobic/resistive exercise along with prescribed medications to achieve LDL 70mg , HDL >40mg .    Expected Outcomes Short Term: Participant states understanding of desired cholesterol values and is compliant with medications prescribed. Participant is following exercise prescription and nutrition guidelines.;Long Term: Cholesterol controlled with medications as prescribed, with individualized exercise RX and with personalized nutrition plan. Value goals: LDL < 70mg , HDL > 40 mg.             Core Components/Risk Factors/Patient Goals Review:   Goals and Risk Factor Review     Row Name 06/03/23 1144 06/23/23 1118           Core Components/Risk Factors/Patient Goals Review   Personal Goals Review Weight Management/Obesity;Improve shortness of breath with ADL's;Increase knowledge of respiratory medications and ability to use respiratory devices properly.;Hypertension Weight Management/Obesity;Improve shortness of breath with ADL's;Increase knowledge of respiratory medications  and ability to use respiratory devices properly.;Hypertension      Review Nicholas Andersen is doing well in rehab.  He still wants to lose weight but steriods are making it tough.  He had lost on wengovy previously but insurance stopped paying for it and he started steroirs and up his weight went again.  He is pressures are good and his breathing has been improving.  He has good days and bad days with breathing but more good since starting rehab. Nicholas Andersen is doing well in rehab.  His breathing is getting better and he is able to do more at home now than when he started the program.  His weight is holding stready still.  He wants it to go down more.  His pressures are doing well and he is doing well with his medications as well.      Expected Outcomes Short: COnitnue to work on weight loss Long: Conitnue to monitor risk factors. Short: Conintue to work on getting weight down Long: Conitnue to work on  breathing.               Core Components/Risk Factors/Patient Goals at Discharge (Final Review):   Goals and Risk Factor Review - 06/23/23 1118       Core Components/Risk Factors/Patient Goals Review   Personal Goals Review Weight Management/Obesity;Improve shortness of breath with ADL's;Increase knowledge of respiratory medications and ability to use respiratory devices properly.;Hypertension    Review Nicholas Andersen is doing well in rehab.  His breathing is getting better and he is able to do more at home now than when he started the program.  His weight is holding stready still.  He wants it to go down more.  His pressures are doing well and he is doing well with his medications as well.    Expected Outcomes Short: Conintue to work on getting weight down Long: Conitnue to work on breathing.             ITP Comments:  ITP Comments     Row Name 05/12/23 1606 05/13/23 1504 05/14/23 1306 06/10/23 1441 07/09/23 1144   ITP Comments Patient attend orientation today.  Patient is attending Pulmonary Rehabilitation  Program.  Documentation for diagnosis can be found in [facility] media tab for Cardiac Rehab.  Reviewed medical chart, RPE/RPD, gym safety, and program guidelines.  Patient was fitted to equipment they will be using during rehab.  Patient is scheduled to start exercise on Tuesday 05/13/23 at 3pm.   Initial ITP created and sent for review and signature by Dr. Erick Blinks, Medical Director for Pulmonary Rehabilitation Program. irst full day of exercise!  Patient was oriented to gym and equipment including functions, settings, policies, and procedures.  Patient's individual exercise prescription and treatment plan were reviewed.  All starting workloads were established based on the results of the 6 minute walk test done at initial orientation visit.  The plan for exercise progression was also introduced and progression will be customized based on patient's performance and goals. 30 day review completed. ITP sent to Dr.Jehanzeb Memon, Medical Director of  Pulmonary Rehab. Continue with ITP unless changes are made by physician.  New to program only completed orientation and 1 exercise session thus far. 30 day review completed. ITP sent to Dr.Jehanzeb Memon, Medical Director of  Pulmonary Rehab. Continue with ITP unless changes are made by physician. 30 day review completed. ITP sent to Dr.Jehanzeb Memon, Medical Director of  Pulmonary Rehab. Continue with ITP unless changes are made by physician.    Row Name 08/06/23 1405 09/03/23 1254 09/16/23 0816       ITP Comments 30 day review completed. ITP sent to Dr.Jehanzeb Memon, Medical Director of  Pulmonary Rehab. Continue with ITP unless changes are made by physician. Patient has not attended since 07/17/23.  His MD notes indicate that he should continue to attend rehab.  Call attempts have been made regaurding return to rehab. 30 day review completed. ITP sent to Dr.Jehanzeb Memon, Medical Director of  Pulmonary Rehab. Continue with ITP unless changes are made by physician.  Pt has not attend since 07/17/23. Attempted to contact, no response. Patient discharged from program.              Comments: Discharge ITP.

## 2023-09-17 ENCOUNTER — Encounter (HOSPITAL_COMMUNITY): Payer: Self-pay

## 2023-09-17 DIAGNOSIS — J4531 Mild persistent asthma with (acute) exacerbation: Secondary | ICD-10-CM

## 2023-09-17 NOTE — Progress Notes (Signed)
 Pulmonary Individual Treatment Plan  Patient Details  Name: Nicholas Andersen MRN: [id number] Date of Birth: 01/25/1961 Referring Provider:   Flowsheet Row PULMONARY REHAB OTHER RESP ORIENTATION from 05/12/2023 in [facility] CARDIAC REHABILITATION  Referring Provider Merlyn Albert MD       Initial Encounter Date:  Flowsheet Row PULMONARY REHAB OTHER RESP ORIENTATION from 05/12/2023 in [facility] [facility] [place] CARDIAC REHABILITATION  Date 05/12/23       Visit Diagnosis: No diagnosis found.  Patient's Home Medications on Admission:   Current Outpatient Medications:    allopurinol (ZYLOPRIM) 100 MG tablet, Take 100 mg by mouth daily., Disp: , Rfl:    ALPRAZolam (XANAX) 1 MG tablet, 1 mg as needed., Disp: , Rfl:    amLODipine-benazepril (LOTREL) 10-20 MG capsule, Take 1 capsule by mouth daily., Disp: , Rfl:    aspirin EC 81 MG tablet, Take 81 mg by mouth daily. Swallow whole., Disp: , Rfl:    atorvastatin (LIPITOR) 80 MG tablet, Take 80 mg by mouth daily., Disp: , Rfl:    baclofen (LIORESAL) 10 MG tablet, Take 10 mg by mouth 2 (two) times daily as needed., Disp: , Rfl:    BREO ELLIPTA 200-25 MCG/ACT AEPB, Inhale 1 puff into the lungs daily., Disp: , Rfl:    cetirizine (ZYRTEC) 10 MG tablet, Take 10 mg by mouth as needed., Disp: , Rfl:    Cholecalciferol (VITAMIN D3) 50 MCG (2000 UT) TABS, Take 1 tablet by mouth daily., Disp: , Rfl:    Efgartigimod alfa-fcab (VYVGART IV), Inject into the vein every 30 (thirty) days., Disp: , Rfl:    famotidine (PEPCID) 20 MG tablet, Take 1 tablet by mouth 2 (two) times daily., Disp: , Rfl:    Misc. Devices MISC, by Does not apply route. CPAP, Disp: , Rfl:    Multiple Vitamin (MULTIVITAMIN WITH MINERALS) TABS tablet, Take 1 tablet by mouth daily., Disp: , Rfl:    mycophenolate (CELLCEPT) 500 MG tablet, Take 3 tabs by mouth every morning & 2 tabs every evening, Disp: , Rfl:    nitroGLYCERIN (NITROSTAT) 0.4 MG SL tablet, Place 1 tablet (0.4 mg total) under the tongue  every 5 (five) minutes x 3 doses as needed for chest pain., Disp: 25 tablet, Rfl: 2   predniSONE (DELTASONE) 10 MG tablet, Take 10 mg by mouth daily., Disp: , Rfl:    pyridostigmine (MESTINON) 60 MG tablet, Take 30 mg by mouth 2 (two) times daily., Disp: , Rfl:    spironolactone (ALDACTONE) 25 MG tablet, Take 1 tablet (25 mg total) by mouth daily., Disp: 90 tablet, Rfl: 3  Past Medical History: Past Medical History:  Diagnosis Date   Asthma    Coronary artery disease    Gout    Hypercholesterolemia    Hypertension     Tobacco Use: Social History   Tobacco Use  Smoking Status Former   Current packs/day: 0.00   Average packs/day: 1 pack/day for 15.0 years (15.0 ttl pk-yrs)   Types: Cigarettes   Start date: 41   Quit date: 50   Years since quitting: 35.2   Passive exposure: Never  Smokeless Tobacco Never    Labs: Review Flowsheet       Latest Ref Rng & Units 12/19/2020 04/18/2021  Labs for ITP Cardiac and Pulmonary Rehab  Cholestrol 0 - 200 mg/dL 409  811   LDL (calc) 0 - 99 mg/dL 92  70   HDL-C >91 mg/dL 47  44   Trlycerides <478 mg/dL 295  62  Hemoglobin A1c 4.8 - 5.6 % 5.5  -    Capillary Blood Glucose: No results found for: "GLUCAP"   Pulmonary Assessment Scores:  UCSD: Self-administered rating of dyspnea associated with activities of daily living (ADLs) 6-point scale (0 = "not at all" to 5 = "maximal or unable to do because of breathlessness")  Scoring Scores range from 0 to 120.  Minimally important difference is 5 units  CAT: CAT can identify the health impairment of COPD patients and is better correlated with disease progression.  CAT has a scoring range of zero to 40. The CAT score is classified into four groups of low (less than 10), medium (10 - 20), high (21-30) and very high (31-40) based on the impact level of disease on health status. A CAT score over 10 suggests significant symptoms.  A worsening CAT score could be explained by an exacerbation,  poor medication adherence, poor inhaler technique, or progression of COPD or comorbid conditions.  CAT MCID is 2 points  mMRC: mMRC (Modified Medical Research Council) Dyspnea Scale is used to assess the degree of baseline functional disability in patients of respiratory disease due to dyspnea. No minimal important difference is established. A decrease in score of 1 point or greater is considered a positive change.   Pulmonary Function Assessment:   Exercise Target Goals: Exercise Program Goal: Individual exercise prescription set using results from initial 6 min walk test and THRR while considering  patient's activity barriers and safety.   Exercise Prescription Goal: Initial exercise prescription builds to 30-45 minutes a day of aerobic activity, 2-3 days per week.  Home exercise guidelines will be given to patient during program as part of exercise prescription that the participant will acknowledge.  Activity Barriers & Risk Stratification:   6 Minute Walk:   Oxygen Initial Assessment:   Oxygen Re-Evaluation:   Oxygen Discharge (Final Oxygen Re-Evaluation):   Initial Exercise Prescription:   Perform Capillary Blood Glucose checks as needed.  Exercise Prescription Changes:   Exercise Comments:   Exercise Goals and Review:   Exercise Goals Re-Evaluation :   Discharge Exercise Prescription (Final Exercise Prescription Changes):   Nutrition:  Target Goals: Understanding of nutrition guidelines, daily intake of sodium 1500mg , cholesterol 200mg , calories 30% from fat and 7% or less from saturated fats, daily to have 5 or more servings of fruits and vegetables.  Biometrics:    Nutrition Therapy Plan and Nutrition Goals:   Nutrition Assessments:  MEDIFICTS Score Key: >=70 Need to make dietary changes  40-70 Heart Healthy Diet <= 40 Therapeutic Level Cholesterol Diet  Flowsheet Row PULMONARY REHAB OTHER RESP ORIENTATION from 05/12/2023 in [facility]  CARDIAC REHABILITATION  Picture Your Plate Total Score on Admission 58      Picture Your Plate Scores: <16 Unhealthy dietary pattern with much room for improvement. 41-50 Dietary pattern unlikely to meet recommendations for good health and room for improvement. 51-60 More healthful dietary pattern, with some room for improvement.  >60 Healthy dietary pattern, although there may be some specific behaviors that could be improved.    Nutrition Goals Re-Evaluation:   Nutrition Goals Discharge (Final Nutrition Goals Re-Evaluation):   Psychosocial: Target Goals: Acknowledge presence or absence of significant depression and/or stress, maximize coping skills, provide positive support system. Participant is able to verbalize types and ability to use techniques and skills needed for reducing stress and depression.  Initial Review & Psychosocial Screening:   Quality of Life Scores:  Scores of 19 and below usually indicate a poorer  quality of life in these areas.  A difference of  2-3 points is a clinically meaningful difference.  A difference of 2-3 points in the total score of the Quality of Life Index has been associated with significant improvement in overall quality of life, self-image, physical symptoms, and general health in studies assessing change in quality of life.   PHQ-9: Review Flowsheet       06/23/2023 05/12/2023  Depression screen PHQ 2/9  Decreased Interest 0 2  Down, Depressed, Hopeless 0 0  PHQ - 2 Score 0 2  Altered sleeping 2 2  Tired, decreased energy 1 2  Change in appetite 1 2  Feeling bad or failure about yourself  0 2  Trouble concentrating 1 1  Moving slowly or fidgety/restless 0 1  Suicidal thoughts 0 0  PHQ-9 Score 5 12  Difficult doing work/chores Somewhat difficult Somewhat difficult   Interpretation of Total Score  Total Score Depression Severity:  1-4 = Minimal depression, 5-9 = Mild depression, 10-14 = Moderate depression, 15-19 = Moderately  severe depression, 20-27 = Severe depression   Psychosocial Evaluation and Intervention:   Psychosocial Re-Evaluation:   Psychosocial Discharge (Final Psychosocial Re-Evaluation):    Education: Education Goals: Education classes will be provided on a weekly basis, covering required topics. Participant will state understanding/return demonstration of topics presented.  Learning Barriers/Preferences:   Education Topics: How Lungs Work and Diseases: - Discuss the anatomy of the lungs and diseases that can affect the lungs, such as COPD.   Exercise: -Discuss the importance of exercise, FITT principles of exercise, normal and abnormal responses to exercise, and how to exercise safely.   Environmental Irritants: -Discuss types of environmental irritants and how to limit exposure to environmental irritants.   Meds/Inhalers and oxygen: - Discuss respiratory medications, definition of an inhaler and oxygen, and the proper way to use an inhaler and oxygen.   Energy Saving Techniques: - Discuss methods to conserve energy and decrease shortness of breath when performing activities of daily living.    Bronchial Hygiene / Breathing Techniques: - Discuss breathing mechanics, pursed-lip breathing technique,  proper posture, effective ways to clear airways, and other functional breathing techniques   Cleaning Equipment: - Provides group verbal and written instruction about the health risks of elevated stress, cause of high stress, and healthy ways to reduce stress.   Nutrition I: Fats: - Discuss the types of cholesterol, what cholesterol does to the body, and how cholesterol levels can be controlled. Flowsheet Row PULMONARY REHAB OTHER RESPIRATORY from 07/17/2023 in [facility] CARDIAC REHABILITATION  Date 05/29/23  Educator hb.  Instruction Review Code 1- Verbalizes Understanding       Nutrition II: Labels: -Discuss the different components of food labels and how to read food  labels. Flowsheet Row PULMONARY REHAB OTHER RESPIRATORY from 07/17/2023 in [facility] CARDIAC REHABILITATION  Date 05/29/23  Educator HB  Instruction Review Code 1- Verbalizes Understanding       Respiratory Infections: - Discuss the signs and symptoms of respiratory infections, ways to prevent respiratory infections, and the importance of seeking medical treatment when having a respiratory infection. Flowsheet Row PULMONARY REHAB OTHER RESPIRATORY from 07/17/2023 in [facility] CARDIAC REH[facility]ILITATION  Date 07/17/23  Educator HB  Instruction Review Code 1- Verbalizes Understanding       Stress I: Signs and Symptoms: - Discuss the causes of stress, how stress may lead to anxiety and depression, and ways to limit stress. Flowsheet Row PULMONARY REHAB OTHER RESPIRATORY from 07/17/2023 in [facility]  Date 06/19/23  Educator hb  Instruction Review Code 1- Verbalizes Understanding       Stress II: Relaxation: -Discuss relaxation techniques to limit stress.   Oxygen for Home/Travel: - Discuss how to prepare for travel when on oxygen and proper ways to transport and store oxygen to ensure safety.   Knowledge Questionnaire Score:   Core Components/Risk Factors/Patient Goals at Admission:   Core Components/Risk Factors/Patient Goals Review:    Core Components/Risk Factors/Patient Goals at Discharge (Final Review):    ITP Comments:  ITP Comments     Row Name 08/06/23 1405 09/03/23 1254 09/16/23 0816       ITP Comments 30 day review completed. ITP sent to Dr.Jehanzeb Memon, Medical Director of  Pulmonary Rehab. Continue with ITP unless changes are made by physician. Patient has not attended since 07/17/23.  His MD notes indicate that he should continue to attend rehab.  Call attempts have been made regaurding return to rehab. 30 day review completed. ITP sent to Dr.Jehanzeb Memon, Medical Director of  Pulmonary Rehab. Continue with ITP unless changes are made  by physician. Pt has not attend since 07/17/23. Attempted to contact, no response. Patient discharged from program.              Comments: Discharge ITP

## 2023-09-22 ENCOUNTER — Other Ambulatory Visit: Payer: Self-pay

## 2023-09-22 ENCOUNTER — Encounter (HOSPITAL_COMMUNITY): Payer: Self-pay

## 2023-09-22 ENCOUNTER — Emergency Department (HOSPITAL_COMMUNITY)
Admission: EM | Admit: 2023-09-22 | Discharge: 2023-09-22 | Discharge status: Against Medical Advice | Attending: Emergency Medicine | Admitting: Emergency Medicine

## 2023-09-22 DIAGNOSIS — L819 Disorder of pigmentation, unspecified: Secondary | ICD-10-CM | POA: Insufficient documentation

## 2023-09-22 DIAGNOSIS — Z5321 Procedure and treatment not carried out due to patient leaving prior to being seen by health care provider: Secondary | ICD-10-CM | POA: Diagnosis not present

## 2023-09-22 NOTE — ED Triage Notes (Signed)
 Pt presents to ED with c/o left foot discoloration, purplish color noted to across top of foot, pt denies pain or injury.

## 2023-09-23 ENCOUNTER — Encounter (HOSPITAL_COMMUNITY): Payer: Self-pay | Admitting: Emergency Medicine

## 2023-09-23 ENCOUNTER — Emergency Department (HOSPITAL_COMMUNITY)
Admission: EM | Admit: 2023-09-23 | Discharge: 2023-09-23 | Disposition: A | Discharge status: Home / Self Care | Attending: Emergency Medicine | Admitting: Emergency Medicine

## 2023-09-23 ENCOUNTER — Emergency Department (HOSPITAL_COMMUNITY)

## 2023-09-23 ENCOUNTER — Other Ambulatory Visit: Payer: Self-pay

## 2023-09-23 DIAGNOSIS — R233 Spontaneous ecchymoses: Secondary | ICD-10-CM | POA: Diagnosis not present

## 2023-09-23 DIAGNOSIS — M79673 Pain in unspecified foot: Secondary | ICD-10-CM | POA: Diagnosis present

## 2023-09-23 DIAGNOSIS — I2510 Atherosclerotic heart disease of native coronary artery without angina pectoris: Secondary | ICD-10-CM | POA: Insufficient documentation

## 2023-09-23 LAB — CBC WITH DIFFERENTIAL/PLATELET
Abs Immature Granulocytes: 0.03 10*3/uL (ref 0.00–0.07)
Basophils Absolute: 0 10*3/uL (ref 0.0–0.1)
Basophils Relative: 1 %
Eosinophils Absolute: 0.1 10*3/uL (ref 0.0–0.5)
Eosinophils Relative: 1 %
HCT: 45 % (ref 39.0–52.0)
Hemoglobin: 14.9 g/dL (ref 13.0–17.0)
Immature Granulocytes: 0 %
Lymphocytes Relative: 6 %
Lymphs Abs: 0.5 10*3/uL — ABNORMAL LOW (ref 0.7–4.0)
MCH: 28.9 pg (ref 26.0–34.0)
MCHC: 33.1 g/dL (ref 30.0–36.0)
MCV: 87.2 fL (ref 80.0–100.0)
Monocytes Absolute: 0.6 10*3/uL (ref 0.1–1.0)
Monocytes Relative: 7 %
Neutro Abs: 7.4 10*3/uL (ref 1.7–7.7)
Neutrophils Relative %: 85 %
Platelets: 143 10*3/uL — ABNORMAL LOW (ref 150–400)
RBC: 5.16 MIL/uL (ref 4.22–5.81)
RDW: 13.6 % (ref 11.5–15.5)
WBC: 8.7 10*3/uL (ref 4.0–10.5)
nRBC: 0 % (ref 0.0–0.2)

## 2023-09-23 LAB — COMPREHENSIVE METABOLIC PANEL WITH GFR
ALT: 25 U/L (ref 0–44)
AST: 28 U/L (ref 15–41)
Albumin: 4.1 g/dL (ref 3.5–5.0)
Alkaline Phosphatase: 54 U/L (ref 38–126)
Anion gap: 10 (ref 5–15)
BUN: 17 mg/dL (ref 8–23)
CO2: 24 mmol/L (ref 22–32)
Calcium: 9.8 mg/dL (ref 8.9–10.3)
Chloride: 103 mmol/L (ref 98–111)
Creatinine, Ser: 1.14 mg/dL (ref 0.61–1.24)
GFR, Estimated: >60 mL/min (ref >60)
Glucose, Bld: 128 mg/dL — ABNORMAL HIGH (ref 70–99)
Potassium: 3.9 mmol/L (ref 3.5–5.1)
Sodium: 137 mmol/L (ref 135–145)
Total Bilirubin: 0.6 mg/dL (ref 0.0–1.2)
Total Protein: 6.6 g/dL (ref 6.5–8.1)

## 2023-09-23 NOTE — ED Triage Notes (Signed)
 Pt presents with evaluation of discolored anterior portion of left foot, noticed on Saturday, triage last night, but left because US  was not able.

## 2023-09-23 NOTE — Discharge Instructions (Addendum)
 We suspect that your symptoms are due to bruising. Ultrasound does not reveal any evidence of clot.  We recommend that you call your PCP and follow-up in 2 weeks. Return to the ER if you start noticing any darkening/feet turning black.  Additionally return to the ER if the bruising spreads to your ankle.  Warm compresses recommended.

## 2023-09-23 NOTE — ED Provider Notes (Signed)
 [place] EMERGENCY DEPARTMENT AT [facility] Provider Note   CSN: [id number] Arrival date & time: 09/23/23  6578     History  Chief Complaint  Patient presents with   Foot Pain    Nicholas Andersen is a 63 y.o. male.  HPI    63 year old male with history of coronary artery disease, myasthenia gravis for which she is on Vyvgart infusion comes in with chief complaint of bruising.  Patient states that about 3 days ago, he noted that he had bruising to his foot when he removed his shoes in the evening.  In the morning when he put the shoes on, there was no bruising.  He denies any trauma.  He has no pain, but does have some mild soreness over the midfoot area.  He is concerned that he could have a blood clot.  No previous history of similar symptoms in the past.  He does not take any blood thinners.  Home Medications Prior to Admission medications   Medication Sig Start Date End Date Taking? Authorizing Provider  allopurinol (ZYLOPRIM) 100 MG tablet Take 100 mg by mouth daily. 11/21/20   [provider]  ALPRAZolam Prudy Feeler) 1 MG tablet 1 mg as needed. 09/24/22   [provider]  amLODipine-benazepril (LOTREL) 10-20 MG capsule Take 1 capsule by mouth daily. 02/14/21   [provider]  aspirin EC 81 MG tablet Take 81 mg by mouth daily. Swallow whole.    [provider]  atorvastatin (LIPITOR) 80 MG tablet Take 80 mg by mouth daily.    [provider]  baclofen (LIORESAL) 10 MG tablet Take 10 mg by mouth 2 (two) times daily as needed. 01/28/22   [provider]  BREO ELLIPTA 200-25 MCG/ACT AEPB Inhale 1 puff into the lungs daily. 08/07/22   [provider]  cetirizine (ZYRTEC) 10 MG tablet Take 10 mg by mouth as needed. 02/24/23   [provider]  Cholecalciferol (VITAMIN D3) 50 MCG (2000 UT) TABS Take 1 tablet by mouth daily. 02/06/23   [provider]  Efgartigimod alfa-fcab (VYVGART IV) Inject into the  vein every 30 (thirty) days.    [provider]  famotidine (PEPCID) 20 MG tablet Take 1 tablet by mouth 2 (two) times daily. 09/26/22   [provider]  Misc. Devices MISC by Does not apply route. CPAP    [provider]  Multiple Vitamin (MULTIVITAMIN WITH MINERALS) TABS tablet Take 1 tablet by mouth daily.    [provider]  mycophenolate (CELLCEPT) 500 MG tablet Take 3 tabs by mouth every morning & 2 tabs every evening 10/18/22   [provider]  nitroGLYCERIN (NITROSTAT) 0.4 MG SL tablet Place 1 tablet (0.4 mg total) under the tongue every 5 (five) minutes x 3 doses as needed for chest pain. 02/07/21   Antoine Poche, MD  predniSONE (DELTASONE) 10 MG tablet Take 10 mg by mouth daily. 02/24/23   [provider]  pyridostigmine (MESTINON) 60 MG tablet Take 30 mg by mouth 2 (two) times daily. 01/09/21   [provider]  spironolactone (ALDACTONE) 25 MG tablet Take 1 tablet (25 mg total) by mouth daily. 01/17/21   Dyann Kief, PA-C      Allergies    Penicillins    Review of Systems   Review of Systems  All other systems reviewed and are negative.   Physical Exam Updated Vital Signs BP 137/82 (BP Location: Right Arm)   Pulse (!) 55  Temp 99 F (37.2 C) (Oral)   Resp 18   Ht 5\' 10"  (1.778 m)   Wt 129.4 kg   SpO2 93%   BMI 40.93 kg/m  Physical Exam Vitals and nursing note reviewed.  Constitutional:      Appearance: He is well-developed.  HENT:     Head: Atraumatic.  Cardiovascular:     Rate and Rhythm: Normal rate.  Pulmonary:     Effort: Pulmonary effort is normal.  Musculoskeletal:     Cervical back: Neck supple.  Skin:    General: Skin is warm.     Findings: Bruising present.     Comments: Patient has ecchymosis of all the toes.  He is able to move the toes without any problems.  Just proximal to the bruising, there is mild edema.  There is soreness to palpation in that area.  No tenderness over the  toes.  Patient has 2+ dorsalis pedis and the skin is warm to touch.  Neurological:     Mental Status: He is alert and oriented to person, place, and time.     ED Results / Procedures / Treatments   Labs (all labs ordered are listed, but only abnormal results are displayed) Labs Reviewed  CBC WITH DIFFERENTIAL/PLATELET - Abnormal; Notable for the following components:      Result Value   Platelets 143 (*)    Lymphs Abs 0.5 (*)    All other components within normal limits  COMPREHENSIVE METABOLIC PANEL WITH GFR - Abnormal; Notable for the following components:   Glucose, Bld 128 (*)    All other components within normal limits    EKG None  Radiology US Venous Img Lower Unilateral Left Result Date: 09/23/2023 CLINICAL DATA:  Left anterior foot pain and bruising for 3 days EXAM: LEFT LOWER EXTREMITY VENOUS DOPPLER ULTRASOUND TECHNIQUE: Gray-scale sonography with compression, as well as color and duplex ultrasound, were performed to evaluate the deep venous system(s) from the level of the common femoral vein through the popliteal and proximal calf veins. COMPARISON:  None available FINDINGS: VENOUS Normal compressibility of the common femoral, superficial femoral, and popliteal veins, as well as the visualized calf veins. Visualized portions of profunda femoral vein and great saphenous vein unremarkable. No filling defects to suggest DVT on grayscale or color Doppler imaging. Doppler waveforms show normal direction of venous flow, normal respiratory plasticity and response to augmentation. Limited views of the contralateral common femoral vein are unremarkable. OTHER None. Limitations: none IMPRESSION: No DVT of the left lower extremity. Electronically Signed   By: Acquanetta Belling M.D.   On: 09/23/2023 11:23    Procedures Procedures    Medications Ordered in ED Medications - No data to display  ED Course/ Medical Decision Making/ A&P                                 Medical Decision  Making Amount and/or Complexity of Data Reviewed Labs: ordered.  63 year old male with history of CAD, myasthenia gravis for which he gets infusions comes in with chief complaint of rash to his lower extremity.  He is concerned for blood clots.  I reviewed patient's records including care everywhere and outside hospital records that indicate that he is on a vyvgart infusions.  Differential diagnosis considered in this patient includes cellulitis, embolic event, vasculitis, medication side effect, thrombocytopenia.  Patient's neurovascular exam is reassuring.  No concerns for infection either. Will get ultrasound  DVT, but I discussed with the patient that pretest probability for DVT is quite low.  We will get CBC and CMP to make sure there is no thrombocytopenia.  No need for INR at this time.  I will perform bedside ultrasound instead of x-ray of the foot given that patient does not have any significant pain.   2:23 PM The patient appears reasonably screened and/or stabilized for discharge and I doubt any other medical condition or other [facility] requiring further screening, evaluation, or treatment in the ED at this time prior to discharge.   Results from the ER workup discussed with the patient face to face and all questions answered to the best of my ability. The patient is safe for discharge with strict return precautions.   Final Clinical Impression(s) / ED Diagnoses Final diagnoses:  Ecchymoses, spontaneous    Rx / DC Orders ED Discharge Orders     None         Deatra Face, MD 09/23/23 1423

## 2023-10-10 ENCOUNTER — Ambulatory Visit: Admitting: Urology

## 2023-10-10 ENCOUNTER — Encounter: Payer: Self-pay | Admitting: Urology

## 2023-10-10 VITALS — BP 121/73 | HR 62

## 2023-10-10 DIAGNOSIS — N400 Enlarged prostate without lower urinary tract symptoms: Secondary | ICD-10-CM

## 2023-10-10 LAB — URINALYSIS, ROUTINE W REFLEX MICROSCOPIC
Bilirubin, UA: NEGATIVE
Glucose, UA: NEGATIVE
Ketones, UA: NEGATIVE
Leukocytes,UA: NEGATIVE
Nitrite, UA: NEGATIVE
Protein,UA: NEGATIVE
RBC, UA: NEGATIVE
Specific Gravity, UA: 1.025 (ref 1.005–1.030)
Urobilinogen, Ur: 0.2 mg/dL (ref 0.2–1.0)
pH, UA: 6 (ref 5.0–7.5)

## 2023-10-10 NOTE — Progress Notes (Signed)
 10/10/2023 10:11 AM   Jamelle Mcalpine 05/28/1961 [id number]  Referring provider: Eldridge Greig, FNP [address] [place],  [place] [address]  No chief complaint on file.   HPI: Mr Nicholas Andersen is a 63yo here for prostate cancer screening and BPH. He has not had a prostate cancer screen in several years. He has a PSA drawn last week in [place] which per patient was normal. IPSS 2 QOL 2 on no BPH therapy. Nocturia 1x. No straining to urinate. Urine stream strong. He uses tadalafil 5mg  prn with good results   PMH: Past Medical History:  Diagnosis Date   Asthma    Coronary artery disease    Gout    Hypercholesterolemia    Hypertension     Surgical History: Past Surgical History:  Procedure Laterality Date   CORONARY ATHERECTOMY N/A 12/20/2020   Procedure: CORONARY ATHERECTOMY;  Surgeon: Arty Binning, MD;  Location: [facility] INVASIVE [facility];  Service: Cardiovascular;  Laterality: N/A;   CORONARY ULTRASOUND/IVUS N/A 12/20/2020   Procedure: Intravascular Ultrasound/IVUS;  Surgeon: Arty Binning, MD;  Location: [facility] INVASIVE CV LAB;  Service: Cardiovascular;  Laterality: N/A;   LEFT HEART CATH AND CORONARY ANGIOGRAPHY N/A 12/19/2020   Procedure: LEFT HEART CATH AND CORONARY ANGIOGRAPHY;  Surgeon: Arty Binning, MD;  Location: [facility] INVAS[facility] CV [facility];  Service: Cardiovascular;  Laterality: N/A;    Home Medications:  Allergies as of 10/10/2023       Reactions   Penicillins    Mouth swells and gets short of breath        Medication List        Accurate as of Oct 10, 2023 10:11 AM. If you have any questions, ask your nurse or doctor.          STOP taking these medications    ALPRAZolam 1 MG tablet Commonly known as: XANAX   famotidine 20 MG tablet Commonly known as: PEPCID       TAKE these medications    allopurinol  100 MG tablet Commonly known as: ZYLOPRIM  Take 100 mg by mouth daily.   amLODipine -benazepril  10-20 MG capsule Commonly known as: LOTREL Take 1  capsule by mouth daily.   aspirin  EC 81 MG tablet Take 81 mg by mouth daily. Swallow whole.   atorvastatin  80 MG tablet Commonly known as: LIPITOR  Take 80 mg by mouth daily.   baclofen 10 MG tablet Commonly known as: LIORESAL Take 10 mg by mouth 2 (two) times daily as needed.   Breo Ellipta 200-25 MCG/ACT Aepb Generic drug: fluticasone furoate-vilanterol Inhale 1 puff into the lungs daily.   cetirizine 10 MG tablet Commonly known as: ZYRTEC Take 10 mg by mouth as needed.   Misc. Devices Misc by Does not apply route. CPAP   multivitamin with minerals Tabs tablet Take 1 tablet by mouth daily.   mycophenolate 500 MG tablet Commonly known as: CELLCEPT Take 3 tabs by mouth every morning & 2 tabs every evening   nitroGLYCERIN  0.4 MG SL tablet Commonly known as: NITROSTAT  Place 1 tablet (0.4 mg total) under the tongue every 5 (five) minutes x 3 doses as needed for chest pain.   predniSONE 10 MG tablet Commonly known as: DELTASONE Take 10 mg by mouth daily.   pyridostigmine 60 MG tablet Commonly known as: MESTINON Take 30 mg by mouth 2 (two) times daily.   spironolactone  25 MG tablet Commonly known as: ALDACTONE  Take 1 tablet (25 mg total) by mouth daily.   Vitamin D3 50 MCG (  2000 UT) Tabs Take 1 tablet by mouth daily.   VYVGART IV Inject into the vein every 30 (thirty) days.        Allergies:  Allergies  Allergen Reactions   Penicillins     Mouth swells and gets short of breath    Family History: Family History  Problem Relation Age of Onset   Diabetes Brother     Social History:  reports that he quit smoking about 35 years ago. His smoking use included cigarettes. He started smoking about 50 years ago. He has a 15 pack-year smoking history. He has never been exposed to tobacco smoke. He has never used smokeless tobacco. He reports current alcohol use of about 10.0 standard drinks of alcohol per week. He reports that he does not currently use  drugs.  ROS: All other review of systems were reviewed and are negative except what is noted above in HPI  Physical Exam: BP 121/73   Pulse 62   Constitutional:  Alert and oriented, No acute distress. HEENT:  AT, moist mucus membranes.  Trachea midline, no masses. Cardiovascular: No clubbing, cyanosis, or edema. Respiratory: Normal respiratory effort, no increased work of breathing. GI: Abdomen is soft, nontender, nondistended, no abdominal masses GU: No CVA tenderness. Circumcised phallus. No masses/lesions on penis, testis, scrotum. Prostate 20g smooth no nodules no induration.  Lymph: No cervical or inguinal lymphadenopathy. Skin: No rashes, bruises or suspicious lesions. Neurologic: Grossly intact, no focal deficits, moving all 4 extremities. Psychiatric: Normal mood and affect.  Laboratory Data: Lab Results  Component Value Date   WBC 8.7 09/23/2023   HGB 14.9 09/23/2023   HCT 45.0 09/23/2023   MCV 87.2 09/23/2023   PLT 143 (L) 09/23/2023    Lab Results  Component Value Date   CREATININE 1.14 09/23/2023    No results found for: "PSA"  No results found for: "TESTOSTERONE"  Lab Results  Component Value Date   HGBA1C 5.5 12/19/2020    Urinalysis No results found for: "COLORURINE", "APPEARANCEUR", "LABSPEC", "PHURINE", "GLUCOSEU", "HGBUR", "BILIRUBINUR", "KETONESUR", "PROTEINUR", "UROBILINOGEN", "NITRITE", "LEUKOCYTESUR"  No results found for: "LABMICR", "WBCUA", "RBCUA", "LABEPIT", "MUCUS", "BACTERIA"  Pertinent Imaging:  No results found for this or any previous visit.  No results found for this or any previous visit.  No results found for this or any previous visit.  No results found for this or any previous visit.  No results found for this or any previous visit.  No results found for this or any previous visit.  No results found for this or any previous visit.  No results found for this or any previous visit.   Assessment & Plan:    1.  Benign prostatic hyperplasia without lower urinary tract symptoms (Primary) -followup in 2 year with a PSA  - Urinalysis, Routine w reflex microscopic   No follow-ups on file.  Johnie Nailer, MD  [facility] 

## 2023-10-10 NOTE — Patient Instructions (Signed)
 Prostate Cancer Screening  Prostate cancer screening is testing that is done to check for the presence of prostate cancer in men. The prostate gland is a walnut-sized gland that is located below the bladder and in front of the rectum in males. The function of the prostate is to add fluid to semen during ejaculation. Prostate cancer is one of the most common types of cancer in men. Who should have prostate cancer screening? Screening recommendations vary based on age and other risk factors, as well as between the professional organizations who make the recommendations. In general, screening is recommended if: You are age 59 to 46 and have an average risk for prostate cancer. You should talk with your health care provider about your need for screening and how often screening should be done. Because most prostate cancers are slow growing and will not cause death, screening in this age group is generally reserved for men who have a 10- to 15-year life expectancy. You are younger than age 64, and you have these risk factors: Having a father, brother, or uncle who has been diagnosed with prostate cancer. The risk is higher if your family member's cancer occurred at an early age or if you have multiple family members with prostate cancer at an early age. Being a male who is [place] or is of [place] or sub-Saharan African descent. In general, screening is not recommended if: You are younger than age 71. You are between the ages of 93 and 30 and you have no risk factors. You are 74 years of age or older. At this age, the risks that screening can cause are greater than the benefits that it may provide. If you are at high risk for prostate cancer, your health care provider may recommend that you have screenings more often or that you start screening at a younger age. How is screening for prostate cancer done? The recommended prostate cancer screening test is a blood test called the prostate-specific antigen  (PSA) test. PSA is a protein that is made in the prostate. As you age, your prostate naturally produces more PSA. Abnormally high PSA levels may be caused by: Prostate cancer. An enlarged prostate that is not caused by cancer (benign prostatic hyperplasia, or BPH). This condition is very common in older men. A prostate gland infection (prostatitis) or urinary tract infection. Certain medicines such as male hormones (like testosterone) or other medicines that raise testosterone levels. A rectal exam may be done as part of prostate cancer screening to help provide information about the size of your prostate gland. When a rectal exam is performed, it should be done after the PSA level is drawn to avoid any effect on the results. Depending on the PSA results, you may need more tests, such as: A physical exam to check the size of your prostate gland, if not done as part of screening. Blood and imaging tests. A procedure to remove tissue samples from your prostate gland for testing (biopsy). This is the only way to know for certain if you have prostate cancer. What are the benefits of prostate cancer screening? Screening can help to identify cancer at an early stage, before symptoms start and when the cancer can be treated more easily. There is a small chance that screening may lower your risk of dying from prostate cancer. The chance is small because prostate cancer is a slow-growing cancer, and most men with prostate cancer die from a different cause. What are the risks of prostate cancer screening? The  main risk of prostate cancer screening is diagnosing and treating prostate cancer that would never have caused any symptoms or problems. This is called overdiagnosisand overtreatment. PSA screening cannot tell you if your PSA is high due to cancer or a different cause. A prostate biopsy is the only procedure to diagnose prostate cancer. Even the results of a biopsy may not tell you if your cancer needs to  be treated. Slow-growing prostate cancer may not need any treatment other than monitoring, so diagnosing and treating it may cause unnecessary stress or other side effects. Questions to ask your health care provider When should I start prostate cancer screening? What is my risk for prostate cancer? How often do I need screening? What type of screening tests do I need? How do I get my test results? What do my results mean? Do I need treatment? Where to find more information The American Cancer Society: www.cancer.org American Urological Association: www.auanet.org Contact a health care provider if: You have difficulty urinating. You have pain when you urinate or ejaculate. You have blood in your urine or semen. You have pain in your back or in the area of your prostate. Summary Prostate cancer is a common type of cancer in men. The prostate gland is located below the bladder and in front of the rectum. This gland adds fluid to semen during ejaculation. Prostate cancer screening may identify cancer at an early stage, when the cancer can be treated more easily and is less likely to have spread to other areas of the body. The prostate-specific antigen (PSA) test is the recommended screening test for prostate cancer, but it has associated risks. Discuss the risks and benefits of prostate cancer screening with your health care provider. If you are age 73 or older, the risks that screening can cause are greater than the benefits that it may provide. This information is not intended to replace advice given to you by your health care provider. Make sure you discuss any questions you have with your health care provider. Document Revised: 11/20/2020 Document Reviewed: 11/20/2020 Elsevier Patient Education  2024 [employer].

## 2023-11-27 ENCOUNTER — Ambulatory Visit: Payer: BC Managed Care – PPO | Attending: Cardiology | Admitting: Cardiology

## 2023-11-27 NOTE — Progress Notes (Deleted)
 Clinical Summary [name] is a 63 y.o.male seen today for follow up of the following medical problems.        1.CAD/NSTEMI - admitted 12/2020 with NSTEMI - 12/2020 echo LVEF 50-55%, indet diastolic, normal rV - 12/2020 cath: LAD prox 85% and mid 50%, occldued mid LCX, prox RCA 60% - had orbtital atherectomy and DES to LAD - no beta blocker due to bradycardia - 04/2021 echo LVEF 60-65%, no WMAs  03/2023 echo: LVEF 60-65%, AV sclerosis no stenosis.     -changed brillinta to plavix  due to fatigue, though was unclear was the culprit at the time, though symptoms much improved with change.    -no chest pains, no SOB/DOE - compliant with meds      2.OSA -on cpap,  followed Dr. Asa Andersen neurology in [place]     3. 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 Hyperlipdiemia - 04/2021 TC 126 TG 62 HDL 44 LDL 70 - he is on atorvastatin  80mg  daily - reprts had recent labs with pcp   04/2022 TC 107 TG 63 HDL 42 LDL 51     6. Aortic sclerosis   7. 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     SH: works in extreme temperatures at work, does heavy lifting. Works for [employer] Past Medical History:  Diagnosis Date   Asthma    Coronary artery disease    Gout    Hypercholesterolemia    Hypertension      Allergies  Allergen Reactions   Penicillins     Mouth swells and gets short of breath     Current Outpatient Medications  Medication Sig Dispense Refill   allopurinol  (ZYLOPRIM ) 100 MG tablet Take 100 mg by mouth daily.     amLODipine -benazepril  (LOTREL) 10-20 MG capsule Take 1 capsule by mouth daily.     aspirin  EC 81 MG tablet Take 81 mg by mouth daily. Swallow whole.     atorvastatin  (LIPITOR ) 80 MG tablet Take 80 mg by mouth daily.      baclofen (LIORESAL) 10 MG tablet Take 10 mg by mouth 2 (two) times daily as needed.     BREO ELLIPTA 200-25 MCG/ACT AEPB Inhale 1 puff into the lungs daily.     cetirizine (ZYRTEC) 10 MG tablet Take 10 mg by mouth as needed.     Cholecalciferol (VITAMIN D3) 50 MCG (2000 UT) TABS Take 1 tablet by mouth daily.     Efgartigimod alfa-fcab (VYVGART IV) Inject into the vein every 30 (thirty) days.     Misc. Devices MISC by Does not apply route. CPAP     Multiple Vitamin (MULTIVITAMIN WITH MINERALS) TABS tablet Take 1 tablet by mouth daily.     mycophenolate (CELLCEPT) 500 MG tablet Take 3 tabs by mouth every morning & 2 tabs every evening     nitroGLYCERIN  (NITROSTAT ) 0.4 MG SL tablet Place 1 tablet (0.4 mg total) under the tongue every 5 (five) minutes x 3 doses as needed for chest pain. 25 tablet 2   predniSONE (DELTASONE) 10 MG tablet Take 10 mg by mouth daily.     pyridostigmine (MESTINON) 60 MG tablet Take 30 mg by mouth 2 (two) times daily.  spironolactone  (ALDACTONE ) 25 MG tablet Take 1 tablet (25 mg total) by mouth daily. 90 tablet 3   No current facility-administered medications for this visit.     Past Surgical History:  Procedure Laterality Date   CORONARY ATHERECTOMY N/A 12/20/2020   Procedure: CORONARY ATHERECTOMY;  Surgeon: Arty Binning, MD;  Location: [facility] INVASIVE CV LAB;  Service: Cardiovascular;  Laterality: N/A;   CORONARY ULTRASOUND/IVUS N/A 12/20/2020   Procedure: Intravascular Ultrasound/IVUS;  Surgeon: Arty Binning, MD;  Location: [facility] INVASIVE CV LAB;  Service: Cardiovascular;  Laterality: N/A;   LEFT HEART CATH AND CORONARY ANGIOGRAPHY N/A 12/19/2020   Procedure: LEFT HEART CATH AND CORONARY ANGIOGRAPHY;  Surgeon: Arty Binning, MD;  Location: [facility] INVASIVE CV LAB;  Service: Cardiovascular;  Laterality: N/A;     Allergies  Allergen Reactions   Penicillins     Mouth swells and gets short of breath      Family History  Problem Relation Age of Onset   Diabetes  Brother      Social History [name] reports that he quit smoking about 35 years ago. His smoking use included cigarettes. He started smoking about 50 years ago. He has a 15 pack-year smoking history. He has never been exposed to tobacco smoke. He has never used smokeless tobacco. [name] reports current alcohol use of about 10.0 standard drinks of alcohol per week.   Review of Systems CONSTITUTIONAL: No weight loss, fever, chills, weakness or fatigue.  HEENT: Eyes: No visual loss, blurred vision, double vision or yellow sclerae.No hearing loss, sneezing, congestion, runny nose or sore throat.  SKIN: No rash or itching.  CARDIOVASCULAR:  RESPIRATORY: No shortness of breath, cough or sputum.  GASTROINTESTINAL: No anorexia, nausea, vomiting or diarrhea. No abdominal pain or blood.  GENITOURINARY: No burning on urination, no polyuria NEUROLOGICAL: No headache, dizziness, syncope, paralysis, ataxia, numbness or tingling in the extremities. No change in bowel or bladder control.  MUSCULOSKELETAL: No muscle, back pain, joint pain or stiffness.  LYMPHATICS: No enlarged nodes. No history of splenectomy.  PSYCHIATRIC: No history of depression or anxiety.  ENDOCRINOLOGIC: No reports of sweating, cold or heat intolerance. No polyuria or polydipsia.  Nicholas Andersen   Physical Examination There were no vitals filed for this visit. There were no vitals filed for this visit.  Gen: resting comfortably, no acute distress HEENT: no scleral icterus, pupils equal round and reactive, no palptable cervical adenopathy,  CV Resp: Clear to auscultation bilaterally GI: abdomen is soft, non-tender, non-distended, normal bowel sounds, no hepatosplenomegaly MSK: extremities are warm, no edema.  Skin: warm, no rash Neuro:  no focal deficits Psych: appropriate affect   Diagnostic Studies     Assessment and Plan   CAD - no symptoms, continue current meds - no beta blocker due to bradycardia     2.  Hyperlipidemia -at goal, continue current meds     3. HTN -bp is at goal, continue current meds   4. Bradycardia - normal HRs typically 50s to 60s - he has pyridostigmine taking higher dose due to severe myasthenia flare, EKG today sinus brady 41 - no symptoms, at this time monitor. If develops symptoms would need to discuss with his neurologist lowering dose or possibly an alternative. - he already is not driving due to his double vision     Laurann Pollock, M.D.

## 2024-04-02 ENCOUNTER — Encounter: Payer: Self-pay | Admitting: Cardiology

## 2024-04-02 ENCOUNTER — Ambulatory Visit: Attending: Cardiology | Admitting: Cardiology

## 2024-04-02 ENCOUNTER — Encounter: Payer: Self-pay | Admitting: *Deleted

## 2024-04-02 VITALS — BP 118/80 | HR 67 | Ht 70.0 in | Wt 275.4 lb

## 2024-04-02 DIAGNOSIS — I2510 Atherosclerotic heart disease of native coronary artery without angina pectoris: Secondary | ICD-10-CM | POA: Diagnosis not present

## 2024-04-02 DIAGNOSIS — E782 Mixed hyperlipidemia: Secondary | ICD-10-CM | POA: Diagnosis not present

## 2024-04-02 DIAGNOSIS — I10 Essential (primary) hypertension: Secondary | ICD-10-CM | POA: Diagnosis not present

## 2024-04-02 NOTE — Progress Notes (Signed)
 Clinical Summary Mr. Bolds is a 63 y.o.male seen today for follow up of the following medical problems.        1.CAD/NSTEMI - admitted 12/2020 with NSTEMI - 12/2020 echo LVEF 50-55%, indet diastolic, normal rV - 12/2020 cath: LAD prox 85% and mid 50%, occldued mid LCX, prox RCA 60% - had orbtital atherectomy and DES to LAD - no beta blocker due to bradycardia - 04/2021 echo LVEF 60-65%, no WMAs    -changed brillinta to plavix  due to fatigue, though was unclear was the culprit at the time, though symptoms much improved with change.    -no chest pains, no SOB/DOE - compliant with meds      2.OSA -on cpap,  followed at [facility]     3. 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 Hyperlipdiemia - labs followed by pcp, we are requesting     6. Aortic sclerosis - has mild heart murmur on exam        SH: works in extreme temperatures at work, does heavy lifting. Works for [employer]. Retired in March, 2025. Currently on disability. Has a camper, goes out regularly.     Past Medical History:  Diagnosis Date   Asthma    Coronary artery disease    Gout    Hypercholesterolemia    Hypertension      Allergies  Allergen Reactions   Penicillins     Mouth swells and gets short of breath     Current Outpatient Medications  Medication Sig Dispense Refill   allopurinol  (ZYLOPRIM ) 100 MG tablet Take 100 mg by mouth daily.     amLODipine -benazepril  (LOTREL) 10-20 MG capsule Take 1 capsule by mouth daily.     aspirin  EC 81 MG tablet Take 81 mg by mouth daily. Swallow whole.     atorvastatin  (LIPITOR ) 80 MG tablet Take 80 mg by mouth daily.     baclofen (LIORESAL) 10 MG tablet Take 10 mg by mouth 2 (two) times daily as needed.     BREO ELLIPTA 200-25 MCG/ACT AEPB Inhale 1 puff into the lungs daily.     cetirizine (ZYRTEC) 10 MG tablet Take 10 mg by mouth as needed.     Cholecalciferol (VITAMIN D3) 50 MCG (2000 UT) TABS Take 1 tablet by  mouth daily.     Efgartigimod alfa-fcab (VYVGART IV) Inject into the vein every 30 (thirty) days.     Misc. Devices MISC by Does not apply route. CPAP     Multiple Vitamin (MULTIVITAMIN WITH MINERALS) TABS tablet Take 1 tablet by mouth daily.     mycophenolate (CELLCEPT) 500 MG tablet Take 3 tabs by mouth every morning & 2 tabs every evening     nitroGLYCERIN  (NITROSTAT ) 0.4 MG SL tablet Place 1 tablet (0.4 mg total) under the tongue every 5 (five) minutes x 3 doses as needed for chest pain. 25 tablet 2   predniSONE (DELTASONE) 10 MG tablet Take 10 mg by mouth daily.     pyridostigmine (MESTINON) 60 MG tablet Take 30 mg by mouth 2 (two) times daily.     spironolactone  (ALDACTONE ) 25 MG tablet Take 1 tablet (25 mg total) by mouth daily. 90 tablet 3   No current facility-administered medications for this visit.     Past Surgical History:  Procedure Laterality Date   CORONARY ATHERECTOMY N/A 12/20/2020   Procedure: CORONARY ATHERECTOMY;  Surgeon: Claudene Victory ORN, MD;  Location:  [facility] INVASIVE CV LAB;  Service: Cardiovascular;  Laterality: N/A;   CORONARY ULTRASOUND/IVUS N/A 12/20/2020   Procedure: Intravascular Ultrasound/IVUS;  Surgeon: Claudene Victory ORN, MD;  Location: [facility] INVASIVE CV LAB;  Service: Cardiovascular;  Laterality: N/A;   LEFT HEART CATH AND CORONARY ANGIOGRAPHY N/A 12/19/2020   Procedure: LEFT HEART CATH AND CORONARY ANGIOGRAPHY;  Surgeon: Claudene Victory ORN, MD;  Location: [facility] INVASIVE CV LAB;  Service: Cardiovascular;  Laterality: N/A;     Allergies  Allergen Reactions   Penicillins     Mouth swells and gets short of breath      Family History  Problem Relation Age of Onset   Diabetes Brother      Social History Mr. Eggebrecht reports that he quit smoking about 35 years ago. His smoking use included cigarettes. He started smoking about 50 years ago. He has a 15 pack-year smoking history. He has never been exposed to tobacco smoke. He has never used smokeless tobacco. Mr. Hazelip  reports current alcohol use of about 10.0 standard drinks of alcohol per week.   Review of Systems CONSTITUTIONAL: No weight loss, fever, chills, weakness or fatigue.  HEENT: Eyes: No visual loss, blurred vision, double vision or yellow sclerae.No hearing loss, sneezing, congestion, runny nose or sore throat.  SKIN: No rash or itching.  CARDIOVASCULAR:  RESPIRATORY: No shortness of breath, cough or sputum.  GASTROINTESTINAL: No anorexia, nausea, vomiting or diarrhea. No abdominal pain or blood.  GENITOURINARY: No burning on urination, no polyuria NEUROLOGICAL: No headache, dizziness, syncope, paralysis, ataxia, numbness or tingling in the extremities. No change in bowel or bladder control.  MUSCULOSKELETAL: No muscle, back pain, joint pain or stiffness.  LYMPHATICS: No enlarged nodes. No history of splenectomy.  PSYCHIATRIC: No history of depression or anxiety.  ENDOCRINOLOGIC: No reports of sweating, cold or heat intolerance. No polyuria or polydipsia.  SABRA   Physical Examination Today's Vitals   04/02/24 0929  BP: 118/80  Pulse: 67  SpO2: 95%  Weight: 275 lb 6.4 oz (124.9 kg)  Height: 5' 10 (1.778 m)   Body mass index is 39.52 kg/m.  Gen: resting comfortably, no acute distress HEENT: no scleral icterus, pupils equal round and reactive, no palptable cervical adenopathy,  CV: RRR, 2/6 systolic murmur rusb, no jvd Resp: Clear to auscultation bilaterally GI: abdomen is soft, non-tender, non-distended, normal bowel sounds, no hepatosplenomegaly MSK: extremities are warm, trace bilateral edema Skin: warm, no rash Neuro:  no focal deficits Psych: appropriate affect   Diagnostic Studies     Assessment and Plan    CAD - no beta blocker due to bradycardia - no symptoms, continue current meds - EKG shows SR, no acute ischemic changes     2. Hyperlipidemia -request pcp labs, continue current meds   3. HTN -at goal, continue current meds   F/u 6 months   Dorn PHEBE Ross, M.D.

## 2024-04-02 NOTE — Patient Instructions (Signed)
 Medication Instructions:  Continue all current medications.   Labwork: none  Testing/Procedures: none  Follow-Up: 6 months   Any Other Special Instructions Will Be Listed Below (If Applicable).   If you need a refill on your cardiac medications before your next appointment, please call your pharmacy.
